# Patient Record
Sex: Female | Born: 1935 | Race: White | Hispanic: No | Smoking: Never smoker
Health system: Southern US, Community
[De-identification: ages and names within clinical notes are randomized; demographics above are authoritative.]

## PROBLEM LIST (undated history)

## (undated) DIAGNOSIS — K219 Gastro-esophageal reflux disease without esophagitis: Secondary | ICD-10-CM

## (undated) DIAGNOSIS — IMO0001 Reserved for inherently not codable concepts without codable children: Secondary | ICD-10-CM

## (undated) DIAGNOSIS — E119 Type 2 diabetes mellitus without complications: Secondary | ICD-10-CM

## (undated) DIAGNOSIS — M069 Rheumatoid arthritis, unspecified: Secondary | ICD-10-CM

## (undated) DIAGNOSIS — J189 Pneumonia, unspecified organism: Secondary | ICD-10-CM

## (undated) DIAGNOSIS — E785 Hyperlipidemia, unspecified: Secondary | ICD-10-CM

## (undated) DIAGNOSIS — J8410 Pulmonary fibrosis, unspecified: Secondary | ICD-10-CM

## (undated) DIAGNOSIS — R5381 Other malaise: Secondary | ICD-10-CM

## (undated) DIAGNOSIS — D649 Anemia, unspecified: Secondary | ICD-10-CM

## (undated) DIAGNOSIS — K5792 Diverticulitis of intestine, part unspecified, without perforation or abscess without bleeding: Secondary | ICD-10-CM

## (undated) DIAGNOSIS — I2510 Atherosclerotic heart disease of native coronary artery without angina pectoris: Secondary | ICD-10-CM

## (undated) DIAGNOSIS — I10 Essential (primary) hypertension: Secondary | ICD-10-CM

## (undated) DIAGNOSIS — I639 Cerebral infarction, unspecified: Secondary | ICD-10-CM

## (undated) DIAGNOSIS — I219 Acute myocardial infarction, unspecified: Secondary | ICD-10-CM

## (undated) DIAGNOSIS — I729 Aneurysm of unspecified site: Secondary | ICD-10-CM

## (undated) DIAGNOSIS — Z5189 Encounter for other specified aftercare: Secondary | ICD-10-CM

## (undated) HISTORY — DX: Pulmonary fibrosis, unspecified: J84.10

## (undated) HISTORY — PX: HERNIA REPAIR: SHX51

## (undated) HISTORY — PX: ABDOMINAL HYSTERECTOMY: SHX81

## (undated) HISTORY — DX: Diverticulitis of intestine, part unspecified, without perforation or abscess without bleeding: K57.92

## (undated) HISTORY — DX: Gastro-esophageal reflux disease without esophagitis: K21.9

## (undated) HISTORY — PX: BREAST REDUCTION SURGERY: SHX8

## (undated) HISTORY — DX: Type 2 diabetes mellitus without complications: E11.9

## (undated) HISTORY — PX: BREAST SURGERY: SHX581

## (undated) HISTORY — PX: CORONARY ANGIOPLASTY WITH STENT PLACEMENT: SHX49

## (undated) HISTORY — DX: Cerebral infarction, unspecified: I63.9

## (undated) HISTORY — DX: Other malaise: R53.81

## (undated) HISTORY — PX: APPENDECTOMY: SHX54

## (undated) HISTORY — PX: CATARACT EXTRACTION W/ INTRAOCULAR LENS  IMPLANT, BILATERAL: SHX1307

## (undated) HISTORY — PX: VESICOVAGINAL FISTULA CLOSURE W/ TAH: SUR271

## (undated) HISTORY — DX: Essential (primary) hypertension: I10

## (undated) HISTORY — PX: CARPAL TUNNEL RELEASE: SHX101

## (undated) HISTORY — DX: Atherosclerotic heart disease of native coronary artery without angina pectoris: I25.10

## (undated) HISTORY — DX: Rheumatoid arthritis, unspecified: M06.9

## (undated) HISTORY — PX: TONSILLECTOMY: SUR1361

## (undated) HISTORY — PX: CHOLECYSTECTOMY: SHX55

## (undated) HISTORY — DX: Hyperlipidemia, unspecified: E78.5

---

## 1996-09-28 HISTORY — PX: CORONARY ARTERY BYPASS GRAFT: SHX141

## 1997-11-27 ENCOUNTER — Encounter: Payer: Self-pay | Admitting: Cardiology

## 1999-11-24 ENCOUNTER — Encounter: Admission: RE | Admit: 1999-11-24 | Discharge: 1999-11-24 | Payer: Self-pay | Admitting: Family Medicine

## 1999-11-24 ENCOUNTER — Encounter: Payer: Self-pay | Admitting: Family Medicine

## 2000-05-04 ENCOUNTER — Ambulatory Visit (HOSPITAL_COMMUNITY): Admission: RE | Admit: 2000-05-04 | Discharge: 2000-05-04 | Payer: Self-pay | Admitting: Cardiology

## 2001-11-08 ENCOUNTER — Encounter: Payer: Self-pay | Admitting: Cardiology

## 2003-05-22 ENCOUNTER — Encounter: Payer: Self-pay | Admitting: *Deleted

## 2003-05-22 ENCOUNTER — Emergency Department (HOSPITAL_COMMUNITY): Admission: EM | Admit: 2003-05-22 | Discharge: 2003-05-22 | Payer: Self-pay | Admitting: *Deleted

## 2003-06-08 ENCOUNTER — Encounter: Payer: Self-pay | Admitting: Neurology

## 2003-06-08 ENCOUNTER — Encounter: Admission: RE | Admit: 2003-06-08 | Discharge: 2003-06-08 | Payer: Self-pay | Admitting: Neurology

## 2003-06-22 ENCOUNTER — Encounter: Payer: Self-pay | Admitting: Diagnostic Radiology

## 2003-06-22 ENCOUNTER — Encounter: Payer: Self-pay | Admitting: Neurology

## 2003-06-22 ENCOUNTER — Encounter: Admission: RE | Admit: 2003-06-22 | Discharge: 2003-06-22 | Payer: Self-pay | Admitting: Neurology

## 2003-07-10 ENCOUNTER — Encounter: Admission: RE | Admit: 2003-07-10 | Discharge: 2003-07-10 | Payer: Self-pay | Admitting: Neurology

## 2003-07-10 ENCOUNTER — Encounter: Payer: Self-pay | Admitting: Neurology

## 2004-05-03 ENCOUNTER — Inpatient Hospital Stay (HOSPITAL_COMMUNITY): Admission: EM | Admit: 2004-05-03 | Discharge: 2004-05-07 | Payer: Self-pay | Admitting: Emergency Medicine

## 2005-04-04 ENCOUNTER — Encounter: Admission: RE | Admit: 2005-04-04 | Discharge: 2005-04-04 | Payer: Self-pay | Admitting: Neurology

## 2005-05-19 ENCOUNTER — Ambulatory Visit (HOSPITAL_COMMUNITY): Admission: RE | Admit: 2005-05-19 | Discharge: 2005-05-19 | Payer: Self-pay | Admitting: Anesthesiology

## 2005-09-14 ENCOUNTER — Encounter: Admission: RE | Admit: 2005-09-14 | Discharge: 2005-09-14 | Payer: Self-pay | Admitting: Neurology

## 2006-09-09 ENCOUNTER — Encounter: Payer: Self-pay | Admitting: Cardiology

## 2006-09-09 ENCOUNTER — Encounter: Payer: Self-pay | Admitting: Critical Care Medicine

## 2006-09-28 DIAGNOSIS — K5792 Diverticulitis of intestine, part unspecified, without perforation or abscess without bleeding: Secondary | ICD-10-CM

## 2006-09-28 HISTORY — DX: Diverticulitis of intestine, part unspecified, without perforation or abscess without bleeding: K57.92

## 2008-03-02 ENCOUNTER — Encounter: Payer: Self-pay | Admitting: Critical Care Medicine

## 2008-03-13 ENCOUNTER — Telehealth: Payer: Self-pay | Admitting: Critical Care Medicine

## 2008-03-14 ENCOUNTER — Ambulatory Visit: Payer: Self-pay | Admitting: Critical Care Medicine

## 2008-03-14 DIAGNOSIS — M069 Rheumatoid arthritis, unspecified: Secondary | ICD-10-CM | POA: Insufficient documentation

## 2008-03-14 DIAGNOSIS — E785 Hyperlipidemia, unspecified: Secondary | ICD-10-CM

## 2008-03-14 DIAGNOSIS — J8410 Pulmonary fibrosis, unspecified: Secondary | ICD-10-CM | POA: Insufficient documentation

## 2008-03-14 DIAGNOSIS — I10 Essential (primary) hypertension: Secondary | ICD-10-CM | POA: Insufficient documentation

## 2008-03-14 DIAGNOSIS — I2510 Atherosclerotic heart disease of native coronary artery without angina pectoris: Secondary | ICD-10-CM | POA: Insufficient documentation

## 2008-03-16 ENCOUNTER — Ambulatory Visit: Payer: Self-pay | Admitting: Internal Medicine

## 2008-03-17 ENCOUNTER — Encounter: Payer: Self-pay | Admitting: Critical Care Medicine

## 2008-03-19 ENCOUNTER — Ambulatory Visit: Admission: RE | Admit: 2008-03-19 | Discharge: 2008-03-19 | Payer: Self-pay | Admitting: Critical Care Medicine

## 2008-03-19 ENCOUNTER — Telehealth (INDEPENDENT_AMBULATORY_CARE_PROVIDER_SITE_OTHER): Payer: Self-pay | Admitting: *Deleted

## 2008-03-19 ENCOUNTER — Telehealth: Payer: Self-pay | Admitting: Critical Care Medicine

## 2008-03-20 ENCOUNTER — Encounter: Payer: Self-pay | Admitting: Critical Care Medicine

## 2008-03-21 ENCOUNTER — Ambulatory Visit: Admission: RE | Admit: 2008-03-21 | Discharge: 2008-03-21 | Payer: Self-pay | Admitting: Critical Care Medicine

## 2008-03-21 ENCOUNTER — Ambulatory Visit: Payer: Self-pay | Admitting: Critical Care Medicine

## 2008-03-21 ENCOUNTER — Encounter: Payer: Self-pay | Admitting: Critical Care Medicine

## 2008-03-26 ENCOUNTER — Telehealth (INDEPENDENT_AMBULATORY_CARE_PROVIDER_SITE_OTHER): Payer: Self-pay | Admitting: *Deleted

## 2008-03-26 ENCOUNTER — Ambulatory Visit: Payer: Self-pay | Admitting: Critical Care Medicine

## 2008-03-29 ENCOUNTER — Ambulatory Visit: Payer: Self-pay | Admitting: Critical Care Medicine

## 2008-04-01 LAB — CONVERTED CEMR LAB: Angiotensin 1 Converting Enzyme: 25 units/L (ref 9–67)

## 2008-04-10 ENCOUNTER — Encounter: Payer: Self-pay | Admitting: Critical Care Medicine

## 2008-06-12 ENCOUNTER — Encounter: Payer: Self-pay | Admitting: Critical Care Medicine

## 2008-07-04 ENCOUNTER — Ambulatory Visit: Payer: Self-pay | Admitting: Critical Care Medicine

## 2008-07-04 DIAGNOSIS — K219 Gastro-esophageal reflux disease without esophagitis: Secondary | ICD-10-CM

## 2008-09-06 ENCOUNTER — Ambulatory Visit: Payer: Self-pay | Admitting: Critical Care Medicine

## 2008-10-11 ENCOUNTER — Encounter: Payer: Self-pay | Admitting: Critical Care Medicine

## 2009-01-24 ENCOUNTER — Ambulatory Visit: Payer: Self-pay | Admitting: Critical Care Medicine

## 2009-05-02 ENCOUNTER — Ambulatory Visit: Payer: Self-pay | Admitting: Critical Care Medicine

## 2009-06-06 ENCOUNTER — Ambulatory Visit (HOSPITAL_BASED_OUTPATIENT_CLINIC_OR_DEPARTMENT_OTHER): Admission: RE | Admit: 2009-06-06 | Discharge: 2009-06-06 | Payer: Self-pay | Admitting: Critical Care Medicine

## 2009-06-06 ENCOUNTER — Ambulatory Visit: Payer: Self-pay | Admitting: Critical Care Medicine

## 2009-06-06 ENCOUNTER — Ambulatory Visit: Payer: Self-pay | Admitting: Diagnostic Radiology

## 2009-06-06 DIAGNOSIS — J209 Acute bronchitis, unspecified: Secondary | ICD-10-CM

## 2009-07-04 ENCOUNTER — Ambulatory Visit: Payer: Self-pay | Admitting: Critical Care Medicine

## 2009-07-17 ENCOUNTER — Telehealth: Payer: Self-pay | Admitting: Critical Care Medicine

## 2009-07-19 ENCOUNTER — Emergency Department (HOSPITAL_BASED_OUTPATIENT_CLINIC_OR_DEPARTMENT_OTHER): Admission: EM | Admit: 2009-07-19 | Discharge: 2009-07-19 | Payer: Self-pay | Admitting: Emergency Medicine

## 2009-07-19 ENCOUNTER — Ambulatory Visit: Payer: Self-pay | Admitting: Radiology

## 2009-09-09 ENCOUNTER — Encounter: Admission: RE | Admit: 2009-09-09 | Discharge: 2009-09-09 | Payer: Self-pay | Admitting: Otolaryngology

## 2009-10-24 ENCOUNTER — Encounter: Payer: Self-pay | Admitting: Cardiology

## 2009-11-07 ENCOUNTER — Ambulatory Visit: Payer: Self-pay | Admitting: Critical Care Medicine

## 2010-01-07 ENCOUNTER — Emergency Department (HOSPITAL_BASED_OUTPATIENT_CLINIC_OR_DEPARTMENT_OTHER): Admission: EM | Admit: 2010-01-07 | Discharge: 2010-01-07 | Payer: Self-pay | Admitting: Emergency Medicine

## 2010-01-07 ENCOUNTER — Ambulatory Visit: Payer: Self-pay | Admitting: Radiology

## 2010-03-13 ENCOUNTER — Ambulatory Visit: Payer: Self-pay | Admitting: Critical Care Medicine

## 2010-03-13 DIAGNOSIS — E119 Type 2 diabetes mellitus without complications: Secondary | ICD-10-CM

## 2010-04-25 ENCOUNTER — Encounter: Admission: RE | Admit: 2010-04-25 | Discharge: 2010-04-25 | Payer: Self-pay | Admitting: Family Medicine

## 2010-04-25 DIAGNOSIS — I639 Cerebral infarction, unspecified: Secondary | ICD-10-CM

## 2010-04-25 HISTORY — DX: Cerebral infarction, unspecified: I63.9

## 2010-05-01 ENCOUNTER — Telehealth (INDEPENDENT_AMBULATORY_CARE_PROVIDER_SITE_OTHER): Payer: Self-pay | Admitting: *Deleted

## 2010-05-01 ENCOUNTER — Ambulatory Visit: Payer: Self-pay | Admitting: Critical Care Medicine

## 2010-05-01 DIAGNOSIS — I6789 Other cerebrovascular disease: Secondary | ICD-10-CM

## 2010-05-26 ENCOUNTER — Encounter: Payer: Self-pay | Admitting: Cardiology

## 2010-05-26 ENCOUNTER — Ambulatory Visit: Payer: Self-pay | Admitting: Cardiology

## 2010-06-11 ENCOUNTER — Encounter: Payer: Self-pay | Admitting: Cardiology

## 2010-06-11 ENCOUNTER — Ambulatory Visit (HOSPITAL_COMMUNITY): Admission: RE | Admit: 2010-06-11 | Discharge: 2010-06-11 | Payer: Self-pay | Admitting: Cardiology

## 2010-06-11 ENCOUNTER — Ambulatory Visit: Payer: Self-pay | Admitting: Cardiology

## 2010-07-03 ENCOUNTER — Ambulatory Visit: Payer: Self-pay | Admitting: Critical Care Medicine

## 2010-07-24 ENCOUNTER — Encounter: Payer: Self-pay | Admitting: Critical Care Medicine

## 2010-09-01 ENCOUNTER — Encounter: Payer: Self-pay | Admitting: Critical Care Medicine

## 2010-10-02 ENCOUNTER — Ambulatory Visit
Admission: RE | Admit: 2010-10-02 | Discharge: 2010-10-02 | Payer: Self-pay | Source: Home / Self Care | Attending: Critical Care Medicine | Admitting: Critical Care Medicine

## 2010-10-28 DIAGNOSIS — I219 Acute myocardial infarction, unspecified: Secondary | ICD-10-CM | POA: Insufficient documentation

## 2010-10-28 DIAGNOSIS — M129 Arthropathy, unspecified: Secondary | ICD-10-CM | POA: Insufficient documentation

## 2010-10-29 ENCOUNTER — Ambulatory Visit (INDEPENDENT_AMBULATORY_CARE_PROVIDER_SITE_OTHER): Payer: Medicare Other | Admitting: Cardiology

## 2010-10-29 ENCOUNTER — Encounter: Payer: Self-pay | Admitting: Cardiology

## 2010-10-29 ENCOUNTER — Ambulatory Visit: Admit: 2010-10-29 | Payer: Self-pay | Admitting: Cardiology

## 2010-10-29 DIAGNOSIS — I2510 Atherosclerotic heart disease of native coronary artery without angina pectoris: Secondary | ICD-10-CM

## 2010-10-29 DIAGNOSIS — I10 Essential (primary) hypertension: Secondary | ICD-10-CM

## 2010-10-30 ENCOUNTER — Encounter (INDEPENDENT_AMBULATORY_CARE_PROVIDER_SITE_OTHER): Payer: Self-pay | Admitting: *Deleted

## 2010-10-30 ENCOUNTER — Telehealth (INDEPENDENT_AMBULATORY_CARE_PROVIDER_SITE_OTHER): Payer: Self-pay | Admitting: *Deleted

## 2010-10-30 ENCOUNTER — Encounter: Payer: Self-pay | Admitting: Cardiology

## 2010-10-30 LAB — CONVERTED CEMR LAB
HDL: 45 mg/dL (ref >39)
Indirect Bilirubin: 0.4 mg/dL (ref 0.0–0.9)
LDL Cholesterol: 66 mg/dL (ref 0–99)
Total Bilirubin: 0.5 mg/dL (ref 0.3–1.2)
Total CHOL/HDL Ratio: 3.2
Total Protein: 7.2 g/dL (ref 6.0–8.3)
VLDL: 33 mg/dL (ref 0–40)

## 2010-10-30 NOTE — Assessment & Plan Note (Signed)
Summary: Pulmonary OV   Copy to:  Zimenski: Rheumatology Primary Provider/Referring Provider:  Thomasene Mohair  CC:  4 month follow up.  Pt states breathing is "very difficult."  States she is having increased SOB with activity-this is worse since last OV.  Also states she does have a cough-sometimes prod with white mucus.  Denies wheezing and chest tightness.  Marland Kitchen  History of Present Illness: Pulmonary  F/U:   Re: pulmonary infiltrates due to RA  [name] This pt was originally seen in consultation on 03/14/08 for interstital changes on CT chest  in setting of RA.  Has RA induced interstitial lung disease. Past Hx pertinent for severe Rheumatoid arthritis Dx 9yrs ago.  Rx with Leflunamide.  Not on methotrexate.     March 13, 2010 9:34 AM The pt feels she is worsening.  There is  more difficulty breathing and coughing more. The cough is productive of minimal white mucus.  The pt hurts all the time in all the joints.  The joints are swelling more.  The pt did benefit from embrel but could not afford.  The pt is more fatigued.  When the pt  gets up the pt  is tired.  No real chest pain.  Mucus is minimal.  Arthritis is worse.  Hurts all the time.  Joints swell. Was on embrel but could not afford.   Preventive Screening-Counseling & Management  Alcohol-Tobacco     Smoking Status: never  Current Medications (verified): 1)  Metoprolol Succinate 50 Mg  Tb24 (Metoprolol Succinate) .... Once Daily 2)  Plavix 75 Mg  Tabs (Clopidogrel Bisulfate) .... Once Daily 3)  Adult Aspirin Low Strength 81 Mg  Tbdp (Aspirin) .... Once Daily 4)  Leflunomide 20 Mg  Tabs (Leflunomide) .... Once Daily 5)  Crestor 20 Mg  Tabs (Rosuvastatin Calcium) .... Once Daily 6)  Centrum Silver   Chew (Multiple Vitamins-Minerals) .... Once Daily 7)  Fish Oil   Oil (Fish Oil) .... Once Daily 8)  Symbicort 160-4.5 Mcg/act  Aero (Budesonide-Formoterol Fumarate) .... Two Puffs Twice Daily 9)  Glucophage 500 Mg Tabs (Metformin Hcl)  .... 2 Tablets Two Times A Day  Allergies (verified): No Known Drug Allergies  Past History:  Past medical, surgical, family and social histories (including risk factors) reviewed, and no changes noted (except as noted below).  Past Medical History: Reviewed history from 07/04/2008 and no changes required. Coronary Heart Disease Hypertension Hyperlipidemia Rheumatoid Arthritis on Arava.  Dx for 37yrs.  pulmonary fibrosis due to RA    -TLC 79%  DLCO 93% 2009  Past Surgical History: Reviewed history from 03/14/2008 and no changes required. Breast reduction gallbladder appendectomy C A B G:1998 cardiac stents hernia repair umbilical  Family History: Reviewed history from 03/14/2008 and no changes required. heart disease--mother father 5 sisters 5 brothers  Social History: Reviewed history from 03/14/2008 and no changes required. widowed children retired from [employer]  Review of Systems       The patient complains of shortness of breath with activity, productive cough, and non-productive cough.  The patient denies shortness of breath at rest, coughing up blood, chest pain, irregular heartbeats, acid heartburn, indigestion, loss of appetite, weight change, abdominal pain, difficulty swallowing, sore throat, tooth/dental problems, headaches, nasal congestion/difficulty breathing through nose, sneezing, itching, ear ache, anxiety, depression, hand/feet swelling, joint stiffness or pain, rash, change in color of mucus, and fever.    Vital Signs:  Patient profile:   77 year old female Height:  60 inches Weight:      133 pounds BMI:     26.07 O2 Sat:      95 % on Room air Temp:     97.9 degrees F oral Pulse rate:   70 / minute BP sitting:   118 / 70  (left arm) Cuff size:   regular  Vitals Entered By: Gweneth Dimitri RN (March 13, 2010 9:22 AM)  O2 Flow:  Room air  CC: 4 month follow up.  Pt states breathing is "very difficult."  States she is having increased  SOB with activity-this is worse since last OV.  Also states she does have a cough-sometimes prod with white mucus.  Denies wheezing and chest tightness.   Comments Medications reviewed with patient Daytime contact number verified with patient. Gweneth Dimitri RN  March 13, 2010 9:24 AM    Physical Exam  Additional Exam:  Gen: WD WN     WF     in NAD    NCAT Heent:  no jvd, no TMG, no cervical LNademopathy, orophyx clear,  nares with clear watery drainage. Cor: RRR nl s1/s2  no s3/s4  no m r h g Abd: soft NT BSA   no masses  No HSM  no rebound or guarding Ext perfused with no c v e v.d Neuro: intact, moves all 4s, CN II-XII intact, DTRs intact Chest:   no wheezes, no  rhonchi   no egophony  no consolidative breath sounds,  progressive bibasilar dry rales , exp wheeze  Skin: clear  Genital/Rectal :deferred    Impression & Recommendations:  Problem # 1:  PULMONARY FIBROSIS, POSTINFLAMMATORY (ICD-515) Assessment Deteriorated progressive pulmonary fibrosis due to RA, failed symbicort,  cannot afford embrel plan Start prednisone 10mg  4 each am x 4 days, 3 x 4 days, 2 x 4 days, 1 x 4 days then stop When current symbicort runs out, trial Dulera 200 two puff twice daily Return 6 weeks [place]  Medications Added to Medication List This Visit: 1)  Dulera 200-5 Mcg/act Aero (Mometasone furo-formoterol fum) .... 2 puffs twice per day 2)  Glucophage 500 Mg Tabs (Metformin hcl) .... 2 tablets two times a day 3)  Prednisone 10 Mg Tabs (Prednisone) .... Take as directed 4 each am x 4 days, 3 x 4 days, 2 x 4 days, 1 x 4 days then stop  Complete Medication List: 1)  Metoprolol Succinate 50 Mg Tb24 (Metoprolol succinate) .... Once daily 2)  Plavix 75 Mg Tabs (Clopidogrel bisulfate) .... Once daily 3)  Adult Aspirin Low Strength 81 Mg Tbdp (Aspirin) .... Once daily 4)  Leflunomide 20 Mg Tabs (Leflunomide) .... Once daily 5)  Crestor 20 Mg Tabs (Rosuvastatin calcium) .... Once daily 6)  Centrum  Silver Chew (Multiple vitamins-minerals) .... Once daily 7)  Fish Oil Oil (Fish oil) .... Once daily 8)  Dulera 200-5 Mcg/act Aero (Mometasone furo-formoterol fum) .... 2 puffs twice per day 9)  Glucophage 500 Mg Tabs (Metformin hcl) .... 2 tablets two times a day 10)  Prednisone 10 Mg Tabs (Prednisone) .... Take as directed 4 each am x 4 days, 3 x 4 days, 2 x 4 days, 1 x 4 days then stop  Other Orders: Est. Patient Level IV (62130)  Patient Instructions: 1)  Start prednisone 10mg  4 each am x 4 days, 3 x 4 days, 2 x 4 days, 1 x 4 days then stop 2)  When current symbicort runs out, trial Dulera 200 two puff twice daily 3)  Return 6 weeks [place] Prescriptions: PREDNISONE 10 MG  TABS (PREDNISONE) Take as directed 4 each am x 4 days, 3 x 4 days, 2 x 4 days, 1 x 4 days then stop  #40 x 0   Entered and Authorized by:   Storm Frisk MD   Signed by:   Storm Frisk MD on 03/13/2010   Method used:   Electronically to        [facility] [id number] (retail)       [address]       [place], [place]  [address]       Ph: [phone] or [phone]       Fax: [phone]   RxID:   [id number]     Appended Document: Pulmonary OV fax Miguel Aschoff,  Dr Janetta Hora

## 2010-10-30 NOTE — Assessment & Plan Note (Signed)
Summary: Pulmonary OV   Copy to:  Peter [place] Primary Provider/Referring Provider:  Thomasene Mohair  CC:  4 month follow up.  c/o "having a hard time taking a full breath" x1wk.  also having cough with white mucus and runny nose x1wk.  Marland Kitchen  History of Present Illness: Pulmonary  F/U:   Re: pulmonary infiltrates due to RA  [name] This pt was originally seen in consultation on 03/14/08 for interstital changes on CT chest  in setting of RA.  Has RA induced interstitial lung disease. Past Hx pertinent for severe Rheumatoid arthritis Dx 44yrs ago.  Rx with Leflunamide.  Not on methotrexate.    November 07, 2009 10:20 AM Still with the cough and pndrip. Dyspnea is the same.   No chest pain.   Cough is dry mostly, occ prod of mucous and is white.  The symbicort has helped. The arthritis is doing poorly on the [place].   Hips , feet and knees.      Preventive Screening-Counseling & Management  Alcohol-Tobacco     Smoking Status: never  Current Medications (verified): 1)  Metoprolol Succinate 50 Mg  Tb24 (Metoprolol Succinate) .... Once Daily 2)  Plavix 75 Mg  Tabs (Clopidogrel Bisulfate) .... Once Daily 3)  Adult Aspirin Low Strength 81 Mg  Tbdp (Aspirin) .... Once Daily 4)  Leflunomide 20 Mg  Tabs (Leflunomide) .... Once Daily 5)  Crestor 20 Mg  Tabs (Rosuvastatin Calcium) .... Once Daily 6)  Centrum Silver   Chew (Multiple Vitamins-Minerals) .... Once Daily 7)  Fish Oil   Oil (Fish Oil) .... Once Daily 8)  Symbicort 160-4.5 Mcg/act  Aero (Budesonide-Formoterol Fumarate) .... Two Puffs Twice Daily  Allergies (verified): No Known Drug Allergies  Past History:  Past medical, surgical, family and social histories (including risk factors) reviewed, and no changes noted (except as noted below).  Past Medical History: Reviewed history from 07/04/2008 and no changes required. Coronary Heart Disease Hypertension Hyperlipidemia Rheumatoid Arthritis on Arava.  Dx for 64yrs.  pulmonary  fibrosis due to RA    -TLC 79%  DLCO 93% 2009  Past Surgical History: Reviewed history from 03/14/2008 and no changes required. Breast reduction gallbladder appendectomy C A B G:1998 cardiac stents hernia repair umbilical  Family History: Reviewed history from 03/14/2008 and no changes required. heart disease--mother father 5 sisters 5 brothers  Social History: Reviewed history from 03/14/2008 and no changes required. widowed children retired from [employer]Smoking Status:  never  Review of Systems       The patient complains of shortness of breath with activity, non-productive cough, and nasal congestion/difficulty breathing through nose.  The patient denies shortness of breath at rest, productive cough, coughing up blood, chest pain, irregular heartbeats, acid heartburn, indigestion, loss of appetite, weight change, abdominal pain, difficulty swallowing, sore throat, tooth/dental problems, headaches, sneezing, itching, ear ache, anxiety, depression, hand/feet swelling, joint stiffness or pain, rash, change in color of mucus, and fever.    Vital Signs:  Patient profile:   77 year old female Height:      60 inches Weight:      138 pounds BMI:     27.05 O2 Sat:      93 % on Room air Temp:     98.1 degrees F oral Pulse rate:   88 / minute BP sitting:   132 / 78  (left arm) Cuff size:   regular  Vitals Entered By: Gweneth Dimitri RN (November 07, 2009 10:12 AM)  O2  Flow:  Room air CC: 4 month follow up.  c/o "having a hard time taking a full breath" x1wk.  also having cough with white mucus and runny nose x1wk.   Comments Medications reviewed with patient Daytime contact number verified with patient. Crystal Jones RN  November 07, 2009 10:13 AM    Physical Exam  Additional Exam:  Gen: WD WN     WF     in NAD    NCAT Heent:  no jvd, no TMG, no cervical LNademopathy, orophyx clear,  nares with clear watery drainage. Cor: RRR nl s1/s2  no s3/s4  no m r h g Abd: soft  NT BSA   no masses  No HSM  no rebound or guarding Ext perfused with no c v e v.d Neuro: intact, moves all 4s, CN II-XII intact, DTRs intact Chest:   no wheezes, bibasilar dry  rales, rhonchi   no egophony  no consolidative breath sounds, Skin: clear  Genital/Rectal :deferred    Impression & Recommendations:  Problem # 1:  PULMONARY FIBROSIS, POSTINFLAMMATORY (ICD-515) Assessment Unchanged  Pulmonary fibrosis primarily peripheral involvement,  TBBX insufficient to give DX.  PFTs show peripheral airflow obstruction. TLC and DLCO well preserved and stable, improved with laba/ics  Plan: no further prednisone ok to continue ARAVA. cont laba/ics symbicort  Complete Medication List: 1)  Metoprolol Succinate 50 Mg Tb24 (Metoprolol succinate) .... Once daily 2)  Plavix 75 Mg Tabs (Clopidogrel bisulfate) .... Once daily 3)  Adult Aspirin Low Strength 81 Mg Tbdp (Aspirin) .... Once daily 4)  Leflunomide 20 Mg Tabs (Leflunomide) .... Once daily 5)  Crestor 20 Mg Tabs (Rosuvastatin calcium) .... Once daily 6)  Centrum Silver Chew (Multiple vitamins-minerals) .... Once daily 7)  Fish Oil Oil (Fish oil) .... Once daily 8)  Symbicort 160-4.5 Mcg/act Aero (Budesonide-formoterol fumarate) .... Two puffs twice daily  Other Orders: Est. Patient Level III (16109)  Patient Instructions: 1)  No change in medications 2)  Return 4 months  Appended Document: Pulmonary OV fax allan ross

## 2010-10-30 NOTE — Assessment & Plan Note (Signed)
Summary: Pulmonary OV   Copy to:  Zimenski: Rheumatology Primary Provider/Referring Provider:  Thomasene Mohair  CC:  2 month follow up.  Pt states breathing is "pretty good most of the time" but will occasionally have a hard time getting a deep breath.  Cough - occ prod with creamy mucus.  Denies wheezing and chest tightness.  Needs refills for dulera.  History of Present Illness: Pulmonary  F/U:   Re: pulmonary infiltrates due to RA  [name] This pt was originally seen in consultation on 03/14/08 for interstital changes on CT chest  in setting of RA.  Has RA induced interstitial lung disease. Past Hx pertinent for severe Rheumatoid arthritis Dx 23yrs ago.  Rx with Leflunamide.  Not on methotrexate.     March 13, 2010 9:34 AM The pt feels she is worsening.  There is  more difficulty breathing and coughing more. The cough is productive of minimal white mucus.  The pt hurts all the time in all the joints.  The joints are swelling more.  The pt did benefit from embrel but could not afford.  The pt is more fatigued.  When the pt  gets up the pt  is tired.  No real chest pain.  Mucus is minimal.  Arthritis is worse.  Hurts all the time.  Joints swell. Was on embrel but could not afford.   May 01, 2010 10:21 AM Developed a sinus infection,  then developed a stroke:  confusion, memory issues, disequilibrium. Was not hospitalized.  Two weeks went by and saw PCP and he Dx this.  Had an MRI of the head. Pt is is still on an Abx for the sinuses:  cough, nose is draining and runs, voice is lost.  Cough is productive whitish yellow.   Is now off ASA and plavix and is on new med for CVA.  Pt to call back with the names. Pt is on Dulera and this helps the breathing better than the symbicort did.   July 03, 2010 10:44 AM The dry cough is a major issue. The pts  eyes have improved.  no more sinus infxn.  The pt  still gets dizzy, falls easily. Pt has a cane but does not use the cane.   Still on dulera and  this has helped    Current Medications (verified): 1)  Metoprolol Succinate 50 Mg  Tb24 (Metoprolol Succinate) .... Take 1 Tablet By Mouth Two Times A Day 2)  Leflunomide 20 Mg  Tabs (Leflunomide) .... Once Daily 3)  Crestor 20 Mg  Tabs (Rosuvastatin Calcium) .... Once Daily 4)  Centrum Silver   Chew (Multiple Vitamins-Minerals) .... Once Daily 5)  Fish Oil 1200 Mg Caps (Omega-3 Fatty Acids) .... Take 1 Capsule By Mouth Once A Day 6)  Dulera 200-5 Mcg/act Aero (Mometasone Furo-Formoterol Fum) .... 2 Puffs Twice Per Day 7)  Glucophage 500 Mg Tabs (Metformin Hcl) .Marland Kitchen.. 1-2 Tablets Two Times A Day 8)  Aggrenox 25-200 Mg Xr12h-Cap (Aspirin-Dipyridamole) .... Take 1 Tablet By Mouth Two Times A Day 9)  Synthroid 50 Mcg Tabs (Levothyroxine Sodium) .... Take 1 Tab By Mouth Once A Day Each Morning On An Empty Stomach.  Allergies (verified): 1)  Augmentin  Past History:  Past medical, surgical, family and social histories (including risk factors) reviewed, and no changes noted (except as noted below).  Past Medical History: Reviewed history from 05/01/2010 and no changes required. Coronary Heart Disease Hypertension Hyperlipidemia Rheumatoid Arthritis on Arava.  Dx for 39yrs.  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 Breast reduction gallbladder appendectomy C A B G:1998 cardiac stents hernia repair umbilical  Family History: Reviewed history from 03/14/2008 and no changes required. heart disease--mother father 5 sisters 5 brothers  Social History: Reviewed history from 05/01/2010 and no changes required. widowed children retired from [employer] Never smoked  Review of Systems       The patient complains of shortness of breath with activity and non-productive cough.  The patient denies shortness of breath at rest,  productive cough, coughing up blood, chest pain, irregular heartbeats, acid heartburn, indigestion, loss of appetite, weight change, abdominal pain, difficulty swallowing, sore throat, tooth/dental problems, headaches, nasal congestion/difficulty breathing through nose, sneezing, itching, ear ache, anxiety, depression, hand/feet swelling, joint stiffness or pain, rash, change in color of mucus, and fever.    Vital Signs:  Patient profile:   77 year old female Height:      60 inches Weight:      122 pounds BMI:     23.91 O2 Sat:      97 % on Room air Temp:     97.9 degrees F oral Pulse rate:   73 / minute BP sitting:   120 / 80  (left arm) Cuff size:   regular  Vitals Entered By: Gweneth Dimitri RN (July 03, 2010 10:21 AM)  O2 Flow:  Room air CC: 2 month follow up.  Pt states breathing is "pretty good most of the time" but will occasionally have a hard time getting a deep breath.  Cough - occ prod with creamy mucus.  Denies wheezing and chest tightness.  Needs refills for dulera Comments Medications reviewed with patient Daytime contact number verified with patient. Crystal Jones RN  July 03, 2010 10:22 AM    Physical Exam  Additional Exam:  Gen: WD WN     WF     in NAD    NCAT Heent:  no jvd, no TMG, no cervical LNademopathy, orophyx clear,  nares with clear watery drainage. Cor: RRR nl s1/s2  no s3/s4  no m r h g Abd: soft NT BSA   no masses  No HSM  no rebound or guarding Ext perfused with no c v e v.d Neuro: intact, moves all 4s, CN II-XII intact, DTRs intact Chest:   no wheezes, no  rhonchi   no egophony  no consolidative breath sounds,  no change dry rales Skin: clear  Genital/Rectal :deferred    Impression & Recommendations:  Problem # 1:  PULMONARY FIBROSIS, POSTINFLAMMATORY (ICD-515) Assessment Unchanged  stable pulm fibrosis on basis of RA plan stay off pred cont leflunimide cont dulera for bronchodilation  Medications Added to Medication List This  Visit: 1)  Metoprolol Succinate 50 Mg Tb24 (Metoprolol succinate) .... Take 1 tablet by mouth two times a day 2)  Fish Oil 1200 Mg Caps (Omega-3 fatty acids) .... Take 1 capsule by mouth once a day  Complete Medication List: 1)  Metoprolol Succinate 50 Mg Tb24 (Metoprolol succinate) .... Take 1 tablet by mouth two times a day 2)  Leflunomide 20 Mg Tabs (Leflunomide) .... Once daily 3)  Crestor 20 Mg Tabs (Rosuvastatin calcium) .... Once daily 4)  Centrum Silver Chew (Multiple vitamins-minerals) .... Once daily 5)  Fish Oil  1200 Mg Caps (Omega-3 fatty acids) .... Take 1 capsule by mouth once a day 6)  Dulera 200-5 Mcg/act Aero (Mometasone furo-formoterol fum) .... 2 puffs twice per day 7)  Glucophage 500 Mg Tabs (Metformin hcl) .Marland Kitchen.. 1-2 tablets two times a day 8)  Aggrenox 25-200 Mg Xr12h-cap (Aspirin-dipyridamole) .... Take 1 tablet by mouth two times a day 9)  Synthroid 50 Mcg Tabs (Levothyroxine sodium) .... Take 1 tab by mouth once a day each morning on an empty stomach.  Patient Instructions: 1)  No change in medications 2)  Return in     3     months Prescriptions: DULERA 200-5 MCG/ACT AERO (MOMETASONE FURO-FORMOTEROL FUM) 2 puffs twice per day  #1 x 6   Entered and Authorized by:   Storm Frisk MD   Signed by:   Storm Frisk MD on 07/03/2010   Method used:   Electronically to        [facility] (retail)       [address]       [place], [place]  [address]       Ph: [phone] or [phone]       Fax: [phone]   RxID:   [id number]    Immunization History:  Influenza Immunization History:    Influenza:  historical (06/27/2010)   Prevention & Chronic Care Immunizations   Influenza vaccine: Historical  (06/27/2010)    Tetanus booster: Not documented    Pneumococcal vaccine: Pneumovax  (07/04/2009)    H. zoster vaccine: Not documented  Colorectal Screening   Hemoccult: Not documented    Colonoscopy: Not  documented  Other Screening   Pap smear: Not documented    Mammogram: Not documented    DXA bone density scan: Not documented   Smoking status: never  (05/01/2010)  Diabetes Mellitus   HgbA1C: Not documented    Eye exam: Not documented    Foot exam: Not documented   High risk foot: Not documented   Foot care education: Not documented    Urine microalbumin/creatinine ratio: Not documented  Lipids   Total Cholesterol: Not documented   LDL: Not documented   LDL Direct: Not documented   HDL: Not documented   Triglycerides: Not documented    SGOT (AST): Not documented   SGPT (ALT): Not documented   Alkaline phosphatase: Not documented   Total bilirubin: Not documented  Hypertension   Last Blood Pressure: 120 / 80  (07/03/2010)   Serum creatinine: Not documented   Serum potassium Not documented  Self-Management Support :    Diabetes self-management support: Not documented    Hypertension self-management support: Not documented    Lipid self-management support: Not documented    Nursing Instructions: Give Flu vaccine today   Appended Document: Pulmonary OV fax C Miguel Aschoff,  Dr Janetta Hora

## 2010-10-30 NOTE — Assessment & Plan Note (Signed)
Summary: Pulmonary OV   Copy to:  Zimenski: Rheumatology Primary Provider/Referring Provider:  Thomasene Mohair  CC:  3 month follow up.  Pt c/o prod cough with off white mucus.  Denies SOB unless walking a long distance, wheezing, and chest tightness.  Marland Kitchen  History of Present Illness: Pulmonary  F/U:      [name] with  interstital changes on CT chest  in setting of RA.  Has RA induced interstitial lung disease. Past Hx pertinent for severe Rheumatoid arthritis Dx 52yrs ago.  Rx with Leflunamide.  Not on methotrexate.     March 13, 2010 9:34 AM The pt feels she is worsening.  There is  more difficulty breathing and coughing more. The cough is productive of minimal white mucus.  The pt hurts all the time in all the joints.  The joints are swelling more.  The pt did benefit from embrel but could not afford.  The pt is more fatigued.  When the pt  gets up the pt  is tired.  No real chest pain.  Mucus is minimal.  Arthritis is worse.  Hurts all the time.  Joints swell. Was on embrel but could not afford.   May 01, 2010 10:21 AM Developed a sinus infection,  then developed a stroke:  confusion, memory issues, disequilibrium. Was not hospitalized.  Two weeks went by and saw PCP and he Dx this.  Had an MRI of the head. Pt is is still on an Abx for the sinuses:  cough, nose is draining and runs, voice is lost.  Cough is productive whitish yellow.   Is now off ASA and plavix and is on new med for CVA.  Pt to call back with the names. Pt is on Dulera and this helps the breathing better than the symbicort did.   July 03, 2010 10:44 AM The dry cough is a major issue. The pts  eyes have improved.  no more sinus infxn.  The pt  still gets dizzy, falls easily. Pt has a cane but does not use the cane.   Still on dulera and this has helped  October 02, 2010 12:14 PM The dulera helps but the pt cannot afford.  No new issues. No cough.  No excess mucus. No wheeze. symbicort does not work as well. Pt denies  any significant sore throat, nasal congestion or excess secretions, fever, chills, sweats, unintended weight loss, pleurtic or exertional chest pain, orthopnea PND, or leg swelling Pt denies any increase in rescue therapy over baseline, denies waking up needing it or having any early am or nocturnal exacerbations of coughing/wheezing/or dyspnea.   Current Medications (verified): 1)  Metoprolol Succinate 50 Mg  Tb24 (Metoprolol Succinate) .... Take 1 Tablet By Mouth Two Times A Day 2)  Leflunomide 20 Mg  Tabs (Leflunomide) .... Once Daily 3)  Crestor 20 Mg  Tabs (Rosuvastatin Calcium) .... Once Daily 4)  Centrum Silver   Chew (Multiple Vitamins-Minerals) .... Once Daily 5)  Fish Oil 1200 Mg Caps (Omega-3 Fatty Acids) .... Take 1 Capsule By Mouth Once A Day 6)  Dulera 200-5 Mcg/act Aero (Mometasone Furo-Formoterol Fum) .... 2 Puffs Twice Per Day 7)  Glucophage 500 Mg Tabs (Metformin Hcl) .Marland Kitchen.. 1-2 Tablets Two Times A Day 8)  Aggrenox 25-200 Mg Xr12h-Cap (Aspirin-Dipyridamole) .... Take 1 Tablet By Mouth Two Times A Day 9)  Synthroid 50 Mcg Tabs (Levothyroxine Sodium) .... Take 1 Tab By Mouth Once A Day Each Morning On  An Empty Stomach. 10)  Tylenol Arthritis Pain 650 Mg Cr-Tabs (Acetaminophen) .... As Needed 11)  Imodium A-D 2 Mg Tabs (Loperamide Hcl) .... As Needed  Allergies (verified): 1)  Augmentin  Past History:  Past medical, surgical, family and social histories (including risk factors) reviewed, and no changes noted (except as noted below).  Past Medical History: Reviewed history from 05/01/2010 and no changes required. Coronary Heart Disease Hypertension Hyperlipidemia Rheumatoid Arthritis on Arava.  Dx for 31yrs.  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 Breast reduction gallbladder appendectomy C A B G:1998 cardiac  stents hernia repair umbilical  Family History: Reviewed history from 03/14/2008 and no changes required. heart disease--mother father 5 sisters 5 brothers  Social History: Reviewed history from 05/01/2010 and no changes required. widowed children retired from [employer] Never smoked  Review of Systems       The patient complains of shortness of breath with activity and non-productive cough.  The patient denies shortness of breath at rest, productive cough, coughing up blood, chest pain, irregular heartbeats, acid heartburn, indigestion, loss of appetite, weight change, abdominal pain, difficulty swallowing, sore throat, tooth/dental problems, headaches, nasal congestion/difficulty breathing through nose, sneezing, itching, ear ache, anxiety, depression, hand/feet swelling, joint stiffness or pain, rash, change in color of mucus, and fever.    Vital Signs:  Patient profile:   77 year old female Height:      60 inches Weight:      122 pounds BMI:     23.91 O2 Sat:      96 % on Room air Temp:     98.2 degrees F oral Pulse rate:   67 / minute BP sitting:   132 / 76  (right arm) Cuff size:   regular  Vitals Entered By: Gweneth Dimitri RN (October 02, 2010 11:55 AM)  O2 Flow:  Room air CC: 3 month follow up.  Pt c/o prod cough with off white mucus.  Denies SOB unless walking a long distance, wheezing, chest tightness.   Comments Medications reviewed with patient Daytime contact number verified with patient. Crystal Jones RN  October 02, 2010 11:55 AM    Physical Exam  Additional Exam:  Gen: WD WN     WF     in NAD    NCAT Heent:  no jvd, no TMG, no cervical LNademopathy, orophyx clear,  nares with clear watery drainage. Cor: RRR nl s1/s2  no s3/s4  no m r h g Abd: soft NT BSA   no masses  No HSM  no rebound or guarding Ext perfused with no c v e v.d Neuro: intact, moves all 4s, CN II-XII intact, DTRs intact Chest:   no wheezes, no  rhonchi   no egophony  no consolidative  breath sounds,  no change dry rales Skin: clear  Genital/Rectal :deferred    Impression & Recommendations:  Problem # 1:  PULMONARY FIBROSIS, POSTINFLAMMATORY (ICD-515) Assessment Unchanged  stable pulm fibrosis on basis of RA plan  cont dulera for bronchodilation>>>samples given  Medications Added to Medication List This Visit: 1)  Tylenol Arthritis Pain 650 Mg Cr-tabs (Acetaminophen) .... As needed 2)  Imodium A-d 2 Mg Tabs (Loperamide hcl) .... As needed  Complete Medication List: 1)  Metoprolol Succinate 50 Mg Tb24 (Metoprolol succinate) .... Take 1  tablet by mouth two times a day 2)  Leflunomide 20 Mg Tabs (Leflunomide) .... Once daily 3)  Crestor 20 Mg Tabs (Rosuvastatin calcium) .... Once daily 4)  Centrum Silver Chew (Multiple vitamins-minerals) .... Once daily 5)  Fish Oil 1200 Mg Caps (Omega-3 fatty acids) .... Take 1 capsule by mouth once a day 6)  Dulera 200-5 Mcg/act Aero (Mometasone furo-formoterol fum) .... 2 puffs twice per day 7)  Aggrenox 25-200 Mg Xr12h-cap (Aspirin-dipyridamole) .... Take 1 tablet by mouth two times a day 8)  Synthroid 50 Mcg Tabs (Levothyroxine sodium) .... Take 1 tab by mouth once a day each morning on an empty stomach. 9)  Tylenol Arthritis Pain 650 Mg Cr-tabs (Acetaminophen) .... As needed 10)  Imodium A-d 2 Mg Tabs (Loperamide hcl) .... As needed  Other Orders: Est. Patient Level III (16109)  Patient Instructions: 1)  No change in medications 2)  Return in    4      months  Appended Document: Pulmonary OV fax C allan ross

## 2010-10-30 NOTE — Progress Notes (Signed)
Summary: new meds  Phone Note Call from Patient   Caller: Patient Call For: WRIGHT Reason for Call: Talk to Nurse Summary of Call: Pt was seen today and forgot meds.  These are new meds she is taking - Augmentin 875/125 mg, Aggrenox 25-200mg  & Synthroid . Initial call taken by: Eugene Gavia,  May 01, 2010 11:05 AM  Follow-up for Phone Call        called and spoke with pt.  pt states she was just seen today by PW and forgot to add these meds to her med list.  I verified names of meds, dosage and directions and added meds to her list.  Will forward message to PW so he is aware of these new meds.  Aundra Millet Reynolds LPN  May 01, 2010 11:14 AM     New/Updated Medications: AUGMENTIN 875-125 MG TABS (AMOXICILLIN-POT CLAVULANATE) take 1 tab by mouth every 12 hours with food AGGRENOX 25-200 MG XR12H-CAP (ASPIRIN-DIPYRIDAMOLE) Take 1 tablet by mouth two times a day SYNTHROID 50 MCG TABS (LEVOTHYROXINE SODIUM) take 1 tab by mouth once a day each morning on an empty stomach.  Appended Document: new meds noted and thanks

## 2010-10-30 NOTE — Assessment & Plan Note (Signed)
Summary: Pulmonary OV   Copy to:  Zimenski: Rheumatology Primary Provider/Referring Provider:  Thomasene Mohair  CC:  6 wk follow up.  Pt is currently being treated for sinus infection.  States breathing is not the best but relates this to the sinus infection-having difficulty breathing mainly at rest.  States she is also cough "a whole lot."  Cough is prod with light yellow mucus. .  History of Present Illness: Pulmonary  F/U:   Re: pulmonary infiltrates due to RA  [name] This pt was originally seen in consultation on 03/14/08 for interstital changes on CT chest  in setting of RA.  Has RA induced interstitial lung disease. Past Hx pertinent for severe Rheumatoid arthritis Dx 49yrs ago.  Rx with Leflunamide.  Not on methotrexate.     March 13, 2010 9:34 AM The pt feels she is worsening.  There is  more difficulty breathing and coughing more. The cough is productive of minimal white mucus.  The pt hurts all the time in all the joints.  The joints are swelling more.  The pt did benefit from embrel but could not afford.  The pt is more fatigued.  When the pt  gets up the pt  is tired.  No real chest pain.  Mucus is minimal.  Arthritis is worse.  Hurts all the time.  Joints swell. Was on embrel but could not afford.   May 01, 2010 10:21 AM Developed a sinus infection,  then developed a stroke:  confusion, memory issues, disequilibrium. Was not hospitalized.  Two weeks went by and saw PCP and he Dx this.  Had an MRI of the head. Pt is is still on an Abx for the sinuses:  cough, nose is draining and runs, voice is lost.  Cough is productive whitish yellow.   Is now off ASA and plavix and is on new med for CVA.  Pt to call back with the names. Pt is on Dulera and this helps the breathing better than the symbicort did.   Preventive Screening-Counseling & Management  Alcohol-Tobacco     Smoking Status: never  Current Medications (verified): 1)  Metoprolol Succinate 50 Mg  Tb24 (Metoprolol  Succinate) .... Once Daily 2)  Leflunomide 20 Mg  Tabs (Leflunomide) .... Once Daily 3)  Crestor 20 Mg  Tabs (Rosuvastatin Calcium) .... Once Daily 4)  Centrum Silver   Chew (Multiple Vitamins-Minerals) .... Once Daily 5)  Fish Oil   Oil (Fish Oil) .... Once Daily 6)  Dulera 200-5 Mcg/act Aero (Mometasone Furo-Formoterol Fum) .... 2 Puffs Twice Per Day 7)  Glucophage 500 Mg Tabs (Metformin Hcl) .Marland Kitchen.. 1-2 Tablets Two Times A Day  Allergies (verified): No Known Drug Allergies  Past History:  Past medical, surgical, family and social histories (including risk factors) reviewed, and no changes noted (except as noted below).  Past Medical History: Coronary Heart Disease Hypertension Hyperlipidemia Rheumatoid Arthritis on Arava.  Dx for 70yrs.  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 Breast reduction gallbladder appendectomy C A B G:1998 cardiac stents hernia repair umbilical  Family History: Reviewed history from 03/14/2008 and no changes required. heart disease--mother father 5 sisters 5 brothers  Social History: Reviewed history from 03/14/2008 and no changes required. widowed children retired from [employer] Never smoked  Review of  Systems       The patient complains of shortness of breath with activity, productive cough, difficulty swallowing, nasal congestion/difficulty breathing through nose, and change in color of mucus.  The patient denies shortness of breath at rest, non-productive cough, coughing up blood, chest pain, irregular heartbeats, acid heartburn, indigestion, loss of appetite, weight change, abdominal pain, sore throat, tooth/dental problems, headaches, sneezing, itching, ear ache, anxiety, depression, hand/feet swelling, joint stiffness or pain, rash, and fever.    Vital Signs:  Patient profile:    77 year old female Height:      60 inches Weight:      130 pounds BMI:     25.48 O2 Sat:      96 % on Room air Temp:     97.5 degrees F oral Pulse rate:   70 / minute BP sitting:   120 / 80  (left arm) Cuff size:   regular  Vitals Entered By: Gweneth Dimitri RN (May 01, 2010 10:10 AM)  O2 Flow:  Room air CC: 6 wk follow up.  Pt is currently being treated for sinus infection.  States breathing is not the best but relates this to the sinus infection-having difficulty breathing mainly at rest.  States she is also cough "a whole lot."  Cough is prod with light yellow mucus.  Comments Medications reviewed with patient Daytime contact number verified with patient. Crystal Jones RN  May 01, 2010 10:14 AM    Physical Exam  Additional Exam:  Gen: WD WN     WF     in NAD    NCAT Heent:  no jvd, no TMG, no cervical LNademopathy, orophyx clear,  nares with clear watery drainage. Cor: RRR nl s1/s2  no s3/s4  no m r h g Abd: soft NT BSA   no masses  No HSM  no rebound or guarding Ext perfused with no c v e v.d Neuro: intact, moves all 4s, CN II-XII intact, DTRs intact Chest:   no wheezes, no  rhonchi   no egophony  no consolidative breath sounds,  no change dry rales Skin: clear  Genital/Rectal :deferred    Impression & Recommendations:  Problem # 1:  OTHER ACUTE SINUSITIS (ICD-461.8) Assessment Improved improved sinusitis plan finish current abx program  trial veramyst two spray each nostril x two samples then d/c Her updated medication list for this problem includes:    Veramyst 27.5 Mcg/spray Susp (Fluticasone furoate) .Marland Kitchen..Marland Kitchen Two puffs each nostril daily    Augmentin 875-125 Mg Tabs (Amoxicillin-pot clavulanate) .Marland Kitchen... Take 1 tab by mouth every 12 hours with food  Orders: Est. Patient Level IV (34742)  Problem # 2:  C V A / STROKE (ICD-436) Assessment: Improved recent parietal cva per mri plan  per pcp   Problem # 3:  PULMONARY FIBROSIS, POSTINFLAMMATORY  (ICD-515) Assessment: Unchanged stable pulm fibrosis on basis of RA plan stay off pred cont leflunimide cont dulera for bronchodilation  Medications Added to Medication List This Visit: 1)  Glucophage 500 Mg Tabs (Metformin hcl) .Marland Kitchen.. 1-2 tablets two times a day 2)  Veramyst 27.5 Mcg/spray Susp (Fluticasone furoate) .... Two puffs each nostril daily  Complete Medication List: 1)  Metoprolol Succinate 50 Mg Tb24 (Metoprolol succinate) .... Once daily 2)  Leflunomide 20 Mg Tabs (Leflunomide) .... Once daily 3)  Crestor 20 Mg Tabs (Rosuvastatin calcium) .... Once daily 4)  Centrum Silver Chew (Multiple vitamins-minerals) .... Once daily 5)  Fish Oil Oil (Fish oil) .... Once daily  6)  Dulera 200-5 Mcg/act Aero (Mometasone furo-formoterol fum) .... 2 puffs twice per day 7)  Glucophage 500 Mg Tabs (Metformin hcl) .Marland Kitchen.. 1-2 tablets two times a day 8)  Veramyst 27.5 Mcg/spray Susp (Fluticasone furoate) .... Two puffs each nostril daily 9)  Augmentin 875-125 Mg Tabs (Amoxicillin-pot clavulanate) .... Take 1 tab by mouth every 12 hours with food 10)  Aggrenox 25-200 Mg Xr12h-cap (Aspirin-dipyridamole) .... Take 1 tablet by mouth two times a day 11)  Synthroid 50 Mcg Tabs (Levothyroxine sodium) .... Take 1 tab by mouth once a day each morning on an empty stomach.  Patient Instructions: 1)  Finish current antibiotic (please call us with the name and dose) 2)  Use Veramyst two sprays each nostril daily until samples are gone 3)  Stay on Dulera two puff twice daily 4)  Please call us with your new stroke medications 5)  Return two months [place]  Appended Document: Pulmonary OV fax drs zimenski and ross

## 2010-10-30 NOTE — Miscellaneous (Signed)
Summary: Orders Update  Clinical Lists Changes  Orders: Added new Service order of Est. Patient Level III (99213) - Signed 

## 2010-10-30 NOTE — Medication Information (Signed)
Summary: Rx review [facility]  Rx review [facility]   Imported By: Lester [place] 09/08/2010 09:12:07  _____________________________________________________________________  External Attachment:    Type:   Image     Comment:   External Document

## 2010-11-05 NOTE — Assessment & Plan Note (Signed)
Summary: [facility]   Visit Type:  Initial Consult Referring Provider:  Zimenski: Rheumatology Primary Provider:  Thomasene Mohair  CC:  Cardiology consult.  History of Present Illness: 77 year old female for evaluation of coronary artery disease. Status post coronary artery bypass graft and valve repair in 1998. Patient had a LIMA to the LAD, saphenous vein graft to PDA, saphenous vein graft to the intermediate and saphenous vein graft to the marginal. Last cardiac catheterization was performed in 2005 and revealed severe three-vessel obstructive coronary artery disease. Patent saphenous vein graft to the posterior descending artery but with high-grade stenosis at the anastomosis site. Patent left internal mammary artery graft to the left anterior descending. Two occluded vein grafts presumably to intermediate and marginal vessels. Mild left ventricular dysfunction. Successful stenting of the saphenous vein graft to the posterior descending artery at the distal anastomosis. She has been followed by Dr. Swaziland. However she would like to be followed in [facility]. She does have dyspnea on exertion which is unchanged and felt secondary to pulmonary fibrosis. There is no orthopnea, PND, pedal edema, palpitations or syncope. There is no chest pain.  Current Medications (verified): 1)  Metoprolol Succinate 50 Mg  Tb24 (Metoprolol Succinate) .... Take 1 Tablet By Mouth Two Times A Day 2)  Leflunomide 20 Mg  Tabs (Leflunomide) .... Once Daily 3)  Crestor 20 Mg  Tabs (Rosuvastatin Calcium) .... Once Daily 4)  Centrum Silver   Chew (Multiple Vitamins-Minerals) .... Once Daily 5)  Fish Oil 1200 Mg Caps (Omega-3 Fatty Acids) .... Take 1 Capsule By Mouth Once A Day 6)  Dulera 200-5 Mcg/act Aero (Mometasone Furo-Formoterol Fum) .... 2 Puffs Twice Per Day 7)  Aggrenox 25-200 Mg Xr12h-Cap (Aspirin-Dipyridamole) .... Take 1 Tablet By Mouth Two Times A Day 8)  Synthroid 50 Mcg Tabs (Levothyroxine Sodium) ....  Take 1 Tab By Mouth Once A Day Each Morning On An Empty Stomach. 9)  Tylenol Arthritis Pain 650 Mg Cr-Tabs (Acetaminophen) .... As Needed 10)  Imodium A-D 2 Mg Tabs (Loperamide Hcl) .... As Needed  Allergies: 1)  Augmentin  Past History:  Past Medical History: Coronary Heart Disease C V A / Stroke    -MRI L parietal lobe infarction 04/25/10 HYPERLIPIDEMIA  HYPERTENSION DIABETES MELLITUS, TYPE II  Hypothyroid GERD  PULMONARY FIBROSIS, POSTINFLAMMATORY  Rheumatoid Arthritis on Arava.  Dx for 22yrs.  pulmonary fibrosis due to RA    -TLC 79%  DLCO 93% 2009  Past Surgical History: Breast reduction Cholecystectomy. appendectomy C A B G:1998; valve repair cardiac stents hernia repair umbilical tonsillectomy hysterectomy Carpel tunnel  Family History: Reviewed history from 03/14/2008 and no changes required. heart disease--mother father 5 sisters 5 brothers  Social History: Reviewed history from 05/01/2010 and no changes required. widowed retired from [employer] Never smoked Rare ETOH  Review of Systems       Problems with arthralgias and occasional cough but no fevers or chills,  hemoptysis, dysphasia, odynophagia, melena, hematochezia, dysuria, hematuria, rash, seizure activity, orthopnea, PND, pedal edema, claudication. Remaining systems are negative.   Vital Signs:  Patient profile:   77 year old female Height:      60 inches Weight:      121.25 pounds BMI:     23.77 Pulse rate:   56 / minute Pulse rhythm:   regular Resp:     18 per minute BP sitting:   140 / 90  (right arm) Cuff size:   large  Vitals Entered By: Vikki Ports (October 29, 2010 10:06 AM)  Physical Exam  General:  Well developed/well nourished in NAD Skin warm/dry Patient not depressed No peripheral clubbing Back-normal HEENT-normal/normal eyelids Neck supple/normal carotid upstroke bilaterally; no bruits; no JVD; no thyromegaly chest - diffuse dry crackles CV - RRR/normal S1  and S2; no  rubs or gallops;  PMI nondisplaced; 2/6 systolic ejection murmur. Abdomen -NT/ND, no HSM, no mass, + bowel sounds, no bruit 2+ femoral pulses, no bruits Ext-no edema, chords, 2+ DP Neuro-grossly nonfocal     Impression & Recommendations:  Problem # 1:  HYPERLIPIDEMIA (ICD-272.4) Continue statin. Check lipids and liver. Her updated medication list for this problem includes:    Crestor 20 Mg Tabs (Rosuvastatin calcium) ..... Once daily  Orders: T-Hepatic Function [phone]) T-Lipid Profile [phone])  Problem # 2:  HYPERTENSION (ICD-401.9) Blood pressure mildly elevated. She will follow this and we will increase medications or add additional medications as needed. Her updated medication list for this problem includes:    Metoprolol Succinate 50 Mg Tb24 (Metoprolol succinate) .Marland Kitchen... Take 1 tablet by mouth two times a day  Problem # 3:  CORONARY HEART DISEASE (ICD-414.00) Continue aspirin, beta blocker and statin. We will obtain records from Dr. Elvis Coil office. Her updated medication list for this problem includes:    Metoprolol Succinate 50 Mg Tb24 (Metoprolol succinate) .Marland Kitchen... Take 1 tablet by mouth two times a day    Aggrenox 25-200 Mg Xr12h-cap (Aspirin-dipyridamole) .Marland Kitchen... Take 1 tablet by mouth two times a day  Problem # 4:  ARTHRITIS (ICD-716.90) Management per rheumatology.  Problem # 5:  DIABETES MELLITUS, TYPE II (ICD-250.00) Management per primary care.  Problem # 6:  PULMONARY FIBROSIS, POSTINFLAMMATORY (ICD-515)  Patient Instructions: 1)  Your physician wants you to follow-up in:6 MONTHS   You will receive a reminder letter in the mail two months in advance. If you don't receive a letter, please call our office to schedule the follow-up appointment.

## 2010-11-05 NOTE — Progress Notes (Signed)
  ROI faxed to [facility]  October 30, 2010 10:54 AM     Appended Document:  Records received from Dr.Peter Sw[place] gave to [place]

## 2010-11-05 NOTE — Letter (Signed)
Summary: [facility]  [facility], Main Office  [address]   [place], [place] [address]   Phone: [phone]  Fax: [phone]     October 30, 2010 MRN: [id number]   Briana Ray [address] [place], [place]  [address]   Dear [name],  We have reviewed your cholesterol results.  They are as follows:     Total Cholesterol:    144 (Desirable: less than 200)       HDL  Cholesterol:     45  (Desirable: greater than 40 for men and 50 for women)       LDL Cholesterol:       66  (Desirable: less than 100 for low risk and less than 70 for moderate to high risk)       Triglycerides:       166  (Desirable: less than 150)  Our recommendations include:These numbers look good. Continue on the same medicine. Liver function is normal. Take care, Dr. Darel Hong.    Call our office at the number listed above if you have any questions.  Lowering your LDL cholesterol is important, but it is only one of a large number of "risk factors" that may indicate that you are at risk for heart disease, stroke or other complications of hardening of the arteries.  Other risk factors include:   A.  Cigarette Smoking* B.  High Blood Pressure* C.  Obesity* D.   Low HDL Cholesterol (see yours above)* E.   Diabetes Mellitus (higher risk if your is uncontrolled) F.  Family history of premature heart disease G.  Previous history of stroke or cardiovascular disease    *These are risk factors YOU HAVE CONTROL OVER.  For more information, visit .  There is now evidence that lowering the TOTAL CHOLESTEROL AND LDL CHOLESTEROL can reduce the risk of heart disease.  The American Heart Association recommends the following guidelines for the treatment of elevated cholesterol:  1.  If there is now current heart disease and less than two risk factors, TOTAL CHOLESTEROL should be less than 200 and LDL CHOLESTEROL should be less than 100. 2.  If there is current heart disease  or two or more risk factors, TOTAL CHOLESTEROL should be less than 200 and LDL CHOLESTEROL should be less than 70.  A diet low in cholesterol, saturated fat, and calories is the cornerstone of treatment for elevated cholesterol.  Cessation of smoking and exercise are also important in the management of elevated cholesterol and preventing vascular disease.  Studies have shown that 30 to 60 minutes of physical activity most days can help lower blood pressure, lower cholesterol, and keep your weight at a healthy level.  Drug therapy is used when cholesterol levels do not respond to therapeutic lifestyle changes (smoking cessation, diet, and exercise) and remains unacceptably high.  If medication is started, it is important to have you levels checked periodically to evaluate the need for further treatment options.  Thank you,    [employer] Team

## 2010-11-19 NOTE — Progress Notes (Signed)
Summary: [facility]: Office Visit  [facility]: Office Visit   Imported By: Earl Many 11/12/2010 15:46:30  _____________________________________________________________________  External Attachment:    Type:   Image     Comment:   External Document

## 2010-12-07 ENCOUNTER — Emergency Department (HOSPITAL_BASED_OUTPATIENT_CLINIC_OR_DEPARTMENT_OTHER)
Admission: EM | Admit: 2010-12-07 | Discharge: 2010-12-07 | Disposition: A | Discharge status: Home / Self Care | Payer: Medicare Other | Attending: Emergency Medicine | Admitting: Emergency Medicine

## 2010-12-07 ENCOUNTER — Emergency Department (HOSPITAL_BASED_OUTPATIENT_CLINIC_OR_DEPARTMENT_OTHER): Payer: Medicare Other

## 2010-12-07 DIAGNOSIS — E7800 Pure hypercholesterolemia, unspecified: Secondary | ICD-10-CM | POA: Insufficient documentation

## 2010-12-07 DIAGNOSIS — M810 Age-related osteoporosis without current pathological fracture: Secondary | ICD-10-CM | POA: Insufficient documentation

## 2010-12-07 DIAGNOSIS — R109 Unspecified abdominal pain: Secondary | ICD-10-CM

## 2010-12-07 DIAGNOSIS — Z8739 Personal history of other diseases of the musculoskeletal system and connective tissue: Secondary | ICD-10-CM | POA: Insufficient documentation

## 2010-12-07 DIAGNOSIS — K5730 Diverticulosis of large intestine without perforation or abscess without bleeding: Secondary | ICD-10-CM

## 2010-12-07 DIAGNOSIS — Z951 Presence of aortocoronary bypass graft: Secondary | ICD-10-CM | POA: Insufficient documentation

## 2010-12-07 DIAGNOSIS — R6883 Chills (without fever): Secondary | ICD-10-CM

## 2010-12-07 DIAGNOSIS — R1032 Left lower quadrant pain: Secondary | ICD-10-CM | POA: Insufficient documentation

## 2010-12-07 DIAGNOSIS — I714 Abdominal aortic aneurysm, without rupture, unspecified: Secondary | ICD-10-CM | POA: Insufficient documentation

## 2010-12-07 DIAGNOSIS — I10 Essential (primary) hypertension: Secondary | ICD-10-CM | POA: Insufficient documentation

## 2010-12-07 DIAGNOSIS — Z79899 Other long term (current) drug therapy: Secondary | ICD-10-CM | POA: Insufficient documentation

## 2010-12-07 DIAGNOSIS — K5289 Other specified noninfective gastroenteritis and colitis: Secondary | ICD-10-CM | POA: Insufficient documentation

## 2010-12-07 DIAGNOSIS — I252 Old myocardial infarction: Secondary | ICD-10-CM | POA: Insufficient documentation

## 2010-12-07 LAB — DIFFERENTIAL
Basophils Absolute: 0 10*3/uL (ref 0.0–0.1)
Eosinophils Relative: 2 % (ref 0–5)
Lymphs Abs: 1.5 10*3/uL (ref 0.7–4.0)
Monocytes Relative: 11 % (ref 3–12)
Neutro Abs: 6.7 10*3/uL (ref 1.7–7.7)
Neutrophils Relative %: 71 % (ref 43–77)

## 2010-12-07 LAB — COMPREHENSIVE METABOLIC PANEL
AST: 85 U/L — ABNORMAL HIGH (ref 0–37)
CO2: 25 mEq/L (ref 19–32)
Calcium: 9.6 mg/dL (ref 8.4–10.5)
Chloride: 106 mEq/L (ref 96–112)
Creatinine, Ser: 0.8 mg/dL (ref 0.4–1.2)
GFR calc Af Amer: >60 mL/min (ref >60)
Total Protein: 8.1 g/dL (ref 6.0–8.3)

## 2010-12-07 LAB — CBC
HCT: 36.7 % (ref 36.0–46.0)
Hemoglobin: 12.3 g/dL (ref 12.0–15.0)
RBC: 4.01 MIL/uL (ref 3.87–5.11)
RDW: 13.4 % (ref 11.5–15.5)

## 2010-12-07 LAB — URINALYSIS, ROUTINE W REFLEX MICROSCOPIC
Bilirubin Urine: NEGATIVE
Glucose, UA: NEGATIVE mg/dL
Hgb urine dipstick: NEGATIVE

## 2010-12-07 MED ORDER — IOHEXOL 300 MG/ML  SOLN
100.0000 mL | Freq: Once | INTRAMUSCULAR | Status: AC | PRN
  Administered 2010-12-07: 100 mL via INTRAVENOUS

## 2010-12-08 ENCOUNTER — Emergency Department (HOSPITAL_BASED_OUTPATIENT_CLINIC_OR_DEPARTMENT_OTHER)
Admission: EM | Admit: 2010-12-08 | Discharge: 2010-12-08 | Disposition: A | Discharge status: Home / Self Care | Payer: Medicare Other | Attending: Emergency Medicine | Admitting: Emergency Medicine

## 2010-12-08 DIAGNOSIS — Z8739 Personal history of other diseases of the musculoskeletal system and connective tissue: Secondary | ICD-10-CM | POA: Insufficient documentation

## 2010-12-08 DIAGNOSIS — I252 Old myocardial infarction: Secondary | ICD-10-CM | POA: Insufficient documentation

## 2010-12-08 DIAGNOSIS — Z79899 Other long term (current) drug therapy: Secondary | ICD-10-CM | POA: Insufficient documentation

## 2010-12-08 DIAGNOSIS — E7800 Pure hypercholesterolemia, unspecified: Secondary | ICD-10-CM | POA: Insufficient documentation

## 2010-12-08 DIAGNOSIS — M810 Age-related osteoporosis without current pathological fracture: Secondary | ICD-10-CM | POA: Insufficient documentation

## 2010-12-08 DIAGNOSIS — Z8679 Personal history of other diseases of the circulatory system: Secondary | ICD-10-CM | POA: Insufficient documentation

## 2010-12-08 DIAGNOSIS — R112 Nausea with vomiting, unspecified: Secondary | ICD-10-CM | POA: Insufficient documentation

## 2010-12-08 DIAGNOSIS — I10 Essential (primary) hypertension: Secondary | ICD-10-CM | POA: Insufficient documentation

## 2010-12-08 DIAGNOSIS — Z951 Presence of aortocoronary bypass graft: Secondary | ICD-10-CM | POA: Insufficient documentation

## 2010-12-08 DIAGNOSIS — K5289 Other specified noninfective gastroenteritis and colitis: Secondary | ICD-10-CM | POA: Insufficient documentation

## 2010-12-08 LAB — BASIC METABOLIC PANEL
BUN: 17 mg/dL (ref 6–23)
Calcium: 9.8 mg/dL (ref 8.4–10.5)
Creatinine, Ser: 0.7 mg/dL (ref 0.4–1.2)
GFR calc Af Amer: >60 mL/min (ref >60)
GFR calc non Af Amer: >60 mL/min (ref >60)
Glucose, Bld: 159 mg/dL — ABNORMAL HIGH (ref 70–99)
Sodium: 141 mEq/L (ref 135–145)

## 2010-12-08 LAB — DIFFERENTIAL
Basophils Absolute: 0 10*3/uL (ref 0.0–0.1)
Basophils Relative: 0 % (ref 0–1)
Eosinophils Absolute: 0.1 10*3/uL (ref 0.0–0.7)
Monocytes Absolute: 0.6 10*3/uL (ref 0.1–1.0)
Monocytes Relative: 8 % (ref 3–12)
Neutro Abs: 6.1 10*3/uL (ref 1.7–7.7)
Neutrophils Relative %: 81 % — ABNORMAL HIGH (ref 43–77)

## 2010-12-08 LAB — CBC
HCT: 34.3 % — ABNORMAL LOW (ref 36.0–46.0)
MCHC: 33.2 g/dL (ref 30.0–36.0)
MCV: 90.7 fL (ref 78.0–100.0)
RDW: 13.2 % (ref 11.5–15.5)

## 2010-12-17 LAB — DIFFERENTIAL
Basophils Relative: 2 % — ABNORMAL HIGH (ref 0–1)
Lymphocytes Relative: 29 % (ref 12–46)
Lymphs Abs: 1.9 10*3/uL (ref 0.7–4.0)
Monocytes Relative: 7 % (ref 3–12)
Neutro Abs: 3.8 10*3/uL (ref 1.7–7.7)
Neutrophils Relative %: 60 % (ref 43–77)

## 2010-12-17 LAB — CBC
Hemoglobin: 13.6 g/dL (ref 12.0–15.0)
RBC: 4.4 MIL/uL (ref 3.87–5.11)
WBC: 6.4 10*3/uL (ref 4.0–10.5)

## 2010-12-17 LAB — POCT CARDIAC MARKERS
CKMB, poc: 1.9 ng/mL (ref 1.0–8.0)
Myoglobin, poc: 123 ng/mL (ref 12–200)
Myoglobin, poc: 98.9 ng/mL (ref 12–200)
Troponin i, poc: <0.05 ng/mL (ref 0.00–0.09)

## 2010-12-17 LAB — BASIC METABOLIC PANEL
Calcium: 9.5 mg/dL (ref 8.4–10.5)
Creatinine, Ser: 0.7 mg/dL (ref 0.4–1.2)
GFR calc Af Amer: >60 mL/min (ref >60)
GFR calc non Af Amer: >60 mL/min (ref >60)
Sodium: 143 mEq/L (ref 135–145)

## 2010-12-30 ENCOUNTER — Other Ambulatory Visit: Payer: Self-pay | Admitting: *Deleted

## 2010-12-30 DIAGNOSIS — E785 Hyperlipidemia, unspecified: Secondary | ICD-10-CM

## 2010-12-30 MED ORDER — ROSUVASTATIN CALCIUM 20 MG PO TABS: 20.0000 mg | ORAL_TABLET | Freq: Every day | ORAL | Status: DC

## 2010-12-30 NOTE — Telephone Encounter (Signed)
escribe medication per fax request  

## 2011-01-01 LAB — BASIC METABOLIC PANEL
BUN: 17 mg/dL (ref 6–23)
CO2: 26 mEq/L (ref 19–32)
Calcium: 9.6 mg/dL (ref 8.4–10.5)
Glucose, Bld: 170 mg/dL — ABNORMAL HIGH (ref 70–99)
Sodium: 142 mEq/L (ref 135–145)

## 2011-01-01 LAB — CBC
Hemoglobin: 15.8 g/dL — ABNORMAL HIGH (ref 12.0–15.0)
MCHC: 32.9 g/dL (ref 30.0–36.0)
RDW: 14.7 % (ref 11.5–15.5)

## 2011-01-01 LAB — DIFFERENTIAL
Basophils Absolute: 0.3 10*3/uL — ABNORMAL HIGH (ref 0.0–0.1)
Basophils Relative: 3 % — ABNORMAL HIGH (ref 0–1)
Eosinophils Relative: 2 % (ref 0–5)
Monocytes Absolute: 0.9 10*3/uL (ref 0.1–1.0)
Neutro Abs: 6.6 10*3/uL (ref 1.7–7.7)

## 2011-01-26 ENCOUNTER — Other Ambulatory Visit: Payer: Self-pay | Admitting: *Deleted

## 2011-01-26 MED ORDER — METOPROLOL TARTRATE 25 MG PO TABS: 25.0000 mg | ORAL_TABLET | Freq: Two times a day (BID) | ORAL | Status: DC

## 2011-01-26 NOTE — Telephone Encounter (Signed)
escribe medication per fax request  

## 2011-02-10 NOTE — Op Note (Signed)
NAMEJAMARIS, BIERNAT NO.:  [id number]   MEDICAL RECORD NO.:  [id number]          PATIENT TYPE:  AMB   LOCATION:  CARD                         FACILITY:  [facility]   PHYSICIAN:  Charlcie Cradle. Delford Field, MD, FCCPDATE OF BIRTH:  [birth year] 18, 1923   DATE OF PROCEDURE:  03/21/2008  DATE OF DISCHARGE:                               OPERATIVE REPORT   PROCEDURE:  Bronchoscopy.   SURGEON:  Charlcie Cradle. Delford Field, MD, FCCP.   INDICATIONS FOR PROCEDURE:  Bilateral pulmonary interstitial  infiltrates, evaluate for cause.   PREMEDICATION:  Versed 4 mg, fentanyl 40 mcg IV.   ANESTHESIA:  1% Xylocaine local.   PROCEDURE:  The Pentax video bronchoscope was introduced via the right  naris.  The upper airways were visualized and unremarkable.  The entire  tracheobronchial tree was visualized and revealed no endobronchial  lesion.  Attention was then paid to the right lower lobe.  Lateral  segment transbronchial biopsies x6 were obtained.  There was a 75 mL  hemorrhage at the end of the procedure.  This was controlled with  topical epinephrine and thrombin.  At the end of the procedure, the  bleeding did seem to subside.  No other complications identified.   IMPRESSION:  Bilateral pulmonary fibrosis with underlying rheumatoid  arthritis, evaluate for cause.   RECOMMENDATIONS:  Follow up pathology.      Charlcie Cradle Delford Field, MD, [facility]  Electronically Signed     PEW/MEDQ  D:  03/21/2008  T:  03/21/2008  Job:  [id number]

## 2011-02-13 NOTE — Discharge Summary (Signed)
NAME:  Briana Ray, Briana Ray                     ACCOUNT NO.:  [id number]   MEDICAL RECORD NO.:  [id number]                   PATIENT TYPE:  INP   LOCATION:  3735                                 FACILITY:  [facility]   PHYSICIAN:  Peter M. Swaziland, M.D.               DATE OF BIRTH:  01-25-1924   DATE OF ADMISSION:  05/03/2004  DATE OF DISCHARGE:  05/07/2004                                 DISCHARGE SUMMARY   HISTORY OF PRESENT ILLNESS:  Ms. Mullan is a 77 year old, white female with  history of coronary artery disease, status post coronary artery bypass graft  surgery in 1998, who presents with an acute inferior myocardial infarction.  The patient has a history of hypertension and hyperlipidemia.  For details  of her past medical history, social history, family history and physical  exam, please see admission History and Physical.   LABORATORY DATA AND X-RAY FINDINGS:  Her chest x-ray showed postoperative  changes from her bypass surgery without active disease.  Her ECG showed  normal sinus rhythm with acute ST elevation.  The inferior leads consistent  with inferior injury.   Her pH was 7.30, pCO2 52, bicarb 26.  White count 8200, hemoglobin 13.7,  hematocrit 40.5, platelets 296,000.  Coagulations were normal.  Sodium 139,  potassium 4.0, chloride 105, CO2 27, BUN 18, creatinine 0.8, glucose 113.  Other chemistries were normal.  Alk phos was mildly elevated at 118.  Other  liver functions were normal.  CK was 134 with 7.4 MB, troponin 0.64.  Cholesterol 135, triglycerides 268, HDL 34, LDL 47.  TSH was slightly  elevated at 5.584.   HOSPITAL COURSE:  The patient presented with over 12 hours of chest pain,  but in somewhat of a stuttering course.  She was treated in the emergency  room with IV heparin, IV Integrillin, IV nitroglycerin, IV Lopressor and  aspirin.  Her pain resolved.  She was taken emergently to the cardiac  catheterization lab.  Cardiac catheterization demonstrated severe  diffuse  disease in the proximal to the mid LAD which was supplied by the graft.  The  first obtuse marginal vessel was patent, but the ongoing circumflex was  occluded.  There was an intermediate vessel that was occluded.  The right  coronary artery was a large dominant vessel that had diffuse atherosclerosis  with approximately 50-60% stenosis in the mid vessel.  It did have adequate  flow down both the posterolateral branches.  The PDA, however, was occluded.  The saphenous vein graft to intermediate was occluded, saphenous vein graft  to the second obtuse marginal vessel was occluded.  The LIMA graft to the  LAD was patent.  The saphenous vein graft to the PDA was patent, but had a  high-grade 95% stenosis at the anastomotic site.  This was felt to be her  culprit lesion.  Left ventricular function was mildly reduced with severe  distal inferior wall hypokinesia.  The  patient underwent emergent stenting  of the anastomosis of the saphenous vein graft to the PDA using a 2.5 x 16  mm Taxus drug-alluding stent.  An excellent angiographic result was  obtained.  Subsequent ECG showed some evolutionary changes of inferior  infarction with resolution of her ST segment elevation.  Her troponin peaked  at 1.03 and then subsequently declined.  Her CPK peaked at her admission  level and then subsequently declined.  The patient was continued on IV  Integrillin x 18 hours.  She was treated with aspirin, Plavix and  metoprolol.  Her IV nitroglycerin was discontinued the following morning.  She did very well with only one minor episode of chest pain during the  remainder of her hospital stay that was relieved with nitroglycerin.  She  did have relatively low blood pressure which resulted in discontinuing her  ACE inhibitor.  She subsequently was ambulated with cardiac rehabilitation  and did well.  She was discharged home on May 07, 2004.   DISCHARGE DIAGNOSES:  1. Acute inferior myocardial  infarction.  2. Atherosclerotic coronary artery disease, status coronary artery bypass     graft in 1998, now documented two vein graft occlusions supplying an     intermediate and second obtuse marginal vessels which are also occluded.     The saphenous vein graft to the posterior descending artery was patent,     but had a high-grade stenosis at anastomotic site and this was     successfully stented.  3. Hypertension.  4. Hyperlipidemia.  5. History of arthritis.   DISCHARGE MEDICATIONS:  1. Toprol XL 50 mg per day.  2. Crestor 10 mg daily.  3. Aspirin 81 mg per day.  4. Plavix 75 mg daily.  5. Arava 20 mg per day.  6. Nitroglycerin sublingual p.r.n.   ACTIVITY:  The patient is instructed in progressive walking.  Recommended  followup with outpatient cardiac rehabilitation.   DIET:  Low fat, low cholesterol diet.   FOLLOW UP:  Follow up with Dr. Swaziland in 2 weeks.                                                Peter M. Swaziland, M.D.    PMJ/MEDQ  D:  05/07/2004  T:  05/07/2004  Job:  [id number]   cc:   C. Duane Lope, M.D.  [address]  [place]  [place] [address]  Fax: [phone]   Lemmie Evens, M.D.  [address]  [place]  [place] [address]  Fax: [phone]

## 2011-02-13 NOTE — Cardiovascular Report (Signed)
NAME:  Briana Ray, Briana Ray                     ACCOUNT NO.:  [id number]   MEDICAL RECORD NO.:  [id number]                   PATIENT TYPE:  INP   LOCATION:  2923                                 FACILITY:  [facility]   PHYSICIAN:  Peter M. Swaziland, M.D.               DATE OF BIRTH:  08-18-1924   DATE OF PROCEDURE:  05/03/2004  DATE OF DISCHARGE:                              CARDIAC CATHETERIZATION   INDICATION FOR PROCEDURE:  [age]-year-old white female with history  of coronary artery disease, status post CABG in 1998, presents with an acute  inferior myocardial infarction.   PROCEDURE:  1. Left heart catheterization.  2. Coronary and left ventricular angiography.  3. Saphenous vein graft angiography x3.  4. Left internal mammary artery graft angiography.  5. Intracoronary stenting of the distal anastomosis of the vein graft to the     posterior descending artery.   ACCESS:  Access was via the right femoral artery using the standard  Seldinger technique.   EQUIPMENT:  The 6-French 4-cm right and left Judkins catheters, a 6-French  pigtail catheter, 6-French multipurpose A2 catheter, 6-French LIMA catheter,  6-French arterial sheath, 6-French multipurpose 1 guide with sideholes and a  2.5 x 15.0-mm Maverick balloon and 2.5 x 16.0-mm TAXUS drug-eluting stent.   MEDICATIONS:  The patient was given IV heparin 3000 units IV with subsequent  ACT of 233.  She was given double-bolus Integrilin at 180 mcg/kg, followed  by continuous infusion of 1 mcg/kg per minute.  She was on IV nitroglycerin  drip at 10 mcg per minute.  She was given Versed 1 mg IV.  She was given  nitroglycerin 200 mg intracoronary x3 and Plavix 300 mg p.o.   CONTRAST:  275 mL of Omnipaque.   HEMODYNAMIC DATA:  Aortic pressure was 102/63 with a mean of 78 mmHg.  Left  ventricular pressure was 102 with an EDP of 10 mmHg.   ANGIOGRAPHIC DATA:  The left coronary arises and distributes normally.  The  left main  coronary artery has 30% narrowing distally.   The left anterior descending artery is diffusely and severely diseased in  the proximal-to-mid vessel with diffuse areas of 80% to 90% stenosis.  The  mid-to-distal LAD fills by IMA graft.   There is a tiny intermediate vessel which is occluded.  There are only very  faint collaterals to this vessel.   The circumflex vessel has 40% to 50% ostial narrowing.  The first obtuse  marginal vessel is a large branch and has no significant disease.  The left  circumflex coronary is occluded after the first marginal branch.   The right coronary artery is a large dominant vessel.  It has diffuse  atherosclerotic with diffuse scalloping of the vessel.  There is a 50% to  60% stenosis in the mid-vessel.  The PDA is totally occluded and it appears  diffusely diseased.  There are 2 posterolateral branches which are patent  without significant disease.   There are 2 saphenous vein grafts which are occluded at the ostium.  The  insertion of these grafts is unknown at the time of procedure, but  presumably supplied the intermediate vessel and the distal circumflex.  The  saphenous vein graft to the PDA is patent.  The vein graft itself is small  in caliber.  There is severe 95% to 99% stenosis at the anastomosis of the  vein graft with the native vessel.   The LIMA graft to the LAD is widely patent with excellent distal runoff.   LEFT VENTRICULAR ANGIOGRAPHY:  Left ventricular angiography performed in the  RAO view demonstrates mild anterior wall hypokinesis.  There is severe  hypokinesis of the distal inferior wall.  Overall, left ventricular systolic  function is mildly reduced with ejection fraction estimated at 45%.  There  is no significant mitral insufficiency.   PERCUTANEOUS INTERVENTION:  We proceeded at this point with percutaneous  intervention of the vein graft to the PDA, which appeared to be the culprit  lesion.  This was initially crossed  without difficulty with the guidewire.  It was predilated using the 2.5-mm Maverick balloon to 4, 6 and then 8  atmospheres.  This showed an improved angiographic result.  We then placed a  2.5 x 16.0-mm  TAXUS drug-eluting stent across the lesion and deployed the  stent at 9 atmospheres.  We then post-dilated to 12 atmospheres.  This  yielded an excellent angiographic result with 0% residual stenosis and TIMI-  3 flow, although it was noted that the PDA was a relatively small vessel.  The patient tolerated the procedure well without chest pain.  She was noted  to have increased ST elevation with each balloon inflation.   FINAL INTERPRETATION:  1. Severe three-vessel obstructive coronary artery disease.  2. Patent saphenous vein graft to the posterior descending artery but with     high-grade stenosis at the anastomosis site.  This is the culprit lesion.  3. Patent left internal mammary artery graft to the left anterior     descending.  4. Two occluded vein grafts presumably to intermediate and marginal vessels.  5. Mild left ventricular dysfunction.  6. Successful stenting of the saphenous vein graft to the posterior     descending artery at the distal anastomosis.                                               Peter M. Swaziland, M.D.    PMJ/MEDQ  D:  05/03/2004  T:  05/05/2004  Job:  [id number]   cc:   C. Duane Lope, M.D.  [address]  [place]  [place] [address]  Fax: [phone]   Lemmie Evens, M.D.  [address]  [place]  [place] [address]  Fax: [phone]

## 2011-02-13 NOTE — H&P (Signed)
NAME:  Briana Ray NO.:  [id number]   MEDICAL RECORD NO.:  [id number]                   PATIENT TYPE:  EMS   LOCATION:  MAJO                                 FACILITY:  [facility]   PHYSICIAN:  Peter M. Swaziland, M.D.               DATE OF BIRTH:  05/09/1924   DATE OF ADMISSION:  05/03/2004  DATE OF DISCHARGE:                                HISTORY & PHYSICAL   Briana Ray is a 77 year old white female with a history of myocardial  infarction.  She is status post sputtering infarct in 1998 and subsequently  underwent coronary artery bypass graft surgery x 4 and mitral valve repair  by Dr. Cornelius Moras.  She has done very well since that time.  Her last evaluation  with  Cardiolite study in February 2003 showed no ischemia and ejection  fraction of 76%.  Last night she was walking on her treadmill when she  developed mid substernal chest pain without radiation.  It did radiate  across her chest associated with nausea but no vomiting.  She felt weak.  She had no shortness of breath or diaphoresis.  She initially felt that the  pain was due to indigestion, but her pain did not resolve during the night,  and she slept very fitfully.  This morning her pain intensified, and she  presented to the emergency room.  Initially her pain seemed to improve, but  currently her pain has recurred and has worsened.   PAST MEDICAL HISTORY:  1. ASCAD status post CABG x 4 with mitral valve repair in 1998.  2. Hypertension.  3. Hyperlipidemia.  4. Arthritis.   PAST SURGICAL HISTORY:  1. Cholecystectomy.  2. Hysterectomy.  3. Hernia repair.  4. T&A.  5. Carpal tunnel surgery.   ALLERGIES:  Intolerance to TRICOR.   MEDICATIONS:  1. Aspirin 81 mg per day.  2. Prinivil 10 mg per day.  3. Multivitamins daily.  4. Arava 10 mg daily.  5. Celebrex 200 mg b.i.d.  6. Crestor 10 mg per day.   SOCIAL HISTORY:  The patient is widowed.  She has four children.  She denies  tobacco or  alcohol use.   FAMILY HISTORY:  Father died at age 38 with renal disease.  Mother died  secondary to diabetes.  One sister and one brother have had bypass surgery.   REVIEW OF SYSTEMS:  She denies any claudication, has had no TIA or stroke.  She has had some numbness in her left neck and face, apparently has  undergone MRI and CT of her head without significant findings.  Other Review  of Systems are negative.   PHYSICAL EXAMINATION:  GENERAL:  The patient is an elderly white female in  no apparent distress.  VITAL SIGNS:  Blood pressure 114/62, pulse 20 and regular.  She is afebrile.  Pulse is 73.  O2 saturations are normal.  HEENT:  Normocephalic and atraumatic.  Pupils equal,  round, and reactive.  Conjunctivae clear.  Oropharynx clear.  NECK:  Without JVD, adenopathy, thyromegaly, or bruits.  LUNGS:  Reveal bibasilar crackles.  CARDIAC:  Reveals regular rate and rhythm without murmurs, rubs, or gallops.  ABDOMEN:  Soft and nontender.  There is no hepatosplenomegaly, masses, or  bruits.  EXTREMITIES:  Femoral and pedal pulses are 2+ and symmetric.  She has no  edema.  NEUROLOGIC:  Intact.   LABORATORY AND X-RAY DATA:  ECG shows normal sinus rhythm with right  ventricular conduction delay.  There is ST elevation consistent with acute  inferior infarction.   IMPRESSION:  1. Acute inferior myocardial infarction with late presentation but ongoing     chest pain.  2. Status post coronary artery bypass grafting x 4 in 1998 with mitral valve     repair.  3. Hypertension.  4. Hyperlipidemia.  5. Arthritis.   PLAN:  1. Will treat emergently with 4 chewable aspirin.  2. She will be started on IV nitroglycerin, IV heparin, IV Lopressor.  3. Will take for emergent cardiac catheterization.  4. Will need to obtain her old operative note.                                                Peter M. Swaziland, M.D.    PMJ/MEDQ  D:  05/03/2004  T:  05/03/2004  Job:  [id number]   cc:   Lemmie Evens, M.D.  [address]  [place]  [place] [address]  Fax: [phone]   C. Duane Lope, M.D.  [address]  [place]  [place] [address]  Fax: [phone]

## 2011-05-04 ENCOUNTER — Encounter: Payer: Self-pay | Admitting: *Deleted

## 2011-05-04 ENCOUNTER — Other Ambulatory Visit: Payer: Self-pay | Admitting: *Deleted

## 2011-05-04 ENCOUNTER — Telehealth: Payer: Self-pay | Admitting: Critical Care Medicine

## 2011-05-04 ENCOUNTER — Ambulatory Visit (INDEPENDENT_AMBULATORY_CARE_PROVIDER_SITE_OTHER): Payer: Medicare Other | Admitting: Critical Care Medicine

## 2011-05-04 ENCOUNTER — Ambulatory Visit (INDEPENDENT_AMBULATORY_CARE_PROVIDER_SITE_OTHER)
Admission: RE | Admit: 2011-05-04 | Discharge: 2011-05-04 | Disposition: A | Discharge status: Home / Self Care | Payer: Medicare Other | Source: Ambulatory Visit | Attending: Critical Care Medicine | Admitting: Critical Care Medicine

## 2011-05-04 VITALS — BP 110/70 | HR 83 | Temp 98.5°F | Ht <= 58 in | Wt 126.0 lb

## 2011-05-04 DIAGNOSIS — J8410 Pulmonary fibrosis, unspecified: Secondary | ICD-10-CM

## 2011-05-04 MED ORDER — PREDNISONE 10 MG PO TABS: ORAL_TABLET | ORAL | Status: DC

## 2011-05-04 NOTE — Assessment & Plan Note (Signed)
Acute fibrosis flare d/t rheumatoid arthritis and associated recent acute diverticulitis flare Progressive acute on chronic respiratory failure Note moderate restriction on 05/04/11 spirometry Plan Start oxygen therapy Pulse prednisone Stay on symbicort Chest xray 05/04/11

## 2011-05-04 NOTE — Progress Notes (Signed)
Subjective:    Patient ID: Briana Ray, female    DOB: 04-27-1924, 77 y.o.   MRN: [id number]  HPI 77 y.o.   WF with interstital changes on CT chest in setting of RA. Has RA induced interstitial lung disease.  Past Hx pertinent for severe Rheumatoid arthritis Dx 16yrs ago. Rx with Leflunamide. Not on methotrexate.  March 13, 2010 9:34 AM  The pt feels she is worsening. There is more difficulty breathing and coughing more. The cough is productive of minimal white mucus. The pt hurts all the time in all the joints. The joints are swelling more. The pt did benefit from embrel but could not afford. The pt is more fatigued. When the pt gets up the pt is tired. No real chest pain. Mucus is minimal. Arthritis is worse. Hurts all the time. Joints swell.  Was on embrel but could not afford.  May 01, 2010 10:21 AM  Developed a sinus infection, then developed a stroke: confusion, memory issues, disequilibrium. Was not hospitalized. Two weeks went by and saw PCP and he Dx this. Had an MRI of the head.  Pt is is still on an Abx for the sinuses: cough, nose is draining and runs, voice is lost. Cough is productive whitish yellow.  Is now off ASA and plavix and is on new med for CVA. Pt to call back with the names. Pt is on Dulera and this helps the breathing better than the symbicort did.  July 03, 2010 10:44 AM  The dry cough is a major issue. The pts eyes have improved. no more sinus infxn. The pt still gets dizzy, falls easily. Pt has a cane but does not use the cane. Still on dulera and this has helped  October 02, 2010 12:14 PM  The dulera helps but the pt cannot afford. No new issues. No cough. No excess mucus. No wheeze.  symbicort does not work as well.  Pt denies any significant sore throat, nasal congestion or excess secretions, fever, chills, sweats, unintended weight loss, pleurtic or exertional chest pain, orthopnea PND, or leg swelling  Pt denies any increase in rescue therapy over  baseline, denies waking up needing it or having any early am or nocturnal exacerbations of coughing/wheezing/or dyspnea.   05/04/2011 Since 1/12 much worse.  On ABX and severe cough that wont go away. Cough is dry, occ prod, mucus is clear.  Notes more dyspnea esp with deep breath.  No edema in feet.  No real heartburn.     Past Medical History  Diagnosis Date  . Coronary heart disease   . CVA (cerebral infarction) 04/25/10    MRI L parietal lobe infarction   . Hyperlipidemia   . Hypertension   . Diabetes mellitus, type 2   . Hypothyroid   . GERD (gastroesophageal reflux disease)   . 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 Rheumatoid arthritis     on Arava. Dx for 20 yrs     Family History  Problem Relation Age of Onset  . Heart disease Mother   . Heart disease Brother     x 5  . Heart disease Sister     x 5  . Heart disease Father      History   Social History  . Marital Status: Widowed    Spouse Name: N/A    Number of Children: N/A  . Years of Education: N/A   Occupational History  .  Retired     [employer]   Social History Main Topics  . Smoking status: Never Smoker   . Smokeless tobacco: Never Used  . Alcohol Use: Yes     rare  . Drug Use: Not on file  . Sexually Active: Not on file   Other Topics Concern  . Not on file   Social History Narrative  . No narrative on file     Allergies  Allergen Reactions  . ZOX:[id number] Acid+Aspartame     REACTION: diarrhea     Outpatient Prescriptions Prior to Visit  Medication Sig Dispense Refill  . metoprolol tartrate (LOPRESSOR) 25 MG tablet Take 1 tablet (25 mg total) by mouth 2 (two) times daily.  60 tablet  5  . rosuvastatin (CRESTOR) 20 MG tablet Take 1 tablet (20 mg total) by mouth at bedtime.  30 tablet  5     Review of Systems Constitutional:   No  weight loss, night sweats,  Fevers, chills, fatigue, lassitude. HEENT:   No headaches,   Difficulty swallowing,  Tooth/dental problems,  Sore throat,                No sneezing, itching, ear ache, nasal congestion, post nasal drip,   CV:  No chest pain,  Orthopnea, PND, swelling in lower extremities, anasarca, dizziness, palpitations  GI  No heartburn, indigestion, abdominal pain, nausea, vomiting, diarrhea, change in bowel habits, loss of appetite  Resp: Notes  shortness of breath with exertion not  at rest.  No excess mucus, notes  productive cough,  Notes  non-productive cough,  No coughing up of blood.  No change in color of mucus.  No wheezing.  No chest wall deformity  Skin: no rash or lesions.  GU: no dysuria, change in color of urine, no urgency or frequency.  No flank pain.  MS:  No joint pain or swelling.  No decreased range of motion.  No back pain.  Psych:  No change in mood or affect. No depression or anxiety.  No memory loss.     Objective:   Physical Exam Filed Vitals:   05/04/11 1506  BP: 110/70  Pulse: 83  Temp: 98.5 F (36.9 C)  TempSrc: Oral  Height: 4\' 9"  (1.448 m)  Weight: 126 lb (57.153 kg)  SpO2: 93%    Gen: Pleasant, well-nourished, in no distress,  normal affect  ENT: No lesions,  mouth clear,  oropharynx clear, no postnasal drip  Neck: No JVD, no TMG, no carotid bruits  Lungs: No use of accessory muscles, no dullness to percussion, dry rales, poor airflow  Cardiovascular: RRR, heart sounds normal, no murmur or gallops, no peripheral edema  Abdomen: soft and NT, no HSM,  BS normal  Musculoskeletal: No deformities, no cyanosis or clubbing  Neuro: alert, non focal  Skin: Warm, no lesions or rashes        Assessment & Plan:  SATURATION QUALIFICATIONS:  Patient Saturations on Room Air at Rest = 92%  Patient Saturations on Room Air while Ambulating = 86%  Patient Saturations on 2 Liters of oxygen while Ambulating = 94%  PULMONARY FIBROSIS, POSTINFLAMMATORY Acute fibrosis flare d/t rheumatoid arthritis and associated  recent acute diverticulitis flare Progressive acute on chronic respiratory failure Note moderate restriction on 05/04/11 spirometry Plan Start oxygen therapy Pulse prednisone Stay on symbicort Chest xray 05/04/11     Updated Medication List Outpatient Encounter Prescriptions as of 05/04/2011  Medication Sig Dispense Refill  . budesonide-formoterol (SYMBICORT) 160-4.5 MCG/ACT inhaler Inhale  2 puffs into the lungs 2 (two) times daily.        . ciprofloxacin (CIPRO) 500 MG tablet Take 500 mg by mouth 2 (two) times daily.       . fluticasone (VERAMYST) 27.5 MCG/SPRAY nasal spray Place 2 sprays into the nose daily as needed.        . leflunomide (ARAVA) 20 MG tablet Take 20 mg by mouth daily.        . metoprolol tartrate (LOPRESSOR) 25 MG tablet Take 1 tablet (25 mg total) by mouth 2 (two) times daily.  60 tablet  5  . metroNIDAZOLE (FLAGYL) 500 MG tablet Take 500 mg by mouth 3 (three) times daily.       . Multiple Vitamins-Minerals (CENTRUM SILVER PO) Take 1 tablet by mouth daily.        . Omega-3 Fatty Acids (FISH OIL) 1200 MG CAPS Take by mouth daily.        . promethazine (PHENERGAN) 25 MG tablet Take 25 mg by mouth every 6 (six) hours as needed.        . rosuvastatin (CRESTOR) 20 MG tablet Take 1 tablet (20 mg total) by mouth at bedtime.  30 tablet  5  . predniSONE (DELTASONE) 10 MG tablet Take 4 for three days, 3 for three days, 2 for three days, 1 for three days and stop   30 tablet  0

## 2011-05-04 NOTE — Telephone Encounter (Signed)
ATC no answer was unable to leave [facility] wcb

## 2011-05-04 NOTE — Patient Instructions (Addendum)
A chest xray will be obtained today Oxygen will be started 2Liter rest , 3Liter exertion Prednisone 10mg  Take 4 for three days, 3 for three days, 2 for three days, 1 for three days and stop Stay on symbicort Return 2 weeks in [place]

## 2011-05-05 NOTE — Telephone Encounter (Signed)
Spoke to pt and she states she is doing much better with the o2 dr Delford Field put her on and she has a f/u appt with pw on 05/18/11--not sure why msg stated she needed appt--per pt just letting us know she is feeling better and verifying her appt

## 2011-05-12 ENCOUNTER — Encounter: Payer: Self-pay | Admitting: Cardiology

## 2011-05-18 ENCOUNTER — Ambulatory Visit: Payer: Medicare Other | Admitting: Critical Care Medicine

## 2011-05-19 ENCOUNTER — Other Ambulatory Visit: Payer: Self-pay | Admitting: Adult Health

## 2011-05-19 ENCOUNTER — Ambulatory Visit (INDEPENDENT_AMBULATORY_CARE_PROVIDER_SITE_OTHER): Payer: Medicare Other | Admitting: Adult Health

## 2011-05-19 ENCOUNTER — Ambulatory Visit: Payer: Medicare Other | Admitting: Adult Health

## 2011-05-19 ENCOUNTER — Encounter: Payer: Self-pay | Admitting: Adult Health

## 2011-05-19 VITALS — BP 124/80 | HR 83 | Temp 98.2°F | Ht <= 58 in | Wt 122.0 lb

## 2011-05-19 DIAGNOSIS — J8410 Pulmonary fibrosis, unspecified: Secondary | ICD-10-CM

## 2011-05-19 NOTE — Patient Instructions (Addendum)
Continue on Symbicort -brush since and gargle after use  May take Oxygen off at rest wear 2l/m at bedtime and 3 liters with activity.  Follow up Dr. Delford Field  In 6 weeks at [facility] and As needed   You have Rheumatoid Arthritis  induced interstitial lung disease. -scarring in your lungs

## 2011-05-19 NOTE — Progress Notes (Signed)
Subjective:    Patient ID: Briana Ray, female    DOB: October 29, 1923, 77 y.o.   MRN: [id number]  HPI  77 y.o.   WF with interstital changes on CT chest in setting of RA. Has RA induced interstitial lung disease.  Past Hx pertinent for severe Rheumatoid arthritis Dx 74yrs ago. Rx with Leflunamide. Not on methotrexate.   March 13, 2010  The pt feels she is worsening. There is more difficulty breathing and coughing more. The cough is productive of minimal white mucus. The pt hurts all the time in all the joints. The joints are swelling more. The pt did benefit from embrel but could not afford. The pt is more fatigued. When the pt gets up the pt is tired. No real chest pain. Mucus is minimal. Arthritis is worse. Hurts all the time. Joints swell.  Was on embrel but could not afford.   May 01, 2010 Developed a sinus infection, then developed a stroke: confusion, memory issues, disequilibrium. Was not hospitalized. Two weeks went by and saw PCP and he Dx this. Had an MRI of the head.  Pt is is still on an Abx for the sinuses: cough, nose is draining and runs, voice is lost. Cough is productive whitish yellow.  Is now off ASA and plavix and is on new med for CVA. Pt to call back with the names. Pt is on Dulera and this helps the breathing better than the symbicort did.   July 03, 2010  The dry cough is a major issue. The pts eyes have improved. no more sinus infxn. The pt still gets dizzy, falls easily. Pt has a cane but does not use the cane. Still on dulera and this has helped   October 02, 2010 The dulera helps but the pt cannot afford. No new issues. No cough. No excess mucus. No wheeze.  symbicort does not work as well.    05/04/2011 Since 1/12 much worse.  On ABX and severe cough that wont go away. Cough is dry, occ prod, mucus is clear.  Notes more dyspnea esp with deep breath.  No edema in feet.  No real heartburn.   >>steroid taper and o2 started 2 l/ rest, 3l/ act   05/19/2011  Follow up  Pt returns with family for follow up . She returns feeling bettter with less dyspnea and cough . No fever or discolored mucus  Recent divertic flare that is resolving with left of gas /bloating . Being tx by Dr. Tenny Craw.  Tolerating O2 started last ov except for nasal dryness. We discussed saline spray and gel.  Sats on room air today in office ~94%. Walking 90-92% -unable to walk far without giving out joint issues (no cane) and dyspnea.     Past Medical History  Diagnosis Date  . Coronary heart disease   . CVA (cerebral infarction) 04/25/10    MRI L parietal lobe infarction   . Hyperlipidemia   . Hypertension   . Diabetes mellitus, type 2   . Hypothyroid   . GERD (gastroesophageal reflux disease)   . 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 Rheumatoid arthritis     on Arava. Dx for 20 yrs     Family History  Problem Relation Age of Onset  . Heart disease Mother   . Heart disease Brother     x 5  . Heart disease Sister     x 5  . Heart disease  Father      History   Social History  . Marital Status: Widowed    Spouse Name: N/A    Number of Children: N/A  . Years of Education: N/A   Occupational History  . Retired     [employer]   Social History Main Topics  . Smoking status: Never Smoker   . Smokeless tobacco: Never Used  . Alcohol Use: Yes     rare  . Drug Use: Not on file  . Sexually Active: Not on file   Other Topics Concern  . Not on file   Social History Narrative  . No narrative on file     Allergies  Allergen Reactions  . RUE:[id number] Acid+Aspartame     REACTION: diarrhea     Outpatient Prescriptions Prior to Visit  Medication Sig Dispense Refill  . acetaminophen (TYLENOL ARTHRITIS PAIN) 650 MG CR tablet Take 650 mg by mouth as needed.        . budesonide-formoterol (SYMBICORT) 160-4.5 MCG/ACT inhaler Inhale 2 puffs into the lungs 2 (two) times daily.        Marland Kitchen  dipyridamole-aspirin (AGGRENOX) 25-200 MG per 12 hr capsule Take 1 capsule by mouth 2 (two) times daily.        . fluticasone (VERAMYST) 27.5 MCG/SPRAY nasal spray Place 2 sprays into the nose daily as needed.        . leflunomide (ARAVA) 20 MG tablet Take 20 mg by mouth daily.        Marland Kitchen levothyroxine (SYNTHROID, LEVOTHROID) 50 MCG tablet Take 50 mcg by mouth daily.        Marland Kitchen loperamide (IMODIUM A-D) 2 MG tablet Take 2 mg by mouth as needed.        . metoprolol (TOPROL-XL) 50 MG 24 hr tablet Take 50 mg by mouth 2 (two) times daily.        . metoprolol tartrate (LOPRESSOR) 25 MG tablet Take 1 tablet (25 mg total) by mouth 2 (two) times daily.  60 tablet  5  . Mometasone Furo-Formoterol Fum (DULERA) 200-5 MCG/ACT AERO Inhale 2 puffs into the lungs 2 (two) times daily.        . Multiple Vitamins-Minerals (CENTRUM SILVER PO) Take 1 tablet by mouth daily.        . Omega-3 Fatty Acids (FISH OIL) 1200 MG CAPS Take by mouth daily.        . predniSONE (DELTASONE) 10 MG tablet Take 4 for three days, 3 for three days, 2 for three days, 1 for three days and stop   30 tablet  0  . promethazine (PHENERGAN) 25 MG tablet Take 25 mg by mouth every 6 (six) hours as needed.        . rosuvastatin (CRESTOR) 20 MG tablet Take 1 tablet (20 mg total) by mouth at bedtime.  30 tablet  5     Review of Systems  Constitutional:   No  weight loss, night sweats,  Fevers, chills, fatigue, lassitude.  HEENT:   No headaches,  Difficulty swallowing,  Tooth/dental problems,  Sore throat,                No sneezing, itching, ear ache, nasal congestion, post nasal drip,   CV:  No chest pain,  Orthopnea, PND, swelling in lower extremities, anasarca, dizziness, palpitations  GI  No heartburn, indigestion,  vomiting, diarrhea,   loss of appetite  Resp: Notes  shortness of breath with exertion not  at rest.  No excess mucus,  notes  productive cough,  Notes  non-productive cough,  No coughing up of blood.  No change in color of  mucus.  No wheezing.  No chest wall deformity  Skin: no rash or lesions.  GU: no dysuria, change in color of urine, no urgency or frequency.  No flank pain.  MS:  +chronic  joint pain     Psych:  No change in mood or affect. No depression or anxiety.  No memory loss.     Objective:   Physical Exam  Filed Vitals:   05/19/11 1516  BP: 124/80  Pulse: 83  Temp: 98.2 F (36.8 C)  TempSrc: Oral  Height: 4\' 9"  (1.448 m)  Weight: 122 lb (55.339 kg)  SpO2: 96%    Gen: Pleasant, well-nourished, in no distress,  normal affect  ENT: No lesions,  mouth clear,  oropharynx clear, no postnasal drip  Neck: No JVD, no TMG, no carotid bruits  Lungs: No use of accessory muscles, no dullness to percussion, dry rales, poor airflow  Cardiovascular: RRR, heart sounds normal, no murmur or gallops, no peripheral edema  Abdomen: soft and NT, no HSM,  BS normal  Musculoskeletal: No deformities, no cyanosis or clubbing  Neuro: alert, non focal  Skin: Warm, no lesions or rashes        Assessment & Plan:    No problem-specific assessment & plan notes found for this encounter.   Updated Medication List Outpatient Encounter Prescriptions as of 05/19/2011  Medication Sig Dispense Refill  . acetaminophen (TYLENOL ARTHRITIS PAIN) 650 MG CR tablet Take 650 mg by mouth as needed.        . budesonide-formoterol (SYMBICORT) 160-4.5 MCG/ACT inhaler Inhale 2 puffs into the lungs 2 (two) times daily.        Marland Kitchen dipyridamole-aspirin (AGGRENOX) 25-200 MG per 12 hr capsule Take 1 capsule by mouth 2 (two) times daily.        . fluticasone (VERAMYST) 27.5 MCG/SPRAY nasal spray Place 2 sprays into the nose daily as needed.        . leflunomide (ARAVA) 20 MG tablet Take 20 mg by mouth daily.        Marland Kitchen levothyroxine (SYNTHROID, LEVOTHROID) 50 MCG tablet Take 50 mcg by mouth daily.        Marland Kitchen loperamide (IMODIUM A-D) 2 MG tablet Take 2 mg by mouth as needed.        . metoprolol (TOPROL-XL) 50 MG 24 hr tablet  Take 50 mg by mouth 2 (two) times daily.        . metoprolol tartrate (LOPRESSOR) 25 MG tablet Take 1 tablet (25 mg total) by mouth 2 (two) times daily.  60 tablet  5  . Mometasone Furo-Formoterol Fum (DULERA) 200-5 MCG/ACT AERO Inhale 2 puffs into the lungs 2 (two) times daily.        . Multiple Vitamins-Minerals (CENTRUM SILVER PO) Take 1 tablet by mouth daily.        . Omega-3 Fatty Acids (FISH OIL) 1200 MG CAPS Take by mouth daily.        . predniSONE (DELTASONE) 10 MG tablet Take 4 for three days, 3 for three days, 2 for three days, 1 for three days and stop   30 tablet  0  . promethazine (PHENERGAN) 25 MG tablet Take 25 mg by mouth every 6 (six) hours as needed.        . rosuvastatin (CRESTOR) 20 MG tablet Take 1 tablet (20 mg total) by mouth at bedtime.  30 tablet  5

## 2011-05-19 NOTE — Assessment & Plan Note (Addendum)
RA  REcent flare with associated activity now resolved with steroid taper and new O2 start  O2 sats ok at room air at rest CXR last ov with no acute changes no sign progression of ILD noted.   Plan;  No further steroids at this time May leave O2 off at rest. Wear with act and At bedtime   Cont on symbicort

## 2011-06-19 ENCOUNTER — Ambulatory Visit (INDEPENDENT_AMBULATORY_CARE_PROVIDER_SITE_OTHER): Payer: Medicare Other | Admitting: Critical Care Medicine

## 2011-06-19 ENCOUNTER — Encounter: Payer: Self-pay | Admitting: Critical Care Medicine

## 2011-06-19 VITALS — BP 110/68 | HR 67 | Temp 98.0°F | Ht <= 58 in | Wt 125.6 lb

## 2011-06-19 DIAGNOSIS — J8410 Pulmonary fibrosis, unspecified: Secondary | ICD-10-CM

## 2011-06-19 NOTE — Progress Notes (Signed)
Subjective:    Patient ID: Briana Ray, female    DOB: Mar 12, 1924, 77 y.o.   MRN: [id number]  HPI  77 y.o.   WF with interstital changes on CT chest in setting of RA. Has RA induced interstitial lung disease.  Past Hx pertinent for severe Rheumatoid arthritis Dx 44yrs ago. Rx with Leflunamide. Not on methotrexate.   05/04/2011 Since 1/12 much worse.  On ABX and severe cough that wont go away. Cough is dry, occ prod, mucus is clear.  Notes more dyspnea esp with deep breath.  No edema in feet.  No real heartburn.   >>steroid taper and o2 started 2 l/ rest, 3l/ act   8/21 Follow up  Pt returns with family for follow up . She returns feeling bettter with less dyspnea and cough . No fever or discolored mucus  Recent divertic flare that is resolving with left of gas /bloating . Being tx by Dr. Tenny Craw.  Tolerating O2 started last ov except for nasal dryness. We discussed saline spray and gel.  Sats on room air today in office ~94%. Walking 90-92% -unable to walk far without giving out joint issues (no cane) and dyspnea.   9/21 Cough is better, dyspnea is better.  Had diverticulitis over this time.  Now on remecade.  Seems better after remecade.  Off 12 weeks off remecade Now ant abd hurts .      Past Medical History  Diagnosis Date  . Coronary heart disease   . CVA (cerebral infarction) 04/25/10    MRI L parietal lobe infarction   . Hyperlipidemia   . Hypertension   . Diabetes mellitus, type 2   . Hypothyroid   . GERD (gastroesophageal reflux disease)   . 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 Rheumatoid arthritis     on Arava. Dx for 20 yrs     Family History  Problem Relation Age of Onset  . Heart disease Mother   . Heart disease Brother     x 5  . Heart disease Sister     x 5  . Heart disease Father      History   Social History  . Marital Status: Widowed    Spouse Name: N/A    Number of Children: N/A  . Years of Education:  N/A   Occupational History  . Retired     [employer]   Social History Main Topics  . Smoking status: Never Smoker   . Smokeless tobacco: Never Used  . Alcohol Use: Yes     rare  . Drug Use: Not on file  . Sexually Active: Not on file   Other Topics Concern  . Not on file   Social History Narrative  . No narrative on file     Allergies  Allergen Reactions  . WUJ:[id number]+AOZHYQMVH+QIONGEXBMW Acid+Aspartame     REACTION: diarrhea     Outpatient Prescriptions Prior to Visit  Medication Sig Dispense Refill  . budesonide-formoterol (SYMBICORT) 160-4.5 MCG/ACT inhaler Inhale 2 puffs into the lungs 2 (two) times daily.        Marland Kitchen dipyridamole-aspirin (AGGRENOX) 25-200 MG per 12 hr capsule Take 1 capsule by mouth 2 (two) times daily.        Marland Kitchen leflunomide (ARAVA) 20 MG tablet Take 10 mg by mouth daily.       Marland Kitchen loperamide (IMODIUM A-D) 2 MG tablet Take 2 mg by mouth as needed.        Marland Kitchen  metoprolol tartrate (LOPRESSOR) 25 MG tablet Take 1 tablet (25 mg total) by mouth 2 (two) times daily.  60 tablet  5  . Multiple Vitamins-Minerals (CENTRUM SILVER PO) Take 1 tablet by mouth daily.        . Omega-3 Fatty Acids (FISH OIL) 1200 MG CAPS Take by mouth daily.        . promethazine (PHENERGAN) 25 MG tablet Take 25 mg by mouth every 6 (six) hours as needed.        . rosuvastatin (CRESTOR) 20 MG tablet Take 1 tablet (20 mg total) by mouth at bedtime.  30 tablet  5  . acetaminophen (TYLENOL ARTHRITIS PAIN) 650 MG CR tablet Take 650 mg by mouth as needed.        . fluticasone (VERAMYST) 27.5 MCG/SPRAY nasal spray Place 2 sprays into the nose daily as needed.        Marland Kitchen levothyroxine (SYNTHROID, LEVOTHROID) 50 MCG tablet Take 50 mcg by mouth daily.        . metoprolol (TOPROL-XL) 50 MG 24 hr tablet Take 50 mg by mouth 2 (two) times daily.        . Mometasone Furo-Formoterol Fum (DULERA) 200-5 MCG/ACT AERO Inhale 2 puffs into the lungs 2 (two) times daily.        . predniSONE (DELTASONE) 10 MG  tablet Take 4 for three days, 3 for three days, 2 for three days, 1 for three days and stop   30 tablet  0     Review of Systems  Constitutional:   No  weight loss, night sweats,  Fevers, chills, fatigue, lassitude.  HEENT:   No headaches,  Difficulty swallowing,  Tooth/dental problems,  Sore throat,                No sneezing, itching, ear ache, nasal congestion, post nasal drip,   CV:  No chest pain,  Orthopnea, PND, swelling in lower extremities, anasarca, dizziness, palpitations  GI  No heartburn, indigestion,  vomiting, diarrhea,   loss of appetite  Resp: Notes  shortness of breath with exertion not  at rest.  No excess mucus, no  productive cough,  Notes  non-productive cough,  No coughing up of blood.  No change in color of mucus.  No wheezing.  No chest wall deformity  Skin: no rash or lesions.  GU: no dysuria, change in color of urine, no urgency or frequency.  No flank pain.  MS:  +chronic  joint pain     Psych:  No change in mood or affect. No depression or anxiety.  No memory loss.     Objective:   Physical Exam  Filed Vitals:   06/19/11 1531  BP: 110/68  Pulse: 67  Temp: 98 F (36.7 C)  TempSrc: Oral  Height: 4\' 9"  (1.448 m)  Weight: 125 lb 9.6 oz (56.972 kg)  SpO2: 97%    Gen: Pleasant, well-nourished, in no distress,  normal affect  ENT: No lesions,  mouth clear,  oropharynx clear, no postnasal drip  Neck: No JVD, no TMG, no carotid bruits  Lungs: No use of accessory muscles, no dullness to percussion, dry rales, poor airflow  Cardiovascular: RRR, heart sounds normal, no murmur or gallops, no peripheral edema  Abdomen: soft and NT, no HSM,  BS normal  Musculoskeletal: No deformities, no cyanosis or clubbing  Neuro: alert, non focal  Skin: Warm, no lesions or rashes        Assessment & Plan:    PULMONARY  FIBROSIS, POSTINFLAMMATORY Pulm fibrosis with pos response to symbicort and recent pred pulse now off pred Plan No change in inhaled  or maintenance medications. Return in  3 months      Updated Medication List Outpatient Encounter Prescriptions as of 06/19/2011  Medication Sig Dispense Refill  . budesonide-formoterol (SYMBICORT) 160-4.5 MCG/ACT inhaler Inhale 2 puffs into the lungs 2 (two) times daily.        Marland Kitchen dipyridamole-aspirin (AGGRENOX) 25-200 MG per 12 hr capsule Take 1 capsule by mouth 2 (two) times daily.        . InFLIXimab (REMICADE IV) Inject into the vein. IV every 6 wks       . leflunomide (ARAVA) 20 MG tablet Take 10 mg by mouth daily.       Marland Kitchen loperamide (IMODIUM A-D) 2 MG tablet Take 2 mg by mouth as needed.        . metoprolol tartrate (LOPRESSOR) 25 MG tablet Take 1 tablet (25 mg total) by mouth 2 (two) times daily.  60 tablet  5  . Multiple Vitamins-Minerals (CENTRUM SILVER PO) Take 1 tablet by mouth daily.        . Omega-3 Fatty Acids (FISH OIL) 1200 MG CAPS Take by mouth daily.        . promethazine (PHENERGAN) 25 MG tablet Take 25 mg by mouth every 6 (six) hours as needed.        . rosuvastatin (CRESTOR) 20 MG tablet Take 1 tablet (20 mg total) by mouth at bedtime.  30 tablet  5  . DISCONTD: acetaminophen (TYLENOL ARTHRITIS PAIN) 650 MG CR tablet Take 650 mg by mouth as needed.        Marland Kitchen DISCONTD: fluticasone (VERAMYST) 27.5 MCG/SPRAY nasal spray Place 2 sprays into the nose daily as needed.        Marland Kitchen DISCONTD: levothyroxine (SYNTHROID, LEVOTHROID) 50 MCG tablet Take 50 mcg by mouth daily.        Marland Kitchen DISCONTD: metoprolol (TOPROL-XL) 50 MG 24 hr tablet Take 50 mg by mouth 2 (two) times daily.        Marland Kitchen DISCONTD: Mometasone Furo-Formoterol Fum (DULERA) 200-5 MCG/ACT AERO Inhale 2 puffs into the lungs 2 (two) times daily.        Marland Kitchen DISCONTD: predniSONE (DELTASONE) 10 MG tablet Take 4 for three days, 3 for three days, 2 for three days, 1 for three days and stop   30 tablet  0

## 2011-06-19 NOTE — Patient Instructions (Signed)
You may be off oxygen during the day, wear at night Stay on Symbicort two puff twice daily No prednisone for now Return 3 months

## 2011-06-20 NOTE — Assessment & Plan Note (Signed)
Pulm fibrosis with pos response to symbicort and recent pred pulse now off pred Plan No change in inhaled or maintenance medications. Return in  3 months

## 2011-06-25 ENCOUNTER — Emergency Department (INDEPENDENT_AMBULATORY_CARE_PROVIDER_SITE_OTHER): Payer: Medicare Other

## 2011-06-25 ENCOUNTER — Encounter (HOSPITAL_BASED_OUTPATIENT_CLINIC_OR_DEPARTMENT_OTHER): Payer: Self-pay

## 2011-06-25 ENCOUNTER — Emergency Department (HOSPITAL_BASED_OUTPATIENT_CLINIC_OR_DEPARTMENT_OTHER)
Admission: EM | Admit: 2011-06-25 | Discharge: 2011-06-25 | Disposition: A | Discharge status: Home / Self Care | Payer: Medicare Other | Attending: Emergency Medicine | Admitting: Emergency Medicine

## 2011-06-25 DIAGNOSIS — I2510 Atherosclerotic heart disease of native coronary artery without angina pectoris: Secondary | ICD-10-CM | POA: Insufficient documentation

## 2011-06-25 DIAGNOSIS — M069 Rheumatoid arthritis, unspecified: Secondary | ICD-10-CM | POA: Insufficient documentation

## 2011-06-25 DIAGNOSIS — M1610 Unilateral primary osteoarthritis, unspecified hip: Secondary | ICD-10-CM

## 2011-06-25 DIAGNOSIS — M79609 Pain in unspecified limb: Secondary | ICD-10-CM | POA: Insufficient documentation

## 2011-06-25 DIAGNOSIS — M25559 Pain in unspecified hip: Secondary | ICD-10-CM

## 2011-06-25 DIAGNOSIS — E119 Type 2 diabetes mellitus without complications: Secondary | ICD-10-CM | POA: Insufficient documentation

## 2011-06-25 DIAGNOSIS — Z8679 Personal history of other diseases of the circulatory system: Secondary | ICD-10-CM | POA: Insufficient documentation

## 2011-06-25 DIAGNOSIS — M7150 Other bursitis, not elsewhere classified, unspecified site: Secondary | ICD-10-CM | POA: Insufficient documentation

## 2011-06-25 DIAGNOSIS — M169 Osteoarthritis of hip, unspecified: Secondary | ICD-10-CM

## 2011-06-25 DIAGNOSIS — Z79899 Other long term (current) drug therapy: Secondary | ICD-10-CM | POA: Insufficient documentation

## 2011-06-25 DIAGNOSIS — M06259 Rheumatoid bursitis, unspecified hip: Secondary | ICD-10-CM

## 2011-06-25 DIAGNOSIS — E785 Hyperlipidemia, unspecified: Secondary | ICD-10-CM | POA: Insufficient documentation

## 2011-06-25 DIAGNOSIS — R269 Unspecified abnormalities of gait and mobility: Secondary | ICD-10-CM | POA: Insufficient documentation

## 2011-06-25 HISTORY — DX: Aneurysm of unspecified site: I72.9

## 2011-06-25 MED ORDER — PREDNISONE 10 MG PO TABS: 20.0000 mg | ORAL_TABLET | Freq: Every day | ORAL | Status: AC

## 2011-06-25 MED ORDER — IBUPROFEN 800 MG PO TABS
800.0000 mg | ORAL_TABLET | Freq: Once | ORAL | Status: DC
  Filled 2011-06-25: qty 1

## 2011-06-25 MED ORDER — OXYCODONE-ACETAMINOPHEN 5-325 MG PO TABS
2.0000 | ORAL_TABLET | Freq: Once | ORAL | Status: AC
  Administered 2011-06-25: 2 via ORAL
  Filled 2011-06-25: qty 2

## 2011-06-25 MED ORDER — OXYCODONE-ACETAMINOPHEN 5-325 MG PO TABS: 2.0000 | ORAL_TABLET | ORAL | Status: AC | PRN

## 2011-06-25 NOTE — ED Notes (Signed)
Pts family states she cannot take Ibuprofen due to interference with other medications she is taking.

## 2011-06-25 NOTE — ED Notes (Signed)
Oxygen at [facility] applied per pt request.  Home O2 dependent qhs.

## 2011-06-25 NOTE — ED Notes (Signed)
Onset of severe left hip and leg pain radiating to knee that started last night.  Denies injury.

## 2011-06-25 NOTE — ED Provider Notes (Signed)
History     CSN: [id number] Arrival date & time: 06/25/2011 10:22 AM  Chief Complaint  Patient presents with  . Hip Pain  . Leg Pain    (Consider location/radiation/quality/duration/timing/severity/associated sxs/prior treatment) HPI Comments: Ran numerous amount of errands yesterday. Awoke with right hip pain radiating to the groin. Pain radiates also into the knee. She has no known trauma. Pain worse with range of motion and upon standing. Her daughter-in-law states that she has been less active until recently due to numerous illnesses. On home oxygen  Patient is a 77 y.o. female presenting with hip pain. The history is provided by the patient. No language interpreter was used.  Hip Pain This is a new problem. The current episode started 3 to 5 hours ago. The problem occurs constantly. The problem has been gradually worsening. Pertinent negatives include no chest pain, no abdominal pain, no headaches and no shortness of breath. The symptoms are aggravated by walking, twisting and bending (when standing). The symptoms are relieved by lying down. She has tried nothing for the symptoms. The treatment provided no relief.    Past Medical History  Diagnosis Date  . Coronary heart disease   . CVA (cerebral infarction) 04/25/10    MRI L parietal lobe infarction   . Hyperlipidemia   . Hypertension   . Diabetes mellitus, type 2   . Hypothyroid   . GERD (gastroesophageal reflux disease)   . 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 Rheumatoid arthritis     on Arava. Dx for 20 yrs  . Aneurysm     Past Surgical History  Procedure Date  . Breast reduction surgery   . Cholecystectomy   . Appendectomy   . Coronary artery bypass graft 1998    valve repair  . Cardiac stents   . Umbilical hernia repair   . Tonsillectomy   . Vesicovaginal fistula closure w/ tah   . Carpal tunnel release   . Abdominal hysterectomy     Family History  Problem Relation  Age of Onset  . Heart disease Mother   . Heart disease Brother     x 5  . Heart disease Sister     x 5  . Heart disease Father     History  Substance Use Topics  . Smoking status: Never Smoker   . Smokeless tobacco: Never Used  . Alcohol Use: Yes     rare    OB History    Grav Para Term Preterm Abortions TAB SAB Ect Mult Living                  Review of Systems  Constitutional: Negative for fever, activity change, appetite change and fatigue.  HENT: Negative for congestion, sore throat, rhinorrhea, neck pain and neck stiffness.   Respiratory: Negative for cough, chest tightness and shortness of breath.   Cardiovascular: Negative for chest pain and palpitations.  Gastrointestinal: Negative for nausea, vomiting and abdominal pain.  Genitourinary: Negative for dysuria, urgency, frequency and flank pain.  Musculoskeletal: Positive for back pain, arthralgias and gait problem. Negative for myalgias and joint swelling.  Skin: Negative for pallor and rash.  Neurological: Negative for dizziness, weakness, light-headedness, numbness and headaches.  All other systems reviewed and are negative.    Allergies  JYN:WGNFAOZHYQM+VHQIONGEX+BMWUXLKGMW acid+aspartame  Home Medications   Current Outpatient Rx  Name Route Sig Dispense Refill  . BUDESONIDE-FORMOTEROL FUMARATE 160-4.5 MCG/ACT IN AERO Inhalation Inhale 2 puffs into  the lungs 2 (two) times daily.      . ASPIRIN-DIPYRIDAMOLE 25-200 MG PO CP12 Oral Take 1 capsule by mouth 2 (two) times daily.      Marland Kitchen REMICADE IV Intravenous Inject into the vein. IV every 6 wks     . LEFLUNOMIDE 20 MG PO TABS Oral Take 10 mg by mouth daily.     Marland Kitchen LOPERAMIDE HCL 2 MG PO TABS Oral Take 2 mg by mouth as needed.      Marland Kitchen METOPROLOL TARTRATE 25 MG PO TABS Oral Take 1 tablet (25 mg total) by mouth 2 (two) times daily. 60 tablet 5  . CENTRUM SILVER PO Oral Take 1 tablet by mouth daily.      Marland Kitchen FISH OIL 1200 MG PO CAPS Oral Take by mouth daily.      .  OXYCODONE-ACETAMINOPHEN 5-325 MG PO TABS Oral Take 2 tablets by mouth every 4 (four) hours as needed for pain. 15 tablet 0  . PREDNISONE 10 MG PO TABS Oral Take 2 tablets (20 mg total) by mouth daily. 14 tablet 0  . PROMETHAZINE HCL 25 MG PO TABS Oral Take 25 mg by mouth every 6 (six) hours as needed.      Marland Kitchen ROSUVASTATIN CALCIUM 20 MG PO TABS Oral Take 1 tablet (20 mg total) by mouth at bedtime. 30 tablet 5    BP 138/77  Pulse 67  Temp(Src) 98.1 F (36.7 C) (Oral)  Resp 20  Ht 4\' 11"  (1.499 m)  SpO2 93%  Physical Exam  Nursing note and vitals reviewed. Constitutional: She is oriented to person, place, and time. She appears well-developed and well-nourished. No distress.  HENT:  Head: Normocephalic and atraumatic.  Mouth/Throat: Oropharynx is clear and moist.  Eyes: Conjunctivae and EOM are normal. Pupils are equal, round, and reactive to light.  Neck: Normal range of motion. Neck supple.  Cardiovascular: Normal rate, regular rhythm, normal heart sounds and intact distal pulses.  Exam reveals no gallop and no friction rub.   No murmur heard. Pulmonary/Chest: Effort normal and breath sounds normal. No respiratory distress.  Abdominal: Soft. Bowel sounds are normal. There is no tenderness.  Musculoskeletal:       Right hip: She exhibits decreased range of motion and tenderness (present over the sciatic nerve distribution of the right buttocks as well as the right trochanteric bursa). She exhibits no bony tenderness, no swelling, no deformity and no laceration.  Neurological: She is alert and oriented to person, place, and time.  Skin: Skin is warm and dry. No rash noted.    ED Course  Procedures (including critical care time)  Labs Reviewed - No data to display Dg Hip Complete Right  06/25/2011  *RADIOLOGY REPORT*  Clinical Data: Right hip pain  RIGHT HIP - COMPLETE 2+ VIEW  Comparison: None.  Findings: Hips are located.  Dedicated view of the right hip demonstrates no femoral neck  fracture.  There is medial joint space narrowing on the left and right.  Osteopenia noted.  No pelvic fracture.  IMPRESSION:  1.  No evidence right hip fracture.  2. No evidence of pelvic fracture. 3.  Moderate osteoarthritis of the hip joints.  Original Report Authenticated By: Genevive Bi, M.D.     1. Hip arthritis   2. Rheumatoid bursitis of hip       MDM  Patient was able to ambulate successfully. She did have pain. Imaging with no acute injury. She has osteoarthritis of the hip joint. On examination she had  pain over the right trochanteric bursa which I feel is explained by rheumatoid bursitis. She'll be placed on a steroid as well as pain medication. She may also have a component of sciatica in the location of her pain. She was instructed to followup with her primary care physician and was provided signs and symptoms for which to return to the emergency department        Dayton Bailiff, MD 06/25/11 1247

## 2011-06-25 NOTE — ED Notes (Signed)
Pt transported to radiology.

## 2011-06-25 NOTE — ED Notes (Signed)
PO fluids provided. 

## 2011-06-25 NOTE — ED Notes (Signed)
Pt returned from radiology and no change in symptoms.

## 2011-07-15 ENCOUNTER — Ambulatory Visit (INDEPENDENT_AMBULATORY_CARE_PROVIDER_SITE_OTHER): Payer: Medicare Other | Admitting: Cardiology

## 2011-07-15 ENCOUNTER — Encounter: Payer: Self-pay | Admitting: Cardiology

## 2011-07-15 VITALS — BP 110/80 | HR 67 | Resp 20 | Ht 59.0 in | Wt 123.0 lb

## 2011-07-15 DIAGNOSIS — I714 Abdominal aortic aneurysm, without rupture: Secondary | ICD-10-CM

## 2011-07-15 DIAGNOSIS — I2510 Atherosclerotic heart disease of native coronary artery without angina pectoris: Secondary | ICD-10-CM

## 2011-07-15 DIAGNOSIS — I10 Essential (primary) hypertension: Secondary | ICD-10-CM

## 2011-07-15 DIAGNOSIS — E785 Hyperlipidemia, unspecified: Secondary | ICD-10-CM

## 2011-07-15 MED ORDER — METOPROLOL TARTRATE 25 MG PO TABS: 25.0000 mg | ORAL_TABLET | Freq: Two times a day (BID) | ORAL | Status: DC

## 2011-07-15 MED ORDER — ROSUVASTATIN CALCIUM 20 MG PO TABS: 20.0000 mg | ORAL_TABLET | Freq: Every day | ORAL | Status: DC

## 2011-07-15 NOTE — Assessment & Plan Note (Signed)
Abdominal ultrasound March 2013.

## 2011-07-15 NOTE — Assessment & Plan Note (Signed)
Continue statin. Check lipids and liver. 

## 2011-07-15 NOTE — Assessment & Plan Note (Signed)
Continue aspirin and statin. Possible Myoview when she returns in 6 months.

## 2011-07-15 NOTE — Assessment & Plan Note (Signed)
Blood pressure controlled. Continue present medications. 

## 2011-07-15 NOTE — Patient Instructions (Signed)
Your physician wants you to follow-up in: 6 months You will receive a reminder letter in the mail two months in advance. If you don't receive a letter, please call our office to schedule the follow-up appointment.   Your physician recommends that you return for lab work in: when fasting

## 2011-07-15 NOTE — Progress Notes (Signed)
HPI; Pleasant female for fu of coronary artery disease. Status post coronary artery bypass graft and valve repair in 1998. Patient had a LIMA to the LAD, saphenous vein graft to PDA, saphenous vein graft to the intermediate and saphenous vein graft to the marginal. Last cardiac catheterization was performed in 2005 and revealed severe three-vessel obstructive coronary artery disease. Patent saphenous vein graft to the posterior descending artery but with high-grade stenosis at the anastomosis site. Patent left internal mammary artery graft to the left anterior descending. Two occluded vein grafts presumably to intermediate and marginal vessels. Mild left ventricular dysfunction. Successful stenting of the saphenous vein graft to the posterior descending artery at the distal anastomosis.  Patient last seen in Feb 2012. Note abdominal CT in March of 2012 showed a 3.8 x 3.5 cm abdominal aortic aneurysm. There is also moderate to marked narrowing of the left iliac. Since then, she does have dyspnea on exertion which is unchanged and felt secondary to pulmonary fibrosis. There is no orthopnea, PND, pedal edema or exertional chest pain.  Current Outpatient Prescriptions  Medication Sig Dispense Refill  . budesonide-formoterol (SYMBICORT) 160-4.5 MCG/ACT inhaler Inhale 2 puffs into the lungs 2 (two) times daily.        Marland Kitchen dipyridamole-aspirin (AGGRENOX) 25-200 MG per 12 hr capsule Take 1 capsule by mouth 2 (two) times daily.        . InFLIXimab (REMICADE IV) Inject into the vein. IV every 6 wks       . leflunomide (ARAVA) 20 MG tablet Take 10 mg by mouth daily.       Marland Kitchen loperamide (IMODIUM A-D) 2 MG tablet Take 2 mg by mouth as needed.        . metoprolol tartrate (LOPRESSOR) 25 MG tablet Take 1 tablet (25 mg total) by mouth 2 (two) times daily.  60 tablet  5  . Multiple Vitamins-Minerals (CENTRUM SILVER PO) Take 1 tablet by mouth daily.        . NON FORMULARY Oxygen 3 litters during the day and 2 litters at  bedtime       . Omega-3 Fatty Acids (FISH OIL) 1200 MG CAPS Take by mouth daily.        . rosuvastatin (CRESTOR) 20 MG tablet Take 1 tablet (20 mg total) by mouth at bedtime.  30 tablet  5     Past Medical History  Diagnosis Date  . Coronary heart disease   . CVA (cerebral infarction) 04/25/10    MRI L parietal lobe infarction   . Hyperlipidemia   . Hypertension   . Diabetes mellitus, type 2   . Hypothyroid   . GERD (gastroesophageal reflux disease)   . 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 Rheumatoid arthritis     on Arava. Dx for 20 yrs  . Aneurysm     Past Surgical History  Procedure Date  . Breast reduction surgery   . Cholecystectomy   . Appendectomy   . Coronary artery bypass graft 1998    valve repair  . Cardiac stents   . Umbilical hernia repair   . Tonsillectomy   . Vesicovaginal fistula closure w/ tah   . Carpal tunnel release   . Abdominal hysterectomy     History   Social History  . Marital Status: Widowed    Spouse Name: N/A    Number of Children: N/A  . Years of Education: N/A   Occupational History  . Retired  [employer]   Social History Main Topics  . Smoking status: Never Smoker   . Smokeless tobacco: Never Used  . Alcohol Use: Yes     rare  . Drug Use: Not on file  . Sexually Active: Not on file   Other Topics Concern  . Not on file   Social History Narrative  . No narrative on file    ROS: arthritis but no fevers or chills, productive cough, hemoptysis, dysphasia, odynophagia, melena, hematochezia, dysuria, hematuria, rash, seizure activity, orthopnea, PND, pedal edema, claudication. Remaining systems are negative.  Physical Exam: Well-developed well-nourished in no acute distress.  Skin is warm and dry.  HEENT is normal.  Neck is supple. No thyromegaly.  Chest is diffuse dry crackles. Cardiovascular exam is regular rate and rhythm. 2/6 systolic murmur left sternal  border. Abdominal exam nontender or distended. No masses palpated. Extremities show no edema. neuro grossly intact  ECG normal sinus rhythm at a rate of 67. First degree AV block. RV conduction delay.

## 2011-07-16 LAB — HEPATIC FUNCTION PANEL
AST: 31 U/L (ref 0–37)
Alkaline Phosphatase: 54 U/L (ref 39–117)
Bilirubin, Direct: 0.1 mg/dL (ref 0.0–0.3)
Indirect Bilirubin: 0.3 mg/dL (ref 0.0–0.9)
Total Bilirubin: 0.4 mg/dL (ref 0.3–1.2)

## 2011-07-21 MED ORDER — ROSUVASTATIN CALCIUM 40 MG PO TABS: 40.0000 mg | ORAL_TABLET | Freq: Every day | ORAL | Status: DC

## 2011-07-21 NOTE — Progress Notes (Signed)
Addended by: Scherrie Bateman E on: 07/21/2011 12:14 PM   Modules accepted: Orders

## 2011-08-05 ENCOUNTER — Other Ambulatory Visit: Payer: Self-pay

## 2011-08-05 ENCOUNTER — Encounter (HOSPITAL_BASED_OUTPATIENT_CLINIC_OR_DEPARTMENT_OTHER): Payer: Self-pay | Admitting: *Deleted

## 2011-08-05 ENCOUNTER — Emergency Department (INDEPENDENT_AMBULATORY_CARE_PROVIDER_SITE_OTHER): Payer: Medicare Other

## 2011-08-05 ENCOUNTER — Observation Stay (HOSPITAL_BASED_OUTPATIENT_CLINIC_OR_DEPARTMENT_OTHER)
Admission: EM | Admit: 2011-08-05 | Discharge: 2011-08-06 | Disposition: A | Discharge status: Home / Self Care | Payer: Medicare Other | Source: Ambulatory Visit | Attending: Cardiology | Admitting: Cardiology

## 2011-08-05 DIAGNOSIS — R0602 Shortness of breath: Secondary | ICD-10-CM

## 2011-08-05 DIAGNOSIS — R079 Chest pain, unspecified: Secondary | ICD-10-CM | POA: Insufficient documentation

## 2011-08-05 DIAGNOSIS — I714 Abdominal aortic aneurysm, without rupture, unspecified: Secondary | ICD-10-CM | POA: Insufficient documentation

## 2011-08-05 DIAGNOSIS — M79609 Pain in unspecified limb: Secondary | ICD-10-CM

## 2011-08-05 DIAGNOSIS — I517 Cardiomegaly: Secondary | ICD-10-CM

## 2011-08-05 DIAGNOSIS — I2510 Atherosclerotic heart disease of native coronary artery without angina pectoris: Secondary | ICD-10-CM | POA: Insufficient documentation

## 2011-08-05 DIAGNOSIS — I10 Essential (primary) hypertension: Secondary | ICD-10-CM | POA: Diagnosis present

## 2011-08-05 DIAGNOSIS — J8410 Pulmonary fibrosis, unspecified: Secondary | ICD-10-CM | POA: Diagnosis present

## 2011-08-05 DIAGNOSIS — R059 Cough, unspecified: Secondary | ICD-10-CM

## 2011-08-05 DIAGNOSIS — E119 Type 2 diabetes mellitus without complications: Secondary | ICD-10-CM | POA: Diagnosis present

## 2011-08-05 DIAGNOSIS — Z8673 Personal history of transient ischemic attack (TIA), and cerebral infarction without residual deficits: Secondary | ICD-10-CM | POA: Insufficient documentation

## 2011-08-05 DIAGNOSIS — I200 Unstable angina: Secondary | ICD-10-CM

## 2011-08-05 DIAGNOSIS — E785 Hyperlipidemia, unspecified: Secondary | ICD-10-CM

## 2011-08-05 DIAGNOSIS — M069 Rheumatoid arthritis, unspecified: Secondary | ICD-10-CM | POA: Insufficient documentation

## 2011-08-05 DIAGNOSIS — R05 Cough: Secondary | ICD-10-CM

## 2011-08-05 HISTORY — DX: Acute myocardial infarction, unspecified: I21.9

## 2011-08-05 LAB — CBC
Hemoglobin: 12.4 g/dL (ref 12.0–15.0)
MCH: 32.1 pg (ref 26.0–34.0)
MCV: 97.7 fL (ref 78.0–100.0)
RBC: 3.86 MIL/uL — ABNORMAL LOW (ref 3.87–5.11)

## 2011-08-05 LAB — CARDIAC PANEL(CRET KIN+CKTOT+MB+TROPI)
CK, MB: 2.7 ng/mL (ref 0.3–4.0)
Total CK: 77 U/L (ref 7–177)
Troponin I: <0.3 ng/mL (ref <0.30)

## 2011-08-05 LAB — BASIC METABOLIC PANEL
BUN: 19 mg/dL (ref 6–23)
Calcium: 9.9 mg/dL (ref 8.4–10.5)
GFR calc non Af Amer: 81 mL/min — ABNORMAL LOW (ref >90)
Glucose, Bld: 149 mg/dL — ABNORMAL HIGH (ref 70–99)

## 2011-08-05 LAB — MRSA PCR SCREENING: MRSA by PCR: NEGATIVE

## 2011-08-05 LAB — DIFFERENTIAL
Eosinophils Absolute: 0.1 10*3/uL (ref 0.0–0.7)
Eosinophils Relative: 1 % (ref 0–5)
Lymphs Abs: 2.8 10*3/uL (ref 0.7–4.0)
Monocytes Relative: 13 % — ABNORMAL HIGH (ref 3–12)

## 2011-08-05 LAB — GLUCOSE, CAPILLARY: Glucose-Capillary: 149 mg/dL — ABNORMAL HIGH (ref 70–99)

## 2011-08-05 MED ORDER — ASPIRIN 300 MG RE SUPP: 300.0000 mg | RECTAL | Status: DC

## 2011-08-05 MED ORDER — LEVOTHYROXINE SODIUM 50 MCG PO TABS
50.0000 ug | ORAL_TABLET | Freq: Every day | ORAL | Status: DC
  Administered 2011-08-05 – 2011-08-06 (×2): 50 ug via ORAL
  Filled 2011-08-05 (×2): qty 1

## 2011-08-05 MED ORDER — CAPSAICIN 0.025 % EX CREA
TOPICAL_CREAM | Freq: Two times a day (BID) | CUTANEOUS | Status: DC | PRN
  Filled 2011-08-05: qty 56.6

## 2011-08-05 MED ORDER — ACETAMINOPHEN 325 MG PO TABS
650.0000 mg | ORAL_TABLET | ORAL | Status: DC | PRN
  Administered 2011-08-05 – 2011-08-06 (×2): 650 mg via ORAL
  Filled 2011-08-05 (×2): qty 2

## 2011-08-05 MED ORDER — ASPIRIN-DIPYRIDAMOLE ER 25-200 MG PO CP12
1.0000 | ORAL_CAPSULE | Freq: Two times a day (BID) | ORAL | Status: DC
  Administered 2011-08-05 – 2011-08-06 (×2): 1 via ORAL
  Filled 2011-08-05 (×5): qty 1

## 2011-08-05 MED ORDER — HEPARIN (PORCINE) IN NACL 100-0.45 UNIT/ML-% IJ SOLN
12.0000 [IU]/kg/h | Freq: Once | INTRAMUSCULAR | Status: AC
  Administered 2011-08-05: 12.868 [IU]/kg/h via INTRAVENOUS
  Filled 2011-08-05: qty 250

## 2011-08-05 MED ORDER — ONDANSETRON HCL 4 MG/2ML IJ SOLN: 4.0000 mg | Freq: Four times a day (QID) | INTRAMUSCULAR | Status: DC | PRN

## 2011-08-05 MED ORDER — NITROGLYCERIN IN D5W 200-5 MCG/ML-% IV SOLN
2.0000 ug/min | Freq: Once | INTRAVENOUS | Status: AC
  Administered 2011-08-05: 5 ug/min via INTRAVENOUS
  Filled 2011-08-05: qty 250

## 2011-08-05 MED ORDER — SODIUM CHLORIDE 0.9 % IJ SOLN: 3.0000 mL | INTRAMUSCULAR | Status: DC | PRN

## 2011-08-05 MED ORDER — NITROGLYCERIN 2 % TD OINT
1.0000 [in_us] | TOPICAL_OINTMENT | Freq: Four times a day (QID) | TRANSDERMAL | Status: DC
  Administered 2011-08-05: 07:00:00 via TOPICAL
  Filled 2011-08-05: qty 1

## 2011-08-05 MED ORDER — HEPARIN BOLUS VIA INFUSION
4000.0000 [IU] | Freq: Once | INTRAVENOUS | Status: AC
  Administered 2011-08-05: 4000 [IU] via INTRAVENOUS
  Filled 2011-08-05: qty 4000

## 2011-08-05 MED ORDER — ASPIRIN 81 MG PO CHEW: 324.0000 mg | CHEWABLE_TABLET | ORAL | Status: DC

## 2011-08-05 MED ORDER — MORPHINE SULFATE 2 MG/ML IJ SOLN
2.0000 mg | Freq: Once | INTRAMUSCULAR | Status: AC
  Administered 2011-08-05: 2 mg via INTRAVENOUS
  Filled 2011-08-05: qty 1

## 2011-08-05 MED ORDER — ASPIRIN EC 81 MG PO TBEC
81.0000 mg | DELAYED_RELEASE_TABLET | Freq: Every day | ORAL | Status: DC
Start: 2011-08-06 — End: 2011-08-06
  Filled 2011-08-05: qty 1

## 2011-08-05 MED ORDER — LEFLUNOMIDE 20 MG PO TABS
10.0000 mg | ORAL_TABLET | Freq: Every day | ORAL | Status: DC
  Administered 2011-08-06: 10 mg via ORAL
  Filled 2011-08-05 (×2): qty 1

## 2011-08-05 MED ORDER — GLIPIZIDE ER 2.5 MG PO TB24
2.5000 mg | ORAL_TABLET | Freq: Every day | ORAL | Status: DC
  Administered 2011-08-06: 2.5 mg via ORAL
  Filled 2011-08-05 (×3): qty 1

## 2011-08-05 MED ORDER — ASPIRIN 81 MG PO CHEW
324.0000 mg | CHEWABLE_TABLET | Freq: Once | ORAL | Status: AC
  Administered 2011-08-05: 324 mg via ORAL
  Filled 2011-08-05: qty 4

## 2011-08-05 MED ORDER — MORPHINE SULFATE 2 MG/ML IJ SOLN
INTRAMUSCULAR | Status: AC
  Administered 2011-08-05: 2 mg via INTRAVENOUS
  Filled 2011-08-05: qty 1

## 2011-08-05 MED ORDER — FISH OIL 1200 MG PO CAPS: 1.0000 | ORAL_CAPSULE | Freq: Every day | ORAL | Status: DC

## 2011-08-05 MED ORDER — SODIUM CHLORIDE 0.9 % IV SOLN
Freq: Once | INTRAVENOUS | Status: AC
  Administered 2011-08-05: 09:00:00 via INTRAVENOUS

## 2011-08-05 MED ORDER — SODIUM CHLORIDE 0.9 % IJ SOLN
3.0000 mL | Freq: Two times a day (BID) | INTRAMUSCULAR | Status: DC
  Administered 2011-08-05 – 2011-08-06 (×2): 3 mL via INTRAVENOUS

## 2011-08-05 MED ORDER — ROSUVASTATIN CALCIUM 40 MG PO TABS
40.0000 mg | ORAL_TABLET | Freq: Every day | ORAL | Status: DC
  Administered 2011-08-05: 40 mg via ORAL
  Filled 2011-08-05 (×2): qty 1

## 2011-08-05 MED ORDER — OMEGA-3-ACID ETHYL ESTERS 1 G PO CAPS
1.0000 g | ORAL_CAPSULE | Freq: Every day | ORAL | Status: DC
  Administered 2011-08-06: 1 g via ORAL
  Filled 2011-08-05 (×2): qty 1

## 2011-08-05 MED ORDER — METOPROLOL TARTRATE 25 MG PO TABS
25.0000 mg | ORAL_TABLET | Freq: Two times a day (BID) | ORAL | Status: DC
  Administered 2011-08-06: 25 mg via ORAL
  Filled 2011-08-05 (×3): qty 1

## 2011-08-05 MED ORDER — BUDESONIDE-FORMOTEROL FUMARATE 160-4.5 MCG/ACT IN AERO
2.0000 | INHALATION_SPRAY | Freq: Two times a day (BID) | RESPIRATORY_TRACT | Status: DC
  Administered 2011-08-05: 2 via RESPIRATORY_TRACT
  Filled 2011-08-05: qty 6

## 2011-08-05 MED ORDER — INSULIN ASPART 100 UNIT/ML ~~LOC~~ SOLN
0.0000 [IU] | Freq: Three times a day (TID) | SUBCUTANEOUS | Status: DC
  Administered 2011-08-06: 2 [IU] via SUBCUTANEOUS
  Filled 2011-08-05: qty 3

## 2011-08-05 MED ORDER — MORPHINE SULFATE 2 MG/ML IJ SOLN
2.0000 mg | Freq: Once | INTRAMUSCULAR | Status: AC
  Administered 2011-08-05: 2 mg via INTRAVENOUS

## 2011-08-05 MED ORDER — NITROGLYCERIN 0.4 MG SL SUBL: 0.4000 mg | SUBLINGUAL_TABLET | SUBLINGUAL | Status: DC | PRN

## 2011-08-05 NOTE — ED Notes (Signed)
Lunch provided.  Pt informed of plan of care.  Pt medicated and new orders received.

## 2011-08-05 NOTE — ED Notes (Signed)
Report received from Talbert Nan, RN care assumed.  Pt is CAOx4 and in NAD.  She reports pain in left arm 7/10.  NSR per monitor and family at bedside.

## 2011-08-05 NOTE — H&P (Signed)
Patient seen and examined and history reviewed. Agree with above findings and plan. Patient well known to me. Significant history of rheumatoid arthritis with lung involvement on O2. Presents with atypical left forearm pain. This is localized and reproduced with palpation consistent with musculoskeletal pain. Will check serial cardiac enzymes and Ecg. If negative could be DC in am for outpatient myoview.   [name] 08/05/2011 3:47 PM

## 2011-08-05 NOTE — ED Notes (Signed)
Dr.Knapp at bedside  

## 2011-08-05 NOTE — ED Notes (Signed)
Snacks and PO fluid provided. EMTALA consent obtained and awaiting transfer.

## 2011-08-05 NOTE — ED Notes (Signed)
Pt states she did take two nitroglycerin pills last night without relief of pain.

## 2011-08-05 NOTE — H&P (Signed)
Patient ID: Briana Ray MRN: [id number], DOB/AGE: 75/27/1925   Admit date: 08/05/2011 Date of Consult: @TODAY @  Primary Physician: Miguel Aschoff, MD Primary Cardiologist: Olga Millers  Pt. Profile: 77 y/o female w/ h/o CAD s/p CABG who presents on transfer from [facility] with left arm pain.  Problem List: Past Medical History  Diagnosis Date  . Coronary heart disease       Coronary artery bypass graft x4: lima-lad, vg-om, vg-ri, vg-pda 1998   valve repair      . 2005 - inf STEMI -  cath: 3vd w/ occluded vg-om and ri.  Patent lima-lad; tight ostial stenosis in vg-pda - s/p pci  2.5 x 16.0-mm  TAXUS drug-eluting stent   CVA (cerebral infarction) 04/25/10    MRI L parietal lobe infarction   . Hyperlipidemia   . Diabetes mellitus, type 2   . GERD (gastroesophageal reflux disease)   . 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 Rheumatoid arthritis     on Arava. Dx for 20 yrs  . Aneurysm   . Myocardial infarction     Past Surgical History  Procedure Date  . Breast reduction surgery   . Cholecystectomy   . Appendectomy   . Coronary artery bypass graft x4: lima-lad, vg-om, vg-ri, vg-pda 1998    valve repair  . Cardiac stents   . Umbilical hernia repair   . Tonsillectomy   . Vesicovaginal fistula closure w/ tah   . Carpal tunnel release   . Abdominal hysterectomy      Allergies:  Allergies  Allergen Reactions  . WUJ:WJXBJYNWGNF+AOZHYQMVH+QIONGEXBMW Acid+Aspartame     REACTION: diarrhea    HPI:   77 y/o female with above problem list who was in her usual state of health until about 10p last night when she began to experience intermittent, fleeting,  left upper forearm, discomfort without assoc. Ss.  Pain would come on @ rest, become "severe", last a few seconds, and resolve spont but would return within a minute or two.  This cycle of Ss went on all night and she could never really get comfortable enough to sleep.  Pain was not  necessarily worse with position changes or palpation and also was not like prior angina (though she did have left arm pain with 1st MI in the 90's).  She presented to [facility] this am for eval.  Ti NL, ecg non acute.  She was transferred here for cardiac eval.  She had two fleeting episodes of c/p during my interview.  Pain was focal and reproducible on my exam.     Family History  Problem Relation Age of Onset  . Heart disease Mother   . Heart disease Brother     x 5  . Heart disease Sister     x 5  . Heart disease Father         Social History  . Marital Status: Widowed    Spouse Name: N/A    Number of Children: N/A  . Years of Education: N/A   Occupational History  . Retired     [employer]   Social History Main Topics  . Smoking status: Never Smoker   . Smokeless tobacco: Never Used  . Alcohol Use: No     rare  . Drug Use: Not on file  . Sexually Active: Not on file   Other Topics Concern  . Not on file   Social History Narrative  . No  narrative on file    Outpatient Meds:  budesonide-formoterol (SYMBICORT) 160-4.5 MCG/ACT inhaler  Inhale 2 puffs into the lungs 2 (two) times daily.  Marland Kitchen  dipyridamole-aspirin (AGGRENOX) 25-200 MG per 12 hr capsule  Take 1 capsule by mouth 2 (two) times daily.  .  InFLIXimab (REMICADE IV)  Inject into the vein. IV every 6 wks  .  leflunomide (ARAVA) 20 MG tablet  Take 10 mg by mouth daily.  Marland Kitchen  loperamide (IMODIUM A-D) 2 MG tablet  Take 2 mg by mouth as needed.  .  metoprolol tartrate (LOPRESSOR) 25 MG tablet  Take 1 tablet (25 mg total) by mouth 2 (two) times daily.  60 tablet   .  Multiple Vitamins-Minerals (CENTRUM SILVER PO)  Take 1 tablet by mouth daily.  .  NON FORMULARY  Oxygen 3 litters during the day and 2 litters at bedtime  .  Omega-3 Fatty Acids (FISH OIL) 1200 MG CAPS  Take by mouth daily.  .  rosuvastatin (CRESTOR) 40 MG tablet  Take 1 tablet (20 mg total) by mouth at bedtime.      Review  of Systems: General: negative for chills, fever, night sweats or weight changes.  Cardiovascular: negative for chest pain, dyspnea on exertion, edema, orthopnea, palpitations, paroxysmal nocturnal dyspnea or shortness of breath Dermatological: negative for rash Respiratory: chronic DOE - wears 02 for activity and sleep (3L and 2L respectively). Urologic: negative for hematuria, dysuria. Abdominal: negative for nausea, vomiting, diarrhea, bright red blood per rectum, melena, or hematemesis Neurologic: negative for visual changes, syncope, or dizziness MSK: Chronic L>R knee pain and swelling as well as Bilat hip pain. All other systems reviewed and are otherwise negative except as noted above.  Physical Exam: Blood pressure 137/73, pulse 56, temperature 98.5 F (36.9 C), temperature source Oral, resp. rate 15, height 4\' 11"  (1.499 m), weight 123 lb 14.4 oz (56.2 kg), SpO2 100.00%.  General: Well developed, well nourished, in no acute distress. Head: Normocephalic, atraumatic, sclera non-icteric, no xanthomas, nares are without discharge.  Neck: Negative for carotid bruits. JVD not elevated. Lungs:  Breathing is unlabored.  Bilat crackles all the way up. Heart: RRR with S1 S2. No murmurs, rubs, or gallops appreciated. Abdomen: Soft, non-tender, non-distended with normoactive bowel sounds. No hepatomegaly. No rebound/guarding. No obvious abdominal masses. Msk:  Strength and tone appears normal for age. Extremities: No clubbing, cyanosis or edema.  Distal pedal pulses are 2+ and equal bilaterally.  Reproducible Left forearm pain with palpation. Neuro: Alert and oriented X 3. Moves all extremities spontaneously. Psych:  Responds to questions appropriately with a normal affect.  Labs:   Lab Results  Component Value Date   WBC 7.7 08/05/2011   HGB 12.4 08/05/2011   HCT 37.7 08/05/2011   MCV 97.7 08/05/2011   PLT 200 08/05/2011    Lab 08/05/11 0710  NA 138  K 4.0  CL 105  CO2 25  BUN 19    CREATININE 0.60  CALCIUM 9.9  PROT --  BILITOT --  ALKPHOS --  ALT --  AST --  GLUCOSE 149*   Lab Results  Component Value Date   TROPONINI <0.30 08/05/2011   Radiology/Studies: Dg Chest Portable 1 View  08/05/2011  *RADIOLOGY REPORT*  Clinical Data: Chest pain, cough, and shortness of breath.  History of pulmonary fibrosis.  PORTABLE CHEST - 1 VIEW  Comparison: 05/04/2011  Findings: Chronic mild cardiomegaly.  Evidence of prosthetic mitral valve and CABG.  Vascularity is normal.  Chronic accentuation of  the interstitial markings consistent with pulmonary fibrosis.  No effusions.  IMPRESSION: No acute abnormality.  Chronic heart and lung disease.  Original Report Authenticated By: Gwynn Burly, M.D.    EKG: SB, 58, poor  R progression.  No acute changes.   ASSESSMENT AND PLAN:   Left Arm Pain: Reproducible with palpation - suspect MSK.  Different from prior angina. First Ti negative despite nearly 15hrs of pain.  ECG w/o acute changes.  D/C IV ntg (started in ER).  Plan to d/c heparin if next Ti negative.  Admit, cycle CE, cont home meds.  If CE remain neg, plan early d/c and outpt f/u.  Of note, Dr Jens Som was planning a MV in 6 mos, will plan to do this sooner.  HTN:  Stable.  HL: Cont statin.  RA:  Cont Arava.  H/O CVA: on Aggrenox.  Rheumatoid Lung: O2.     Signed, Nicolasa Ducking NP 08/05/2011, 3:02 PM

## 2011-08-05 NOTE — ED Notes (Signed)
Preparing pt for transfer to [facility] for admission.  Pt and family informed of plan of care.

## 2011-08-05 NOTE — ED Notes (Signed)
Pt reports left arm pain that began last night. Family reports pt has had 2 previous heart attacks with this same symptom. Pt denies CP or worsened SOB.

## 2011-08-05 NOTE — ED Notes (Signed)
PO fluids provided. 

## 2011-08-05 NOTE — ED Notes (Signed)
Report given to [facility].  Pt stable upon transport .  Report to be called to 2900 unit receiving nurse.

## 2011-08-05 NOTE — ED Notes (Signed)
Peripheral IV's charted as d/c'd for transfer but actually no d/c'd. IV sites WNL

## 2011-08-05 NOTE — ED Notes (Signed)
I ran new ECG on portable monitor for transmission to [facility] for Cardiac Dr. By request of nurse. I made copy and placed in file.

## 2011-08-05 NOTE — ED Notes (Signed)
Pt assisted to BR via wheelchair and back to room.

## 2011-08-05 NOTE — ED Provider Notes (Addendum)
History     CSN: [id number] Arrival date & time: 08/05/2011  6:47 AM   First MD Initiated Contact with Patient 08/05/11 [id number]      Chief Complaint  Patient presents with  . Arm Pain    (Consider location/radiation/quality/duration/timing/severity/associated sxs/prior treatment) The history is provided by the patient (And her son).   The patient states she developed left upper extremity pain starting last night. Patient states the pain was severe enough that she could not sleep well. The pain is somewhat in her neck and does radiate down her arm as well. It has been constant in nature. She has not had any shortness of breath, nausea, vomiting, weakness, fevers or chills. Patient did try some nitroglycerin without relief. Nothing seems to make it better or worse. It does not increase with movement or palpation. Exertion does not make it worse either.  The patient does not recall the details of her pain with her bypass with stents. Her son however states that what she is describing is very similar to the pain she experienced at that time. She never had chest pain with those episodes. Patient is not sure if this pain is from her arthritis or from her heart disease. Past Medical History  Diagnosis Date  . Coronary heart disease   . CVA (cerebral infarction) 04/25/10    MRI L parietal lobe infarction   . Hyperlipidemia   . Diabetes mellitus, type 2   . GERD (gastroesophageal reflux disease)   . 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 Rheumatoid arthritis     on Arava. Dx for 20 yrs  . Aneurysm   . Myocardial infarction     Past Surgical History  Procedure Date  . Breast reduction surgery   . Cholecystectomy   . Appendectomy   . Coronary artery bypass graft 1998    valve repair  . Cardiac stents   . Umbilical hernia repair   . Tonsillectomy   . Vesicovaginal fistula closure w/ tah   . Carpal tunnel release   . Abdominal hysterectomy      Family History  Problem Relation Age of Onset  . Heart disease Mother   . Heart disease Brother     x 5  . Heart disease Sister     x 5  . Heart disease Father     History  Substance Use Topics  . Smoking status: Never Smoker   . Smokeless tobacco: Never Used  . Alcohol Use: No     rare    OB History    Grav Para Term Preterm Abortions TAB SAB Ect Mult Living                  Review of Systems  All other systems reviewed and are negative.    Allergies  WGN:FAOZHYQMVHQ+IONGEXBMW+UXLKGMWNUU acid+aspartame  Home Medications   Current Outpatient Rx  Name Route Sig Dispense Refill  . LEVOTHYROXINE SODIUM 50 MCG PO TABS Oral Take 50 mcg by mouth daily.      Marland Kitchen METOPROLOL TARTRATE 50 MG PO TABS Oral Take 50 mg by mouth 2 (two) times daily.      . MOMETASONE FURO-FORMOTEROL FUM 100-5 MCG/ACT IN AERO Inhalation Inhale 2 puffs into the lungs 2 (two) times daily.      Marland Kitchen ROSUVASTATIN CALCIUM 20 MG PO TABS Oral Take 20 mg by mouth daily.      . BUDESONIDE-FORMOTEROL FUMARATE 160-4.5 MCG/ACT IN AERO Inhalation Inhale  2 puffs into the lungs 2 (two) times daily.      . ASPIRIN-DIPYRIDAMOLE 25-200 MG PO CP12 Oral Take 1 capsule by mouth 2 (two) times daily.      Marland Kitchen REMICADE IV Intravenous Inject into the vein. IV every 6 wks     . LEFLUNOMIDE 20 MG PO TABS Oral Take 10 mg by mouth daily.     Marland Kitchen LOPERAMIDE HCL 2 MG PO TABS Oral Take 2 mg by mouth as needed.      Marland Kitchen METOPROLOL TARTRATE 25 MG PO TABS Oral Take 1 tablet (25 mg total) by mouth 2 (two) times daily. 60 tablet 12  . CENTRUM SILVER PO Oral Take 1 tablet by mouth daily.      . NON FORMULARY  Oxygen 3 litters during the day and 2 litters at bedtime     . FISH OIL 1200 MG PO CAPS Oral Take by mouth daily.      Marland Kitchen ROSUVASTATIN CALCIUM 40 MG PO TABS Oral Take 1 tablet (40 mg total) by mouth daily. 30 tablet 11    BP 165/67  Pulse 60  Temp(Src) 98 F (36.7 C) (Oral)  Ht 4\' 11"  (1.499 m)  Wt 120 lb (54.432 kg)  BMI 24.24  kg/m2  SpO2 96%  Physical Exam  Nursing note and vitals reviewed. Constitutional: She appears well-developed and well-nourished. No distress.  HENT:  Head: Normocephalic and atraumatic.  Right Ear: External ear normal.  Left Ear: External ear normal.  Eyes: Conjunctivae are normal. Right eye exhibits no discharge. Left eye exhibits no discharge. No scleral icterus.  Neck: Neck supple. No tracheal deviation present.  Cardiovascular: Normal rate, regular rhythm and intact distal pulses.   Pulmonary/Chest: Effort normal and breath sounds normal. No stridor. No respiratory distress. She has no wheezes. She has no rales.  Abdominal: Soft. Bowel sounds are normal. She exhibits no distension. There is no tenderness. There is no rebound and no guarding.  Musculoskeletal: She exhibits no edema.       Mild tenderness to palpation left elbow, left upper trauma knee; full range of motion, no erythema, no effusion, distal pulses are normal throughout all extremities.  Neurological: She is alert. She has normal strength. No sensory deficit. Cranial nerve deficit:  no gross defecits noted. She exhibits normal muscle tone. She displays no seizure activity. Coordination normal.  Skin: Skin is warm and dry. No rash noted.  Psychiatric: She has a normal mood and affect.    ED Course  Procedures (including critical care time)  Date: 08/05/2011  Rate: 61  Rhythm: normal sinus rhythm  QRS Axis: right  Intervals: Incomplete right bundle branch block  ST/T Wave abnormalities: nonspecific T wave changes  Conduction Disutrbances:Incomplete right bundle branch block  Narrative Interpretation:   Old EKG Reviewed: changes noted nonspecific T wave changes inferiorly   Labs Reviewed  CBC - Abnormal; Notable for the following:    RBC 3.86 (*)    All other components within normal limits  DIFFERENTIAL - Abnormal; Notable for the following:    Monocytes Relative 13 (*)    All other components within normal  limits  BASIC METABOLIC PANEL - Abnormal; Notable for the following:    Glucose, Bld 149 (*)    GFR calc non Af Amer 81 (*)    All other components within normal limits  PROTIME-INR  TROPONIN I  APTT  TROPONIN I   Dg Chest Portable 1 View  08/05/2011  *RADIOLOGY REPORT*  Clinical Data: Chest  pain, cough, and shortness of breath.  History of pulmonary fibrosis.  PORTABLE CHEST - 1 VIEW  Comparison: 05/04/2011  Findings: Chronic mild cardiomegaly.  Evidence of prosthetic mitral valve and CABG.  Vascularity is normal.  Chronic accentuation of the interstitial markings consistent with pulmonary fibrosis.  No effusions.  IMPRESSION: No acute abnormality.  Chronic heart and lung disease.  Original Report Authenticated By: Gwynn Burly, M.D.     Medications          nitroGLYCERIN (NITROGLYN) 2 % ointment 1 inch ( inch Topical Given 08/05/11 0720)  morphine 2 MG/ML injection 2 mg (not administered)  aspirin chewable tablet 324 mg (324 mg Oral Given 08/05/11 0719)  morphine 2 MG/ML injection 2 mg (2 mg Intravenous Given 08/05/11 0720)   8:39 AM patient still having pain. Another dose of morphine will be ordered. She has not noticed much improvement with the nitroglycerin and aspirin.  MDM   Patient presents with upper extremity pain it could be an anginal equivalent. Her symptoms are somewhat atypical and it certainly could be related to her arthritis however the family says this is a similar presentation to her prior heart disease. At this time solely based on the lab testing I certainly cannot exclude acute coronary syndrome as the etiology for her pain. At this time I will continue her treatment and consult with our cardiology for probable admission and further evaluation.     8:52 AM the case was discussed with Dr. [place]. The patient was transferred to [facility] for further evaluation. A heparin infusion has been ordered. 11:40 AM  Still having intermittent pain.  Repeat  troponin is normal.  Will change to ntg drip. Additional morphine ordered Diagnosis: Unstable angina  Celene Kras, MD 08/05/11 1610  Celene Kras, MD 08/05/11 1140

## 2011-08-05 NOTE — ED Notes (Signed)
MD at bedside. 

## 2011-08-06 DIAGNOSIS — R079 Chest pain, unspecified: Secondary | ICD-10-CM

## 2011-08-06 LAB — CARDIAC PANEL(CRET KIN+CKTOT+MB+TROPI)
Relative Index: INVALID (ref 0.0–2.5)
Troponin I: <0.3 ng/mL (ref <0.30)
Troponin I: <0.3 ng/mL (ref <0.30)

## 2011-08-06 MED ORDER — ROSUVASTATIN CALCIUM 40 MG PO TABS: 40.0000 mg | ORAL_TABLET | Freq: Every day | ORAL | Status: DC

## 2011-08-06 NOTE — Progress Notes (Signed)
Pt verbalizes readiness to go home; verbalizes understanding of discharge instruction; son re-arranged follow up appointments to meet his schedule; pt taken to car via w/c; no problems  Lavone Orn 08/06/2011

## 2011-08-06 NOTE — Discharge Instructions (Signed)
Acute Coronary Syndrome Acute coronary syndrome (ACS) is an urgent problem in which the blood and oxygen supply to the heart is critically deficient. ACS requires hospitalization because one or more coronary arteries may be blocked. ACS represents a range of conditions including:  Previous angina that is now unstable, lasts longer, happens at rest, or is more intense.   A heart attack, with heart muscle cell injury and death.  There are three vital coronary arteries that supply the heart muscle with blood and oxygen so that it can pump blood effectively. If blockages to these arteries develop, blood flow to the heart muscle is reduced. If the heart does not get enough blood, angina may occur as the first warning sign. SYMPTOMS   The most common signs of angina include:   Tightness or squeezing in the chest.   Feeling of heaviness on the chest.   Discomfort in the arms, neck, or jaw.   Shortness of breath and nausea.   Cold, wet skin.   Angina is usually brought on by physical effort or excitement which increase the oxygen needs of the heart. These states increase the blood flow needs of the heart beyond what can be delivered.  TREATMENT   Medicines to help discomfort may include nitroglycerin (nitro) in the form of tablets or a spray for rapid relief, or longer-acting forms such as cream, patches, or capsules. (Be aware that there are many side effects and possible interactions with other drugs).   Other medicines may be used to help the heart pump better.   Procedures to open blocked arteries including angioplasty or stent placement to keep the arteries open.   Open heart surgery may be needed when there are many blockages or they are in critical locations that are best treated with surgery.  HOME CARE INSTRUCTIONS   Avoid smoking.   Take one baby or adult aspirin daily, if your caregiver advises. This helps reduce the risk of a heart attack.   It is very important that you  follow the angina treatment prescribed by your caregiver. Make arrangements for proper follow-up care.   Eat a heart healthy diet with salt and fat restrictions as advised.   Regular exercise is good for you as long as it does not cause discomfort. Do not begin any new type of exercise until you check with your caregiver.   If you are overweight, you should lose weight.   Try to maintain normal blood lipid levels.   Keep your blood pressure under control as recommended by your caregiver.   You should tell your caregiver right away about any increase in the severity or frequency of your chest discomfort or angina attacks. When you have angina, you should stop what you are doing and sit down. This may bring relief in 3 to 5 minutes. If your caregiver has prescribed nitro, take it as directed.   If your caregiver has given you a follow-up appointment, it is very important to keep that appointment. Not keeping the appointment could result in a chronic or permanent injury, pain, and disability. If there is any problem keeping the appointment, you must call back to this facility for assistance.  SEEK IMMEDIATE MEDICAL CARE IF:   You develop nausea, vomiting, or shortness of breath.   You feel faint, lightheaded, or pass out.   Your chest discomfort gets worse.   You are sweating or experience sudden profound fatigue.   You do not get relief of your chest pain after 3 doses  of nitro.   Your discomfort lasts longer than 15 minutes.  MAKE SURE YOU:   Understand these instructions.   Will watch your condition.   Will get help right away if you are not doing well or get worse.  Document Released: 09/14/2005 Document Revised: 05/27/2011 Document Reviewed: 04/17/2008 [facility] Patient Information 2012 [place], [place].Angina Angina is discomfort caused by inadequate oxygen delivery to the heart muscle. Angina is a response to blockage or narrowing of the coronary arteries. It alerts you about a  blood flow problem to your heart. New, more frequent, or severe angina is a warning signal for you to seek medical care right away.  CAUSES The coronary arteries supply blood and oxygen to the heart muscle. The heart muscle needs a constant supply of oxygen in order to pump blood to the body. These arteries often become blocked by hardened deposits of blood products (plaque) including fat and cholesterol. The following contribute to the formation of plaque:  High cholesterol.   High blood pressure.   Smoking.   Obesity.   Diabetes.  SYMPTOMS   The most common problem is a deep discomfort in the center of your chest. The discomfort may also be felt in, or move to your:   Arms (especially the left one).   Throat.   Jaw.   Back.   Upper stomach.   Angina may be brought on by exercise, emotional upset, heavy meals, or extremes of heat or cold.   It may resolve within 5 to 10 minutes after a period of rest.   Angina symptoms vary from person to person.   If you have angina and it is treated, it may resolve. If you feel it again, the symptoms may not be the same in both type and location.  DIAGNOSIS   Emergency room evaluation or hospital admission may be needed to determine if there are any blockages of your coronary arteries.   Blood tests, EKGs, and chest X-rays may be done.   Further testing may include a stress test or an angiogram.   A heart specialist (cardiologist) may be asked to assist with your evaluation.   Other conditions that may feel like angina, but are not a problem with the heart include:   Muscle strain in the chest wall.   Blood clots in the lung.   Anxiety.   Acid reflux from the stomach.  RISKS AND COMPLICATIONS  Angina that is not treated or evaluated can lead to a decline in oxygen delivery to the heart muscle, a heart attack, or even death. TREATMENT  Depending on severity of the blockages and on other factors, angina may be treated  with:  Lifestyle changes such as weight loss, stopping smoking, appropriate exercise, or low cholesterol and low salt diet.   Medications to control or to treat the risk factors for angina.   Procedures such as angioplasty or stent placement may allow the blockages to be opened without surgery.   Open heart surgery may be needed to bypass blocked arteries that cannot be treated by other methods.  HOME CARE INSTRUCTIONS   If your caregiver prescribed medication to control your angina, take them as directed. Report side effects. Do not stop medications or adjust the dosages on your own.   Regular exercise is good for you as long as it does not cause discomfort. Avoid activities that trigger attacks of angina. Walking is the best exercise. Do not begin any new type of exercise until you check with your caregiver.  You may still have a sexual relationship if it does not cause angina. Tell your caregiver if it does.   Stop smoking. Do not use nicotine patches or gum until you check with your caregiver.   If you are overweight, you should lose weight. Eat a heart healthy diet that is low in fat and salt.   If your caregiver has given you a follow-up appointment, it is very important to keep that appointment. Not keeping the appointment could result in a heart attack, permanent heart damage, and disability. If there is any problem keeping the appointment, you must call back to this facility for assistance.  SEEK MEDICAL CARE IF:   Your angina seems to be occurring more frequently or seems to be lasting longer.   You are having problems that you think may be side effects of the medicine you are taking.  SEEK IMMEDIATE MEDICAL CARE IF:   You have severe chest discomfort, especially if the pain is crushing or pressure-like and spreads to the arms, back, neck, or jaw.   You are sweating, feel sick to your stomach (nauseous), or have shortness of breath.   You have an attack of angina that does  not get better after rest or taking your usual medicine.   You wake from sleep with chest pain.   You feel dizzy, faint, or experience extreme fatigue.   You have chest pain not typical of your usual angina. THIS IS AN EMERGENCY. Do not wait to see if the pain will go away. Get medical help at once. Call 911. DO NOT drive yourself to the hospital.  MAKE SURE YOU:   Understand these instructions.   Will watch your condition.   Will get help right away if you are not doing well or get worse.  Document Released: 09/14/2005 Document Revised: 05/27/2011 Document Reviewed: 04/17/2008 [facility] Patient Information 2012 [place], [place].

## 2011-08-06 NOTE — Plan of Care (Signed)
Problem: Phase I Progression Outcomes Goal: Aspirin unless contraindicated Outcome: Completed/Met Date Met:  08/06/11 Pt receives ASA in her Aggronox and refused to take an extra 81mg 

## 2011-08-06 NOTE — Progress Notes (Signed)
TELEMETRY: Reviewed telemetry pt in NSR: Filed Vitals:   08/05/11 2340 08/06/11 0000 08/06/11 0400 08/06/11 0500  BP:  129/67 112/66   Pulse: 66 61    Temp: 97.8 F (36.6 C) 98.1 F (36.7 C) 97.3 F (36.3 C)   TempSrc: Oral Oral Oral   Resp:      Height:      Weight:    56.9 kg (125 lb 7.1 oz)  SpO2: 99%  100%     Intake/Output Summary (Last 24 hours) at 08/06/11 0723 Last data filed at 08/06/11 0300  Gross per 24 hour  Intake     60 ml  Output    995 ml  Net   -935 ml    SUBJECTIVE Still having left forearm pain reproduced by palpation. Some relief with Tylenol. No chest pain.   LABS: Basic Metabolic Panel:  Basename 08/05/11 0710  NA 138  K 4.0  CL 105  CO2 25  GLUCOSE 149*  BUN 19  CREATININE 0.60  CALCIUM 9.9  MG --  PHOS --   Liver Function Tests: No results found for this basename: AST:2,ALT:2,ALKPHOS:2,BILITOT:2,PROT:2,ALBUMIN:2 in the last 72 hours No results found for this basename: LIPASE:2,AMYLASE:2 in the last 72 hours CBC:  Basename 08/05/11 0710  WBC 7.7  NEUTROABS 3.8  HGB 12.4  HCT 37.7  MCV 97.7  PLT 200   Cardiac Enzymes:  Basename 08/06/11 0032 08/05/11 1710 08/05/11 1044  CKTOTAL 73 77 --  CKMB 3.0 2.7 --  CKMBINDEX -- -- --  TROPONINI <0.30 <0.30 <0.30   BNP: No results found for this basename: POCBNP:3 in the last 72 hours D-Dimer: No results found for this basename: DDIMER:2 in the last 72 hours Hemoglobin A1C: No results found for this basename: HGBA1C in the last 72 hours Fasting Lipid Panel: No results found for this basename: CHOL,HDL,LDLCALC,TRIG,CHOLHDL,LDLDIRECT in the last 72 hours Thyroid Function Tests: No results found for this basename: TSH,T4TOTAL,FREET3,T3FREE,THYROIDAB in the last 72 hours Anemia Panel: No results found for this basename: VITAMINB12,FOLATE,FERRITIN,TIBC,IRON,RETICCTPCT in the last 72 hours  Radiology/Studies:  Dg Chest Portable 1 View  08/05/2011  *RADIOLOGY REPORT*  Clinical Data:  Chest pain, cough, and shortness of breath.  History of pulmonary fibrosis.  PORTABLE CHEST - 1 VIEW  Comparison: 05/04/2011  Findings: Chronic mild cardiomegaly.  Evidence of prosthetic mitral valve and CABG.  Vascularity is normal.  Chronic accentuation of the interstitial markings consistent with pulmonary fibrosis.  No effusions.  IMPRESSION: No acute abnormality.  Chronic heart and lung disease.  Original Report Authenticated By: Gwynn Burly, M.D.    PHYSICAL EXAM General: Well developed, well nourished, in no acute distress. Head: Normocephalic, atraumatic, sclera non-icteric, no xanthomas, nares are without discharge. Neck: Negative for carotid bruits. JVD not elevated. Lungs: Diffuse bilateral crackles. Breathing is unlabored. Heart: RRR S1 S2 without murmurs, rubs, or gallops.  Abdomen: Soft, non-tender, non-distended with normoactive bowel sounds. No hepatomegaly. No rebound/guarding. No obvious abdominal masses. Msk:  Strength and tone appears normal for age. Extremities: No clubbing, cyanosis or edema.  Distal pedal pulses are 2+ and equal bilaterally. Localized pain to palpation left forearm. No erythema. Neuro: Alert and oriented X 3. Moves all extremities spontaneously. Psych:  Responds to questions appropriately with a normal affect.  ASSESSMENT AND PLAN:  Principal Problem:  *Unstable angina Active Problems:  DIABETES MELLITUS, TYPE II  HYPERLIPIDEMIA  HYPERTENSION  CORONARY HEART DISEASE  PULMONARY FIBROSIS, POSTINFLAMMATORY  Rheumatoid arthritis   Plan: Atypical left forearm pain is clearly  musculoskeletal. CE negative x2. Ecg pending. Will discharge home today. Schedule follow up lexiscan myoview as outpt. F/U with Dr. Jens Som in [facility] office.  Signed, Alizza Sacra [place] MD

## 2011-08-06 NOTE — Discharge Summary (Signed)
Physician Discharge Summary  Patient ID: Briana Ray,  MRN: [id number], DOB/AGE: 1924/07/27 77 y.o.  Admit date: 08/05/2011 Discharge date: 08/06/2011  Primary Discharge Diagnosis:  1. Chest pain, felt musculoskeletal  A. Ruled out for MI this admission, for outpatient Lexiscan myoview 2. CAD  A. s/p CABG x 4 in 1998  B. Inferior STEMI in 2005 - s/p Taxus DES to VG-PDA  C. History of valve repair  Secondary Discharge Diagnosis:  1. CVA (cerebral infarction)  04/25/10 (L parietal lobe infarction) 2.  Hyperlipidemia     3.  Diabetes mellitus, type 2     4.  GERD (gastroesophageal reflux disease)     5.  Pulmonary fibrosis, postinflammatory due to RA 6  Rheumatoid arthritis, on Arava, dx for 20 yrs   7. 3.8 x 3.5 cm Abdominal aortic aneurysm by CT March 2012       Hospital Course: Briana Ray is an 77 year old female with a history of coronary artery disease, diabetes, rheumatoid arthritis, and GERD who presented with chest pain. Around 10 PM on the night prior to admission, she began to note intermittent fleeting severe chest/left arm pain. She had several cycles of this all night and could never really get comfortable to sleep. Because her symptoms, she came into the ER for evaluation. Her symptoms were reproducible with palpation and were felt to be different than her prior angina. EKG was not acute and initial cardiac enzymes were negative. She was admitted to the hospital for evaluation overnight and observed given the initial severity in nature of her symptoms. Cardiac enzymes remained negative and the patient is feeling better without further chest pain. Dr. [place] has seen and examined her today and feels she is stable for discharge with plans for an outpatient Lexiscan Myoview.  Discharge Vitals: Blood pressure 119/72, pulse 61, temperature 98.2 F (36.8 C), temperature source Oral, resp. rate 18, height 4\' 11"  (1.499 m), weight 125 lb 7.1 oz (56.9 kg), SpO2 98.00%.    Labs:     Lab Results  Component Value Date   WBC 7.7 08/05/2011   HGB 12.4 08/05/2011   HCT 37.7 08/05/2011   MCV 97.7 08/05/2011   PLT 200 08/05/2011    Lab 08/05/11 0710  NA 138  K 4.0  CL 105  CO2 25  BUN 19  CREATININE 0.60  CALCIUM 9.9  PROT --  BILITOT --  ALKPHOS --  ALT --  AST --  GLUCOSE 149*   Lab Results  Component Value Date   CKTOTAL 70 08/06/2011   CKMB 2.9 08/06/2011   TROPONINI <0.30 08/06/2011     Diagnostic Studies/Procedures:  1. CXR 08/05/11 - IMPRESSION: No acute abnormality.  Chronic heart and lung disease.     Discharge Medications:  Current Discharge Medication List    CONTINUE these medications which have NOT CHANGED   Details  budesonide-formoterol (SYMBICORT) 160-4.5 MCG/ACT inhaler Inhale 2 puffs into the lungs 2 (two) times daily.     capsaicin (ZOSTRIX) 0.025 % cream Apply topically 2 (two) times daily as needed. As needed for leg pain     dipyridamole-aspirin (AGGRENOX) 25-200 MG per 12 hr capsule Take 1 capsule by mouth 2 (two) times daily.     glipiZIDE (GLUCOTROL XL) 2.5 MG 24 hr tablet Take 2.5 mg by mouth daily.      InFLIXimab (REMICADE IV) Inject into the vein. IV every 6 wks     leflunomide (ARAVA) 20 MG tablet Take 10 mg by mouth daily.  loperamide (IMODIUM A-D) 2 MG tablet Take 2 mg by mouth as needed. For diaherra    metoprolol (TOPROL-XL) 50 MG 24 hr tablet Take 50 mg by mouth 2 (two) times daily.      Multiple Vitamins-Minerals (CENTRUM SILVER PO) Take 1 tablet by mouth daily.     nitroGLYCERIN (NITROSTAT) 0.4 MG SL tablet Place 0.4 mg under the tongue every 5 (five) minutes as needed. For chest pain     NON FORMULARY Oxygen 3 liters during the day  and 2 liters at bedtime  Does not use while at home    Omega-3 Fatty Acids (FISH OIL) 1200 MG CAPS Take 1 capsule by mouth daily.     levothyroxine (SYNTHROID, LEVOTHROID) 50 MCG tablet Take 50 mcg by mouth daily.     rosuvastatin (CRESTOR) 40 MG tablet Take 1 tablet (40 mg  total) by mouth daily. Qty: 30 tablet, Refills: 11      Please note on admission the patient's home medication reconciliation had two errors.  The patient clarified that she is taking Crestor 40mg  and Toprol XL 50mg  bid. I also tried to change "litters" to "liters" in her home O2 but the computer would not give me this option.    Disposition:  The patient will be discharged in stable condition to home.  Future Orders Please Complete By Expires   Diet - low sodium heart healthy      Comments:   Diabetic diet   Increase activity slowly        Follow-up Information    Follow up with [facility]. (Your stress test is scheduled for 08/11/11 at 11:45am . Nothing to eat or drink after midnight the night before.)    Contact information:   [address] [place] [address] [phone]      Follow up with Olga Millers, MD. (You will see Tereso Newcomer, PA-C for your follow-up appointment 08/24/11 at  8:30am.)    Contact information:   [address], [address] [place] [address] [phone]          Duration of Discharge Encounter: Greater than 30 minutes including physician and PA time.  Signed, Ronie Spies PA-C 08/06/2011, 1:46 PM

## 2011-08-07 NOTE — Discharge Summary (Signed)
Patient seen and examined and history reviewed. Agree with above findings and plan.  Theron Arista JordanMD 08/07/2011 7:24 AM

## 2011-08-10 ENCOUNTER — Ambulatory Visit (HOSPITAL_COMMUNITY): Payer: Medicare Other | Attending: Cardiology | Admitting: Radiology

## 2011-08-10 ENCOUNTER — Other Ambulatory Visit (HOSPITAL_COMMUNITY): Payer: Self-pay | Admitting: Cardiology

## 2011-08-10 VITALS — Ht 59.0 in | Wt 123.0 lb

## 2011-08-10 DIAGNOSIS — E785 Hyperlipidemia, unspecified: Secondary | ICD-10-CM | POA: Insufficient documentation

## 2011-08-10 DIAGNOSIS — Z951 Presence of aortocoronary bypass graft: Secondary | ICD-10-CM | POA: Insufficient documentation

## 2011-08-10 DIAGNOSIS — Z9889 Other specified postprocedural states: Secondary | ICD-10-CM | POA: Insufficient documentation

## 2011-08-10 DIAGNOSIS — E119 Type 2 diabetes mellitus without complications: Secondary | ICD-10-CM | POA: Insufficient documentation

## 2011-08-10 DIAGNOSIS — I10 Essential (primary) hypertension: Secondary | ICD-10-CM | POA: Insufficient documentation

## 2011-08-10 DIAGNOSIS — I440 Atrioventricular block, first degree: Secondary | ICD-10-CM | POA: Insufficient documentation

## 2011-08-10 DIAGNOSIS — R0602 Shortness of breath: Secondary | ICD-10-CM

## 2011-08-10 DIAGNOSIS — R079 Chest pain, unspecified: Secondary | ICD-10-CM

## 2011-08-10 DIAGNOSIS — R5383 Other fatigue: Secondary | ICD-10-CM | POA: Insufficient documentation

## 2011-08-10 DIAGNOSIS — R5381 Other malaise: Secondary | ICD-10-CM | POA: Insufficient documentation

## 2011-08-10 DIAGNOSIS — R0609 Other forms of dyspnea: Secondary | ICD-10-CM | POA: Insufficient documentation

## 2011-08-10 DIAGNOSIS — I2510 Atherosclerotic heart disease of native coronary artery without angina pectoris: Secondary | ICD-10-CM

## 2011-08-10 DIAGNOSIS — Z8249 Family history of ischemic heart disease and other diseases of the circulatory system: Secondary | ICD-10-CM | POA: Insufficient documentation

## 2011-08-10 DIAGNOSIS — R0989 Other specified symptoms and signs involving the circulatory and respiratory systems: Secondary | ICD-10-CM | POA: Insufficient documentation

## 2011-08-10 MED ORDER — TECHNETIUM TC 99M TETROFOSMIN IV KIT
33.0000 | PACK | Freq: Once | INTRAVENOUS | Status: AC | PRN
  Administered 2011-08-10: 33 via INTRAVENOUS

## 2011-08-10 MED ORDER — REGADENOSON 0.4 MG/5ML IV SOLN
0.4000 mg | Freq: Once | INTRAVENOUS | Status: AC
  Administered 2011-08-10: 0.4 mg via INTRAVENOUS

## 2011-08-10 MED ORDER — TECHNETIUM TC 99M TETROFOSMIN IV KIT
11.0000 | PACK | Freq: Once | INTRAVENOUS | Status: AC | PRN
  Administered 2011-08-10: 11 via INTRAVENOUS

## 2011-08-10 NOTE — Progress Notes (Signed)
[facility] SITE 3 NUCLEAR MED [address] [place] [address] [phone]  Cardiology Nuclear Med Study  Briana Ray is a 77 y.o. female [id number] 04-05-1924   Nuclear Med Background Indication for Stress Test:  Evaluation for Ischemia, Graft Patency, Stent Patency and 08/05/11 Post Hospital and  Abnormal EKG with Chest Pain and negative enzymes History: 98 CABG x 3 and Mitral Valve Repair,11 Echo:EF=55-60% and moderate LVH and Mild Aortic Stenosis,05 Heart Catheterization:Occluded Vein Grafts to Interm and Marginal,05 Myocardial Infarction: Inferior STEMI,07 Myocardial Perfusion Study:EF=68% and no ischemia,05 Stents: SVG-PDA and First degree AVB and RV conduction delay Cardiac Risk Factors:11 CVA, Family History - CAD, Hypertension, Lipids and NIDDM  Symptoms:  DOE, Fatigue and Left arm and neck pain   Nuclear Pre-Procedure Caffeine/Decaff Intake:  8:00am NPO After: 7:30am   Lungs:  clear IV 0.9% NS with Angio Cath:  22g  IV Site: R Forearm  IV Started by:  Stanton Kidney, EMT-P  Chest Size (in):  34 Cup Size: C  Height: 4\' 11"  (1.499 m)  Weight:  123 lb (55.792 kg)  BMI:  Body mass index is 24.84 kg/(m^2). Tech Comments:  Aggrenox Held > 72 hours and  metoprolol held, per patient.    Nuclear Med Study 1 or 2 day study: 1 day  Stress Test Type:  Lexiscan  Reading MD: Willa Rough, MD  Order Authorizing Provider:  Ripley Fraise  Resting Radionuclide: Technetium 26m Tetrofosmin  Resting Radionuclide Dose: 11.0 mCi   Stress Radionuclide:  Technetium 71m Tetrofosmin  Stress Radionuclide Dose: 33.0 mCi           Stress Protocol Rest HR: 65 Stress HR: 90  Rest BP: 147/86 Stress BP: 144/87  Exercise Time (min): n/a METS: n/a   Predicted Max HR: 136 bpm % Max HR: 66.18 bpm Rate Pressure Product: 72536   Dose of Adenosine (mg):  n/a Dose of Lexiscan: 0.4 mg  Dose of Atropine (mg): n/a Dose of Dobutamine: n/a mcg/kg/min (at max HR)  Stress  Test Technologist: Cathlyn Parsons, RN  Nuclear Technologist:  Domenic Polite, CNMT     Rest Procedure:  Myocardial perfusion imaging was performed at rest 45 minutes following the intravenous administration of Technetium 64m Tetrofosmin. Rest ECG: NSR  Stress Procedure:  The patient received IV Lexiscan 0.4 mg over 15-seconds.  Technetium 7m Tetrofosmin injected at 30-seconds.  There were no significant changes with Lexiscan. Patient had occasional PVC's and couplet. Quantitative spect images were obtained after a 45 minute delay. Stress ECG: No significant change from baseline ECG  QPS Raw Data Images:  Normal; no motion artifact; normal heart/lung ratio. Stress Images:  Mild decreased activity in the apical lateral segment Rest Images:  Same as stress Subtraction (SDS):  No evidence of ischemia. Transient Ischemic Dilatation (Normal <1.22):  1.07 Lung/Heart Ratio (Normal <0.45):  0.44  Quantitative Gated Spect Images QGS EDV:  59 ml QGS ESV:  26 ml QGS cine images:  Mild septal dyssynergy. QGS EF: 55%  Impression Exercise Capacity:  Lexiscan with no exercise. BP Response:  Normal blood pressure response. Clinical Symptoms:  dizziness ECG Impression:  No significant ST segment change suggestive of ischemia. Comparison with Prior Nuclear Study: No images to compare  Overall Impression:  There is mild scar in the apical lateral segment. There is no ischemia.  Willa Rough

## 2011-08-11 ENCOUNTER — Other Ambulatory Visit (HOSPITAL_COMMUNITY): Payer: Medicare Other | Admitting: Radiology

## 2011-08-24 ENCOUNTER — Encounter: Payer: Medicare Other | Admitting: Physician Assistant

## 2011-09-01 ENCOUNTER — Encounter: Payer: Self-pay | Admitting: Cardiology

## 2011-09-02 ENCOUNTER — Encounter: Payer: Self-pay | Admitting: Cardiology

## 2011-09-02 ENCOUNTER — Other Ambulatory Visit: Payer: Self-pay | Admitting: Cardiology

## 2011-09-02 ENCOUNTER — Ambulatory Visit (INDEPENDENT_AMBULATORY_CARE_PROVIDER_SITE_OTHER): Payer: Medicare Other | Admitting: Cardiology

## 2011-09-02 DIAGNOSIS — I714 Abdominal aortic aneurysm, without rupture, unspecified: Secondary | ICD-10-CM

## 2011-09-02 DIAGNOSIS — I10 Essential (primary) hypertension: Secondary | ICD-10-CM

## 2011-09-02 DIAGNOSIS — E785 Hyperlipidemia, unspecified: Secondary | ICD-10-CM

## 2011-09-02 DIAGNOSIS — I2510 Atherosclerotic heart disease of native coronary artery without angina pectoris: Secondary | ICD-10-CM

## 2011-09-02 NOTE — Progress Notes (Signed)
EAV:[id number] female for fu of coronary artery disease. Status post coronary artery bypass graft and valve repair in 1998. Patient had a LIMA to the LAD, saphenous vein graft to PDA, saphenous vein graft to the intermediate and saphenous vein graft to the marginal. Last cardiac catheterization was performed in 2005 and revealed severe three-vessel obstructive coronary artery disease. Patent saphenous vein graft to the posterior descending artery but with high-grade stenosis at the anastomosis site. Patent left internal mammary artery graft to the left anterior descending. Two occluded vein grafts presumably to intermediate and marginal vessels. Mild left ventricular dysfunction. Successful stenting of the saphenous vein graft to the posterior descending artery at the distal anastomosis. Note abdominal CT in March of 2012 showed a 3.8 x 3.5 cm abdominal aortic aneurysm. There is also moderate to marked narrowing of the left iliac. Patient admitted in November of 2012 with chest pain felt to be musculoskeletal. Enzymes negative. Outpatient Myoview in November of 2012 revealed mild scar in the apical lateral segment but no ischemia. Ejection fraction was 55%. Since then, she does have dyspnea on exertion which is unchanged and felt secondary to pulmonary fibrosis. There is no orthopnea, PND, pedal edema or exertional chest pain.   Current Outpatient Prescriptions  Medication Sig Dispense Refill  . Abatacept (ORENCIA IV) Inject into the vein. Every 4 weeks       . budesonide-formoterol (SYMBICORT) 160-4.5 MCG/ACT inhaler Inhale 2 puffs into the lungs 2 (two) times daily.       Marland Kitchen dipyridamole-aspirin (AGGRENOX) 25-200 MG per 12 hr capsule Take 1 capsule by mouth 2 (two) times daily.       Marland Kitchen glipiZIDE (GLUCOTROL XL) 2.5 MG 24 hr tablet Take 2.5 mg by mouth daily.        Marland Kitchen leflunomide (ARAVA) 20 MG tablet Take 10 mg by mouth daily.       Marland Kitchen levothyroxine (SYNTHROID, LEVOTHROID) 50 MCG tablet Take 50 mcg by mouth  daily.       Marland Kitchen loperamide (IMODIUM A-D) 2 MG tablet Take 2 mg by mouth as needed. For diaherra      . metoprolol (TOPROL-XL) 50 MG 24 hr tablet Take 50 mg by mouth 2 (two) times daily.        . Multiple Vitamins-Minerals (CENTRUM SILVER PO) Take 1 tablet by mouth daily.       . nitroGLYCERIN (NITROSTAT) 0.4 MG SL tablet Place 0.4 mg under the tongue every 5 (five) minutes as needed. For chest pain       . NON FORMULARY Oxygen 3 litters during the day  and 2 litters at bedtime  Does not use while at home      . Omega-3 Fatty Acids (FISH OIL) 1200 MG CAPS Take 1 capsule by mouth daily.       . rosuvastatin (CRESTOR) 40 MG tablet Take 1 tablet (40 mg total) by mouth daily.  30 tablet  11  . capsaicin (ZOSTRIX) 0.025 % cream Apply topically 2 (two) times daily as needed. As needed for leg pain       . InFLIXimab (REMICADE IV) Inject into the vein. IV every 6 wks          Past Medical History  Diagnosis Date  . Coronary heart disease   . CVA (cerebral infarction) 04/25/10    MRI L parietal lobe infarction   . Hyperlipidemia   . Diabetes mellitus, type 2   . GERD (gastroesophageal reflux disease)   . 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 Rheumatoid arthritis     on Arava. Dx for 20 yrs  . Aneurysm   . Myocardial infarction     Past Surgical History  Procedure Date  . Breast reduction surgery   . Cholecystectomy   . Appendectomy   . Coronary artery bypass graft 1998    valve repair  . Cardiac stents   . Umbilical hernia repair   . Tonsillectomy   . Vesicovaginal fistula closure w/ tah   . Carpal tunnel release   . Abdominal hysterectomy     History   Social History  . Marital Status: Widowed    Spouse Name: N/A    Number of Children: N/A  . Years of Education: N/A   Occupational History  . Retired     [employer]   Social History Main Topics  . Smoking status: Never Smoker   . Smokeless tobacco: Never Used  . Alcohol Use: No       rare  . Drug Use: Not on file  . Sexually Active: Not on file   Other Topics Concern  . Not on file   Social History Narrative  . No narrative on file    ROS: no fevers or chills, productive cough, hemoptysis, dysphasia, odynophagia, melena, hematochezia, dysuria, hematuria, rash, seizure activity, orthopnea, PND, pedal edema, claudication. Remaining systems are negative.  Physical Exam: Well-developed well-nourished in no acute distress.  Skin is warm and dry.  HEENT is normal.  Neck is supple. No thyromegaly.  Chest is dry crackles at the bases Cardiovascular exam is regular rate and rhythm. 2/6 systolic murmur left sternal border Abdominal exam nontender or distended. No masses palpated. Extremities show no edema. neuro grossly intact

## 2011-09-02 NOTE — Patient Instructions (Signed)
Your physician wants you to follow-up in: 6 MONTHS You will receive a reminder letter in the mail two months in advance. If you don't receive a letter, please call our office to schedule the follow-up appointment. 

## 2011-09-02 NOTE — Assessment & Plan Note (Signed)
Blood pressure controlled with present medications. 

## 2011-09-02 NOTE — Assessment & Plan Note (Signed)
Plan followup ultrasound in March of 2013.

## 2011-09-02 NOTE — Assessment & Plan Note (Signed)
Continue aspirin and statin. Recent Myoview negative. 

## 2011-09-02 NOTE — Assessment & Plan Note (Signed)
Recent increase in Crestor or240 mg daily. Repeat lipids and liver today.

## 2011-09-03 LAB — LIPID PANEL
Cholesterol: 135 mg/dL (ref 0–200)
LDL Cholesterol: 61 mg/dL (ref 0–99)
VLDL: 37 mg/dL (ref 0–40)

## 2011-09-03 LAB — HEPATIC FUNCTION PANEL
Bilirubin, Direct: 0.1 mg/dL (ref 0.0–0.3)
Indirect Bilirubin: 0.4 mg/dL (ref 0.0–0.9)

## 2011-09-07 ENCOUNTER — Encounter: Payer: Self-pay | Admitting: Critical Care Medicine

## 2011-09-07 ENCOUNTER — Ambulatory Visit (INDEPENDENT_AMBULATORY_CARE_PROVIDER_SITE_OTHER): Payer: Medicare Other | Admitting: Critical Care Medicine

## 2011-09-07 DIAGNOSIS — J8410 Pulmonary fibrosis, unspecified: Secondary | ICD-10-CM

## 2011-09-07 NOTE — Patient Instructions (Signed)
You may be off oxygen during the day and with exertion,  Still wear oxygen at night No other medication changes Return 4 months

## 2011-09-07 NOTE — Progress Notes (Signed)
Subjective:    Patient ID: Briana Ray, female    DOB: [birth year], 77 y.o.   MRN: [id number]  HPI  77 y.o.   WF with interstital changes on CT chest in setting of RA. Has RA induced interstitial lung disease.  Past Hx pertinent for severe Rheumatoid arthritis Dx 66yrs ago. Rx with Leflunamide. Not on methotrexate.   05/04/2011 Since 1/12 much worse.  On ABX and severe cough that wont go away. Cough is dry, occ prod, mucus is clear.  Notes more dyspnea esp with deep breath.  No edema in feet.  No real heartburn.   >>steroid taper and o2 started 2 l/ rest, 3l/ act   8/21 Follow up  Pt returns with family for follow up . She returns feeling bettter with less dyspnea and cough . No fever or discolored mucus  Recent divertic flare that is resolving with left of gas /bloating . Being tx by Dr. Tenny Craw.  Tolerating O2 started last ov except for nasal dryness. We discussed saline spray and gel.  Sats on room air today in office ~94%. Walking 90-92% -unable to walk far without giving out joint issues (no cane) and dyspnea.   9/21 Cough is better, dyspnea is better.  Had diverticulitis over this time.  Now on remecade.  Seems better after remecade.  Off 12 weeks off remecade Now ant abd hurts .     09/07/2011 Pt was in hosp about 4 weeks ago.  Had L arm pain but then this resolved and AMI r/o.  Had RA flare.  Cough is less, dyspnea is the same.  Notes sl amount of mucus espec in AM. On oxygen as before.    Past Medical History  Diagnosis Date  . Coronary heart disease   . CVA (cerebral infarction) 04/25/10    MRI L parietal lobe infarction   . Hyperlipidemia   . Diabetes mellitus, type 2   . GERD (gastroesophageal reflux disease)   . 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 Rheumatoid arthritis     on Arava. Dx for 20 yrs  . Aneurysm   . Myocardial infarction      Family History  Problem Relation Age of Onset  . Heart disease Mother   . Heart  disease Brother     x 5  . Heart disease Sister     x 5  . Heart disease Father      History   Social History  . Marital Status: Widowed    Spouse Name: N/A    Number of Children: N/A  . Years of Education: N/A   Occupational History  . Retired     [employer]   Social History Main Topics  . Smoking status: Never Smoker   . Smokeless tobacco: Never Used  . Alcohol Use: No     rare  . Drug Use: Not on file  . Sexually Active: Not on file   Other Topics Concern  . Not on file   Social History Narrative  . No narrative on file     Allergies  Allergen Reactions  . HKV:[id number] Acid+Aspartame     REACTION: diarrhea     Outpatient Prescriptions Prior to Visit  Medication Sig Dispense Refill  . Abatacept (ORENCIA IV) Inject into the vein. Every 4 weeks       . budesonide-formoterol (SYMBICORT) 160-4.5 MCG/ACT inhaler Inhale 2 puffs into the lungs 2 (two) times daily.       Marland Kitchen  dipyridamole-aspirin (AGGRENOX) 25-200 MG per 12 hr capsule Take 1 capsule by mouth 2 (two) times daily.       Marland Kitchen glipiZIDE (GLUCOTROL XL) 2.5 MG 24 hr tablet Take 2.5 mg by mouth daily.        Marland Kitchen leflunomide (ARAVA) 20 MG tablet Take 10 mg by mouth daily.       Marland Kitchen levothyroxine (SYNTHROID, LEVOTHROID) 50 MCG tablet Take 50 mcg by mouth daily.       Marland Kitchen loperamide (IMODIUM A-D) 2 MG tablet Take 2 mg by mouth as needed. For diaherra      . metoprolol (TOPROL-XL) 50 MG 24 hr tablet Take 50 mg by mouth 2 (two) times daily.        . Multiple Vitamins-Minerals (CENTRUM SILVER PO) Take 1 tablet by mouth daily.       . nitroGLYCERIN (NITROSTAT) 0.4 MG SL tablet Place 0.4 mg under the tongue every 5 (five) minutes as needed. For chest pain       . Omega-3 Fatty Acids (FISH OIL) 1200 MG CAPS Take 1 capsule by mouth daily.       . rosuvastatin (CRESTOR) 40 MG tablet Take 1 tablet (40 mg total) by mouth daily.  30 tablet  11  . NON FORMULARY Oxygen 3 litters during the day  and 2 litters  at bedtime  Does not use while at home      . capsaicin (ZOSTRIX) 0.025 % cream Apply topically 2 (two) times daily as needed. As needed for leg pain       . InFLIXimab (REMICADE IV) Inject into the vein. IV every 6 wks          Review of Systems  Constitutional:   No  weight loss, night sweats,  Fevers, chills, fatigue, lassitude.  HEENT:   No headaches,  Difficulty swallowing,  Tooth/dental problems,  Sore throat,                No sneezing, itching, ear ache, nasal congestion, post nasal drip,   CV:  No chest pain,  Orthopnea, PND, swelling in lower extremities, anasarca, dizziness, palpitations  GI  No heartburn, indigestion,  vomiting, diarrhea,   loss of appetite  Resp: Notes  shortness of breath with exertion not  at rest.  No excess mucus, no  productive cough,  Notes  non-productive cough,  No coughing up of blood.  No change in color of mucus.  No wheezing.  No chest wall deformity  Skin: no rash or lesions.  GU: no dysuria, change in color of urine, no urgency or frequency.  No flank pain.  MS:  +chronic  joint pain     Psych:  No change in mood or affect. No depression or anxiety.  No memory loss.     Objective:   Physical Exam  Filed Vitals:   09/07/11 1001  BP: 118/82  Pulse: 61  Temp: 97.9 F (36.6 C)  TempSrc: Oral  Height: 4\' 9"  (1.448 m)  Weight: 125 lb (56.7 kg)  SpO2: 99%    Gen: Pleasant, well-nourished, in no distress,  normal affect  ENT: No lesions,  mouth clear,  oropharynx clear, no postnasal drip  Neck: No JVD, no TMG, no carotid bruits  Lungs: No use of accessory muscles, no dullness to percussion, dry rales, poor airflow  Cardiovascular: RRR, heart sounds normal, no murmur or gallops, no peripheral edema  Abdomen: soft and NT, no HSM,  BS normal  Musculoskeletal: No deformities, no cyanosis or  clubbing  Neuro: alert, non focal  Skin: Warm, no lesions or rashes        Assessment & Plan:    PULMONARY FIBROSIS,  POSTINFLAMMATORY Pulmonary fibrosis on the basis of rheumatoid arthritis now in improved on [place] Note oxygenation is improved Plan Reduce oxygen to nighttime only Continue arava Continued Symbicort for bronchiolitis obliterans      Updated Medication List Outpatient Encounter Prescriptions as of 09/07/2011  Medication Sig Dispense Refill  . Abatacept (ORENCIA IV) Inject into the vein. Every 4 weeks       . budesonide-formoterol (SYMBICORT) 160-4.5 MCG/ACT inhaler Inhale 2 puffs into the lungs 2 (two) times daily.       Marland Kitchen dipyridamole-aspirin (AGGRENOX) 25-200 MG per 12 hr capsule Take 1 capsule by mouth 2 (two) times daily.       Marland Kitchen glipiZIDE (GLUCOTROL XL) 2.5 MG 24 hr tablet Take 2.5 mg by mouth daily.        Marland Kitchen leflunomide (ARAVA) 20 MG tablet Take 10 mg by mouth daily.       Marland Kitchen levothyroxine (SYNTHROID, LEVOTHROID) 50 MCG tablet Take 50 mcg by mouth daily.       Marland Kitchen loperamide (IMODIUM A-D) 2 MG tablet Take 2 mg by mouth as needed. For diaherra      . metoprolol (TOPROL-XL) 50 MG 24 hr tablet Take 50 mg by mouth 2 (two) times daily.        . Multiple Vitamins-Minerals (CENTRUM SILVER PO) Take 1 tablet by mouth daily.       . nitroGLYCERIN (NITROSTAT) 0.4 MG SL tablet Place 0.4 mg under the tongue every 5 (five) minutes as needed. For chest pain       . Omega-3 Fatty Acids (FISH OIL) 1200 MG CAPS Take 1 capsule by mouth daily.       . rosuvastatin (CRESTOR) 40 MG tablet Take 1 tablet (40 mg total) by mouth daily.  30 tablet  11  . DISCONTD: NON FORMULARY Oxygen 3 litters during the day  and 2 litters at bedtime  Does not use while at home      . DISCONTD: capsaicin (ZOSTRIX) 0.025 % cream Apply topically 2 (two) times daily as needed. As needed for leg pain       . DISCONTD: InFLIXimab (REMICADE IV) Inject into the vein. IV every 6 wks

## 2011-09-07 NOTE — Assessment & Plan Note (Signed)
Pulmonary fibrosis on the basis of rheumatoid arthritis now in improved on [place] Note oxygenation is improved Plan Reduce oxygen to nighttime only Continue arava Continued Symbicort for bronchiolitis obliterans

## 2011-09-08 ENCOUNTER — Encounter: Payer: Self-pay | Admitting: *Deleted

## 2011-09-09 ENCOUNTER — Telehealth: Payer: Self-pay | Admitting: Critical Care Medicine

## 2011-09-09 DIAGNOSIS — J8410 Pulmonary fibrosis, unspecified: Secondary | ICD-10-CM

## 2011-09-09 NOTE — Telephone Encounter (Signed)
Order sent to [facility] to take away daytime o2 since PW told the pt she only needs noct o2 now.

## 2011-10-12 ENCOUNTER — Other Ambulatory Visit: Payer: Self-pay | Admitting: Critical Care Medicine

## 2011-11-16 ENCOUNTER — Encounter (HOSPITAL_BASED_OUTPATIENT_CLINIC_OR_DEPARTMENT_OTHER): Payer: Self-pay | Admitting: *Deleted

## 2011-11-16 ENCOUNTER — Inpatient Hospital Stay (HOSPITAL_BASED_OUTPATIENT_CLINIC_OR_DEPARTMENT_OTHER)
Admission: EM | Admit: 2011-11-16 | Discharge: 2011-11-20 | DRG: 194 | Disposition: A | Discharge status: Home Health | Payer: Medicare Other | Attending: Internal Medicine | Admitting: Internal Medicine

## 2011-11-16 ENCOUNTER — Emergency Department (INDEPENDENT_AMBULATORY_CARE_PROVIDER_SITE_OTHER): Payer: Medicare Other

## 2011-11-16 ENCOUNTER — Other Ambulatory Visit: Payer: Self-pay

## 2011-11-16 DIAGNOSIS — I517 Cardiomegaly: Secondary | ICD-10-CM

## 2011-11-16 DIAGNOSIS — T502X5A Adverse effect of carbonic-anhydrase inhibitors, benzothiadiazides and other diuretics, initial encounter: Secondary | ICD-10-CM | POA: Diagnosis present

## 2011-11-16 DIAGNOSIS — R0602 Shortness of breath: Secondary | ICD-10-CM

## 2011-11-16 DIAGNOSIS — K219 Gastro-esophageal reflux disease without esophagitis: Secondary | ICD-10-CM | POA: Diagnosis present

## 2011-11-16 DIAGNOSIS — I10 Essential (primary) hypertension: Secondary | ICD-10-CM | POA: Diagnosis present

## 2011-11-16 DIAGNOSIS — I714 Abdominal aortic aneurysm, without rupture, unspecified: Secondary | ICD-10-CM | POA: Diagnosis present

## 2011-11-16 DIAGNOSIS — J189 Pneumonia, unspecified organism: Principal | ICD-10-CM | POA: Diagnosis present

## 2011-11-16 DIAGNOSIS — M069 Rheumatoid arthritis, unspecified: Secondary | ICD-10-CM | POA: Diagnosis present

## 2011-11-16 DIAGNOSIS — E119 Type 2 diabetes mellitus without complications: Secondary | ICD-10-CM | POA: Diagnosis present

## 2011-11-16 DIAGNOSIS — R05 Cough: Secondary | ICD-10-CM

## 2011-11-16 DIAGNOSIS — E785 Hyperlipidemia, unspecified: Secondary | ICD-10-CM

## 2011-11-16 DIAGNOSIS — I252 Old myocardial infarction: Secondary | ICD-10-CM

## 2011-11-16 DIAGNOSIS — Z888 Allergy status to other drugs, medicaments and biological substances status: Secondary | ICD-10-CM

## 2011-11-16 DIAGNOSIS — I6789 Other cerebrovascular disease: Secondary | ICD-10-CM

## 2011-11-16 DIAGNOSIS — I2510 Atherosclerotic heart disease of native coronary artery without angina pectoris: Secondary | ICD-10-CM | POA: Diagnosis present

## 2011-11-16 DIAGNOSIS — J8410 Pulmonary fibrosis, unspecified: Secondary | ICD-10-CM | POA: Diagnosis present

## 2011-11-16 DIAGNOSIS — I200 Unstable angina: Secondary | ICD-10-CM

## 2011-11-16 DIAGNOSIS — D72829 Elevated white blood cell count, unspecified: Secondary | ICD-10-CM | POA: Diagnosis present

## 2011-11-16 DIAGNOSIS — R918 Other nonspecific abnormal finding of lung field: Secondary | ICD-10-CM

## 2011-11-16 DIAGNOSIS — Z951 Presence of aortocoronary bypass graft: Secondary | ICD-10-CM

## 2011-11-16 DIAGNOSIS — N179 Acute kidney failure, unspecified: Secondary | ICD-10-CM | POA: Diagnosis present

## 2011-11-16 DIAGNOSIS — Z79899 Other long term (current) drug therapy: Secondary | ICD-10-CM

## 2011-11-16 DIAGNOSIS — R071 Chest pain on breathing: Secondary | ICD-10-CM | POA: Diagnosis present

## 2011-11-16 DIAGNOSIS — M129 Arthropathy, unspecified: Secondary | ICD-10-CM

## 2011-11-16 DIAGNOSIS — I219 Acute myocardial infarction, unspecified: Secondary | ICD-10-CM

## 2011-11-16 DIAGNOSIS — Z8673 Personal history of transient ischemic attack (TIA), and cerebral infarction without residual deficits: Secondary | ICD-10-CM

## 2011-11-16 DIAGNOSIS — Z9861 Coronary angioplasty status: Secondary | ICD-10-CM

## 2011-11-16 HISTORY — DX: Encounter for other specified aftercare: Z51.89

## 2011-11-16 HISTORY — DX: Cerebral infarction, unspecified: I63.9

## 2011-11-16 HISTORY — DX: Anemia, unspecified: D64.9

## 2011-11-16 HISTORY — DX: Pneumonia, unspecified organism: J18.9

## 2011-11-16 HISTORY — DX: Reserved for inherently not codable concepts without codable children: IMO0001

## 2011-11-16 LAB — DIFFERENTIAL
Basophils Absolute: 0 10*3/uL (ref 0.0–0.1)
Basophils Relative: 0 % (ref 0–1)
Eosinophils Absolute: 0 10*3/uL (ref 0.0–0.7)
Monocytes Absolute: 1.4 10*3/uL — ABNORMAL HIGH (ref 0.1–1.0)
Monocytes Relative: 13 % — ABNORMAL HIGH (ref 3–12)
Neutro Abs: 7.9 10*3/uL — ABNORMAL HIGH (ref 1.7–7.7)
Neutrophils Relative %: 71 % (ref 43–77)

## 2011-11-16 LAB — CBC
HCT: 36.6 % (ref 36.0–46.0)
Hemoglobin: 12.2 g/dL (ref 12.0–15.0)
MCH: 31.7 pg (ref 26.0–34.0)
MCHC: 33.3 g/dL (ref 30.0–36.0)
MCHC: 33.4 g/dL (ref 30.0–36.0)
MCV: 95.8 fL (ref 78.0–100.0)
RDW: 14.8 % (ref 11.5–15.5)
RDW: 15 % (ref 11.5–15.5)

## 2011-11-16 LAB — COMPREHENSIVE METABOLIC PANEL
Albumin: 3.1 g/dL — ABNORMAL LOW (ref 3.5–5.2)
BUN: 14 mg/dL (ref 6–23)
Calcium: 9.6 mg/dL (ref 8.4–10.5)
GFR calc Af Amer: 66 mL/min — ABNORMAL LOW (ref >90)
Glucose, Bld: 128 mg/dL — ABNORMAL HIGH (ref 70–99)
Potassium: 4.1 mEq/L (ref 3.5–5.1)
Total Protein: 7.2 g/dL (ref 6.0–8.3)

## 2011-11-16 LAB — URINALYSIS, ROUTINE W REFLEX MICROSCOPIC
Bilirubin Urine: NEGATIVE
Nitrite: NEGATIVE
Protein, ur: 100 mg/dL — AB
Specific Gravity, Urine: 1.023 (ref 1.005–1.030)
Urobilinogen, UA: 0.2 mg/dL (ref 0.0–1.0)

## 2011-11-16 LAB — URINE MICROSCOPIC-ADD ON

## 2011-11-16 LAB — TROPONIN I: Troponin I: <0.3 ng/mL (ref <0.30)

## 2011-11-16 LAB — LIPASE, BLOOD: Lipase: 10 U/L — ABNORMAL LOW (ref 11–59)

## 2011-11-16 MED ORDER — SODIUM CHLORIDE 0.9 % IV SOLN
750.0000 mg | INTRAVENOUS | Status: DC
  Administered 2011-11-16 – 2011-11-17 (×2): 750 mg via INTRAVENOUS
  Filled 2011-11-16 (×2): qty 750

## 2011-11-16 MED ORDER — MOXIFLOXACIN HCL IN NACL 400 MG/250ML IV SOLN
400.0000 mg | Freq: Once | INTRAVENOUS | Status: AC
  Administered 2011-11-16: 400 mg via INTRAVENOUS
  Filled 2011-11-16: qty 250

## 2011-11-16 MED ORDER — NITROGLYCERIN 0.4 MG SL SUBL: 0.4000 mg | SUBLINGUAL_TABLET | SUBLINGUAL | Status: DC | PRN

## 2011-11-16 MED ORDER — ASPIRIN-DIPYRIDAMOLE ER 25-200 MG PO CP12
1.0000 | ORAL_CAPSULE | Freq: Two times a day (BID) | ORAL | Status: DC
  Administered 2011-11-16 – 2011-11-20 (×8): 1 via ORAL
  Filled 2011-11-16 (×9): qty 1

## 2011-11-16 MED ORDER — ACETAMINOPHEN 325 MG PO TABS
650.0000 mg | ORAL_TABLET | Freq: Four times a day (QID) | ORAL | Status: DC | PRN
  Administered 2011-11-16 – 2011-11-19 (×4): 650 mg via ORAL
  Filled 2011-11-16 (×4): qty 2

## 2011-11-16 MED ORDER — METOPROLOL SUCCINATE ER 50 MG PO TB24
50.0000 mg | ORAL_TABLET | Freq: Two times a day (BID) | ORAL | Status: DC
  Administered 2011-11-16 – 2011-11-20 (×8): 50 mg via ORAL
  Filled 2011-11-16 (×9): qty 1

## 2011-11-16 MED ORDER — ONDANSETRON HCL 4 MG/2ML IJ SOLN: 4.0000 mg | Freq: Four times a day (QID) | INTRAMUSCULAR | Status: DC | PRN

## 2011-11-16 MED ORDER — SODIUM CHLORIDE 0.9 % IV BOLUS (SEPSIS)
500.0000 mL | Freq: Once | INTRAVENOUS | Status: DC
  Administered 2011-11-16: 18:00:00 via INTRAVENOUS

## 2011-11-16 MED ORDER — LEFLUNOMIDE 20 MG PO TABS
10.0000 mg | ORAL_TABLET | Freq: Every day | ORAL | Status: DC
  Administered 2011-11-17 – 2011-11-20 (×4): 10 mg via ORAL
  Filled 2011-11-16 (×4): qty 1

## 2011-11-16 MED ORDER — ONDANSETRON HCL 4 MG PO TABS: 4.0000 mg | ORAL_TABLET | Freq: Four times a day (QID) | ORAL | Status: DC | PRN

## 2011-11-16 MED ORDER — GLIPIZIDE ER 2.5 MG PO TB24
2.5000 mg | ORAL_TABLET | Freq: Every day | ORAL | Status: DC
  Administered 2011-11-17 – 2011-11-20 (×4): 2.5 mg via ORAL
  Filled 2011-11-16 (×5): qty 1

## 2011-11-16 MED ORDER — BUDESONIDE-FORMOTEROL FUMARATE 160-4.5 MCG/ACT IN AERO
2.0000 | INHALATION_SPRAY | Freq: Two times a day (BID) | RESPIRATORY_TRACT | Status: DC
  Administered 2011-11-17 – 2011-11-20 (×6): 2 via RESPIRATORY_TRACT
  Filled 2011-11-16: qty 6

## 2011-11-16 MED ORDER — OMEGA-3-ACID ETHYL ESTERS 1 G PO CAPS
2.0000 g | ORAL_CAPSULE | Freq: Every day | ORAL | Status: DC
  Administered 2011-11-17 – 2011-11-20 (×4): 2 g via ORAL
  Filled 2011-11-16 (×4): qty 2

## 2011-11-16 MED ORDER — FUROSEMIDE 10 MG/ML IJ SOLN
20.0000 mg | Freq: Once | INTRAMUSCULAR | Status: AC
  Administered 2011-11-16: 20 mg via INTRAVENOUS
  Filled 2011-11-16: qty 2

## 2011-11-16 MED ORDER — ACETAMINOPHEN 650 MG RE SUPP: 650.0000 mg | Freq: Four times a day (QID) | RECTAL | Status: DC | PRN

## 2011-11-16 MED ORDER — MOXIFLOXACIN HCL IN NACL 400 MG/250ML IV SOLN
400.0000 mg | INTRAVENOUS | Status: DC
  Administered 2011-11-17 – 2011-11-20 (×3): 400 mg via INTRAVENOUS
  Filled 2011-11-16 (×4): qty 250

## 2011-11-16 MED ORDER — ENOXAPARIN SODIUM 40 MG/0.4ML ~~LOC~~ SOLN
40.0000 mg | SUBCUTANEOUS | Status: DC
  Administered 2011-11-17 – 2011-11-18 (×2): 40 mg via SUBCUTANEOUS
  Filled 2011-11-16 (×2): qty 0.4

## 2011-11-16 MED ORDER — SODIUM CHLORIDE 0.9 % IJ SOLN
3.0000 mL | Freq: Two times a day (BID) | INTRAMUSCULAR | Status: DC
  Administered 2011-11-17 – 2011-11-20 (×6): 3 mL via INTRAVENOUS

## 2011-11-16 MED ORDER — LOPERAMIDE HCL 2 MG PO CAPS
2.0000 mg | ORAL_CAPSULE | ORAL | Status: DC | PRN
Start: 2011-11-16 — End: 2011-11-20

## 2011-11-16 MED ORDER — SODIUM CHLORIDE 0.9 % IJ SOLN
3.0000 mL | Freq: Two times a day (BID) | INTRAMUSCULAR | Status: DC
  Administered 2011-11-16 – 2011-11-18 (×2): 3 mL via INTRAVENOUS

## 2011-11-16 MED ORDER — ROSUVASTATIN CALCIUM 40 MG PO TABS
40.0000 mg | ORAL_TABLET | Freq: Every day | ORAL | Status: DC
  Administered 2011-11-17 – 2011-11-20 (×4): 40 mg via ORAL
  Filled 2011-11-16 (×4): qty 1

## 2011-11-16 MED ORDER — LEVOFLOXACIN IN D5W 500 MG/100ML IV SOLN
500.0000 mg | INTRAVENOUS | Status: DC
  Filled 2011-11-16: qty 100

## 2011-11-16 NOTE — Progress Notes (Signed)
ANTIBIOTIC CONSULT NOTE - INITIAL  Pharmacy Consult for vancomycin Indication: rule out pneumonia  Allergies  Allergen Reactions  . ZOX:[id number] Acid+Aspartame     REACTION: diarrhea    Patient Measurements: Height: 5' (152.4 cm) Weight: 129 lb 3 oz (58.6 kg) IBW/kg (Calculated) : 45.5   Vital Signs: Temp: 101.1 F (38.4 C) (02/18 2206) Temp src: Oral (02/18 2206) BP: 111/68 mmHg (02/18 2206) Pulse Rate: 82  (02/18 2206)  Labs:  Basename 11/16/11 1750  WBC 11.1*  HGB 13.0  PLT 188  LABCREA --  CREATININE 0.90   Estimated Creatinine Clearance: 36.6 ml/min (by C-G formula based on Cr of 0.9).  Microbiology: No results found for this or any previous visit (from the past 720 hour(s)).  Medical History: Past Medical History  Diagnosis Date  . Coronary heart disease   . CVA (cerebral infarction) 04/25/10    MRI L parietal lobe infarction   . Hyperlipidemia   . Diabetes mellitus, type 2   . GERD (gastroesophageal reflux disease)   . 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 Rheumatoid arthritis     on Arava. Dx for 20 yrs  . Aneurysm   . Myocardial infarction     Medications:  Prescriptions prior to admission  Medication Sig Dispense Refill  . dipyridamole-aspirin (AGGRENOX) 25-200 MG per 12 hr capsule Take 1 capsule by mouth 2 (two) times daily.       Marland Kitchen glipiZIDE (GLUCOTROL XL) 2.5 MG 24 hr tablet Take 2.5 mg by mouth daily.        Marland Kitchen leflunomide (ARAVA) 20 MG tablet Take 10 mg by mouth daily.       Marland Kitchen loperamide (IMODIUM A-D) 2 MG tablet Take 2 mg by mouth as needed. For diaherra      . metoprolol (TOPROL-XL) 50 MG 24 hr tablet Take 50 mg by mouth 2 (two) times daily.        . Multiple Vitamins-Minerals (CENTRUM SILVER PO) Take 1 tablet by mouth daily.       . nitroGLYCERIN (NITROSTAT) 0.4 MG SL tablet Place 0.4 mg under the tongue every 5 (five) minutes as needed. For chest pain       . Omega-3 Fatty  Acids (FISH OIL) 1200 MG CAPS Take 1 capsule by mouth daily.       . rosuvastatin (CRESTOR) 40 MG tablet Take 1 tablet (40 mg total) by mouth daily.  30 tablet  11  . SYMBICORT 160-4.5 MCG/ACT inhaler INHALE TWO (2) PUFF(S) BY MOUTH TW ICE DAILY ** RINSE MOUTH AF TER EACH USE  10.2 g  3  . Abatacept (ORENCIA IV) Inject into the vein. Every 4 weeks        Scheduled:    . budesonide-formoterol  2 puff Inhalation BID  . dipyridamole-aspirin  1 capsule Oral BID  . enoxaparin  40 mg Subcutaneous Q24H  . furosemide  20 mg Intravenous Once  . glipiZIDE  2.5 mg Oral Q breakfast  . leflunomide  10 mg Oral Daily  . metoprolol succinate  50 mg Oral BID  . moxifloxacin  400 mg Intravenous Once  . moxifloxacin  400 mg Intravenous Q24H  . omega-3 acid ethyl esters  2 g Oral Daily  . rosuvastatin  40 mg Oral Daily  . sodium chloride  3 mL Intravenous Q12H  . sodium chloride  3 mL Intravenous Q12H  . vancomycin  750 mg Intravenous Q24H  . DISCONTD: levofloxacin (LEVAQUIN)  IV  500 mg Intravenous Q24H  . DISCONTD: sodium chloride  500 mL Intravenous Once   Assessment: 78yo female c/o SOB and cough, CXR shows probable atelectasis though PNA cannot be ruled out, to begin IV ABX.  Goal of Therapy:  Vancomycin trough level 15-20 mcg/ml  Plan:  Will begin vancomycin 750mg  IV Q24H and monitor CBC, Cx, levels prn.  Colleen Can PharmD BCPS 11/16/2011,11:03 PM

## 2011-11-16 NOTE — ED Notes (Signed)
Pt. Reports she had some diarrhea last wk.

## 2011-11-16 NOTE — ED Notes (Signed)
Cough. Aching all over. No fever. Family states she seems confused.

## 2011-11-16 NOTE — ED Notes (Signed)
Attempted to call report to RN at [facility] and the RN is busy with another Pt.

## 2011-11-16 NOTE — ED Provider Notes (Signed)
History     CSN: [id number]  Arrival date & time 11/16/11  1628   None     Chief Complaint  Patient presents with  . URI    (Consider location/radiation/quality/duration/timing/severity/associated sxs/prior treatment) Patient is a 78 y.o. female presenting with URI. The history is provided by the patient. No language interpreter was used.  URI The primary symptoms include fever, fatigue and cough. Primary symptoms do not include headaches, ear pain, sore throat, swollen glands, wheezing, abdominal pain, nausea or vomiting. The current episode started 6 to 7 days ago. This is a new problem. The problem has been gradually worsening.  The illness is not associated with chills, plugged ear sensation, facial pain, sinus pressure, congestion or rhinorrhea. Risk factors for severe complications from URI include chronic cardiac disease, diabetes mellitus, chronic respiratory disease and being elderly.     Patient here today with complaint of shortness of breath that is worsening since last Tuesday when she got her teeth cleaned. (patient was put on no antibiotics)  Patient's past medical history of CABG with valve replacement. CVA, diabetes type 2, GERD, pulmonary fibrosis, ra, stroke, MI. States that she is on home O2 at night. Lives alone.  Patient also states that she had diarrhea for several x2 days ago. She took some Imodium and the diarrhea has subsided. Denies chest pain or chest tightness presently. Initially she said she was having left flank pain. Upon reassessment she does not. We will check a lipase with her labs and do a cardiac workup.  Past Medical History  Diagnosis Date  . Coronary heart disease   . CVA (cerebral infarction) 04/25/10    MRI L parietal lobe infarction   . Hyperlipidemia   . Diabetes mellitus, type 2   . GERD (gastroesophageal reflux disease)   . 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 Rheumatoid arthritis     on Arava. Dx  for 20 yrs  . Aneurysm     "on my heart; dr said I didn't have to worry about it, it's not big enough yet"  . Hypertension   . Myocardial infarction 1998; 2005  . Pneumonia 11/16/11    "first time ever"  . Anemia   . Blood transfusion   . Stroke     Past Surgical History  Procedure Date  . Breast reduction surgery   . Cholecystectomy   . Appendectomy   . Tonsillectomy   . Vesicovaginal fistula closure w/ tah   . Carpal tunnel release     left  . Coronary angioplasty with stent placement ~ 2002    "1"  . Hernia repair     umbilical  . Abdominal hysterectomy   . Cataract extraction w/ intraocular lens  implant, bilateral   . Coronary artery bypass graft 1998    vCABG X4 w/alve repair    Family History  Problem Relation Age of Onset  . Heart disease Mother   . Heart disease Brother     x 5  . Heart disease Sister     x 5  . Heart disease Father     History  Substance Use Topics  . Smoking status: Never Smoker   . Smokeless tobacco: Never Used  . Alcohol Use: No     rare    OB History    Grav Para Term Preterm Abortions TAB SAB Ect Mult Living  Review of Systems  Constitutional: Positive for fever and fatigue. Negative for chills.  HENT: Negative for ear pain, congestion, sore throat, rhinorrhea and sinus pressure.   Respiratory: Positive for cough. Negative for wheezing.   Gastrointestinal: Negative for nausea, vomiting and abdominal pain.  Neurological: Negative for headaches.    Allergies  WUJ:[id number]+AOZHYQMVH+QIONGEXBMW acid+aspartame  Home Medications   No current outpatient prescriptions on file.  BP 109/76  Pulse 76  Temp(Src) 99 F (37.2 C) (Oral)  Resp 18  Ht 5' (1.524 m)  Wt 122 lb 5.7 oz (55.5 kg)  BMI 23.90 kg/m2  SpO2 94%  Physical Exam  Nursing note and vitals reviewed. Constitutional: She is oriented to person, place, and time. She appears well-developed and well-nourished.  HENT:  Head: Normocephalic  and atraumatic.  Eyes: Conjunctivae and EOM are normal. Pupils are equal, round, and reactive to light.  Neck: Normal range of motion. Neck supple.  Cardiovascular: Normal rate, regular rhythm and intact distal pulses.        ? Valve click  Pulmonary/Chest: Effort normal. She has rales.  Abdominal: Soft. Bowel sounds are normal. She exhibits no distension. There is no tenderness.  Musculoskeletal: Normal range of motion. She exhibits no edema and no tenderness.  Neurological: She is alert and oriented to person, place, and time. She has normal reflexes. No cranial nerve deficit.  Skin: Skin is warm and dry.  Psychiatric: She has a normal mood and affect.    ED Course  Procedures (including critical care time)  Labs Reviewed  CBC - Abnormal; Notable for the following:    WBC 11.1 (*)    All other components within normal limits  DIFFERENTIAL - Abnormal; Notable for the following:    Neutro Abs 7.9 (*)    Monocytes Relative 13 (*)    Monocytes Absolute 1.4 (*)    All other components within normal limits  URINALYSIS, ROUTINE W REFLEX MICROSCOPIC - Abnormal; Notable for the following:    Color, Urine AMBER (*) BIOCHEMICALS MAY BE AFFECTED BY COLOR   Hgb urine dipstick SMALL (*)    Ketones, ur 15 (*)    Protein, ur 100 (*)    All other components within normal limits  COMPREHENSIVE METABOLIC PANEL - Abnormal; Notable for the following:    Glucose, Bld 128 (*)    Albumin 3.1 (*)    AST 40 (*)    GFR calc non Af Amer 57 (*)    GFR calc Af Amer 66 (*)    All other components within normal limits  LIPASE, BLOOD - Abnormal; Notable for the following:    Lipase 10 (*)    All other components within normal limits  URINE MICROSCOPIC-ADD ON - Abnormal; Notable for the following:    Bacteria, UA MANY (*)    All other components within normal limits  PRO B NATRIURETIC PEPTIDE - Abnormal; Notable for the following:    Pro B Natriuretic peptide (BNP) 1110.0 (*)    All other components  within normal limits  CARDIAC PANEL(CRET KIN+CKTOT+MB+TROPI) - Abnormal; Notable for the following:    Total CK 521 (*)    All other components within normal limits  CARDIAC PANEL(CRET KIN+CKTOT+MB+TROPI) - Abnormal; Notable for the following:    Total CK 423 (*)    All other components within normal limits  COMPREHENSIVE METABOLIC PANEL - Abnormal; Notable for the following:    Glucose, Bld 106 (*)    Creatinine, Ser 1.17 (*)    Albumin 2.6 (*)  AST 38 (*)    GFR calc non Af Amer 41 (*)    GFR calc Af Amer 48 (*)    All other components within normal limits  CBC - Abnormal; Notable for the following:    WBC 12.1 (*)    RBC 3.77 (*)    All other components within normal limits  DIFFERENTIAL - Abnormal; Notable for the following:    Neutro Abs 9.0 (*)    Monocytes Relative 13 (*)    Monocytes Absolute 1.5 (*)    All other components within normal limits  CBC - Abnormal; Notable for the following:    WBC 11.2 (*)    RBC 3.82 (*)    All other components within normal limits  CREATININE, SERUM - Abnormal; Notable for the following:    GFR calc non Af Amer 55 (*)    GFR calc Af Amer 64 (*)    All other components within normal limits  PRO B NATRIURETIC PEPTIDE - Abnormal; Notable for the following:    Pro B Natriuretic peptide (BNP) 985.5 (*)    All other components within normal limits  CARDIAC PANEL(CRET KIN+CKTOT+MB+TROPI) - Abnormal; Notable for the following:    Total CK 404 (*)    All other components within normal limits  HEMOGLOBIN A1C - Abnormal; Notable for the following:    Hemoglobin A1C 6.7 (*)    Mean Plasma Glucose 146 (*)    All other components within normal limits  GLUCOSE, CAPILLARY - Abnormal; Notable for the following:    Glucose-Capillary 109 (*)    All other components within normal limits  GLUCOSE, CAPILLARY - Abnormal; Notable for the following:    Glucose-Capillary 113 (*)    All other components within normal limits  BASIC METABOLIC PANEL -  Abnormal; Notable for the following:    Glucose, Bld 112 (*)    BUN 37 (*) DELTA CHECK NOTED   Creatinine, Ser 1.51 (*)    GFR calc non Af Amer 30 (*)    GFR calc Af Amer 35 (*)    All other components within normal limits  CK - Abnormal; Notable for the following:    Total CK 706 (*)    All other components within normal limits  GLUCOSE, CAPILLARY - Abnormal; Notable for the following:    Glucose-Capillary 122 (*)    All other components within normal limits  GLUCOSE, CAPILLARY - Abnormal; Notable for the following:    Glucose-Capillary 123 (*)    All other components within normal limits  GLUCOSE, CAPILLARY - Abnormal; Notable for the following:    Glucose-Capillary 107 (*)    All other components within normal limits  GLUCOSE, CAPILLARY - Abnormal; Notable for the following:    Glucose-Capillary 161 (*)    All other components within normal limits  TROPONIN I  CULTURE, BLOOD (ROUTINE X 2)  CULTURE, BLOOD (ROUTINE X 2)  INFLUENZA PANEL BY PCR  GLUCOSE, CAPILLARY  CBC  BASIC METABOLIC PANEL   No results found.   1. SOB (shortness of breath)   2. Pneumonia   3. Diabetes mellitus   4. RA (rheumatoid arthritis)   5. HYPERTENSION   6. CORONARY HEART DISEASE   7. PULMONARY FIBROSIS, POSTINFLAMMATORY       MDM   78 year old female coming in with shortness of breath and cough x 5 days. Left lower lobe pneumonia on chest x-ray. Will admit to hospitalist at [facility].  Care link to transfer.  Allergic to amoxicillin.  No levaquin in the ER today.  Treated with Moxifloxacin 400 mg IV.  Blood cultures ordered as well.  Labs Reviewed  CBC - Abnormal; Notable for the following:    WBC 11.1 (*)    All other components within normal limits  DIFFERENTIAL - Abnormal; Notable for the following:    Neutro Abs 7.9 (*)    Monocytes Relative 13 (*)    Monocytes Absolute 1.4 (*)    All other components within normal limits  URINALYSIS, ROUTINE W REFLEX MICROSCOPIC - Abnormal;  Notable for the following:    Color, Urine AMBER (*) BIOCHEMICALS MAY BE AFFECTED BY COLOR   Hgb urine dipstick SMALL (*)    Ketones, ur 15 (*)    Protein, ur 100 (*)    All other components within normal limits  COMPREHENSIVE METABOLIC PANEL - Abnormal; Notable for the following:    Glucose, Bld 128 (*)    Albumin 3.1 (*)    AST 40 (*)    GFR calc non Af Amer 57 (*)    GFR calc Af Amer 66 (*)    All other components within normal limits  LIPASE, BLOOD - Abnormal; Notable for the following:    Lipase 10 (*)    All other components within normal limits  URINE MICROSCOPIC-ADD ON - Abnormal; Notable for the following:    Bacteria, UA MANY (*)    All other components within normal limits  PRO B NATRIURETIC PEPTIDE - Abnormal; Notable for the following:    Pro B Natriuretic peptide (BNP) 1110.0 (*)    All other components within normal limits  CARDIAC PANEL(CRET KIN+CKTOT+MB+TROPI) - Abnormal; Notable for the following:    Total CK 521 (*)    All other components within normal limits  CARDIAC PANEL(CRET KIN+CKTOT+MB+TROPI) - Abnormal; Notable for the following:    Total CK 423 (*)    All other components within normal limits  COMPREHENSIVE METABOLIC PANEL - Abnormal; Notable for the following:    Glucose, Bld 106 (*)    Creatinine, Ser 1.17 (*)    Albumin 2.6 (*)    AST 38 (*)    GFR calc non Af Amer 41 (*)    GFR calc Af Amer 48 (*)    All other components within normal limits  CBC - Abnormal; Notable for the following:    WBC 12.1 (*)    RBC 3.77 (*)    All other components within normal limits  DIFFERENTIAL - Abnormal; Notable for the following:    Neutro Abs 9.0 (*)    Monocytes Relative 13 (*)    Monocytes Absolute 1.5 (*)    All other components within normal limits  CBC - Abnormal; Notable for the following:    WBC 11.2 (*)    RBC 3.82 (*)    All other components within normal limits  CREATININE, SERUM - Abnormal; Notable for the following:    GFR calc non Af Amer  55 (*)    GFR calc Af Amer 64 (*)    All other components within normal limits  PRO B NATRIURETIC PEPTIDE - Abnormal; Notable for the following:    Pro B Natriuretic peptide (BNP) 985.5 (*)    All other components within normal limits  CARDIAC PANEL(CRET KIN+CKTOT+MB+TROPI) - Abnormal; Notable for the following:    Total CK 404 (*)    All other components within normal limits  HEMOGLOBIN A1C - Abnormal; Notable for the following:    Hemoglobin A1C 6.7 (*)  Mean Plasma Glucose 146 (*)    All other components within normal limits  GLUCOSE, CAPILLARY - Abnormal; Notable for the following:    Glucose-Capillary 109 (*)    All other components within normal limits  GLUCOSE, CAPILLARY - Abnormal; Notable for the following:    Glucose-Capillary 113 (*)    All other components within normal limits  BASIC METABOLIC PANEL - Abnormal; Notable for the following:    Glucose, Bld 112 (*)    BUN 37 (*) DELTA CHECK NOTED   Creatinine, Ser 1.51 (*)    GFR calc non Af Amer 30 (*)    GFR calc Af Amer 35 (*)    All other components within normal limits  CK - Abnormal; Notable for the following:    Total CK 706 (*)    All other components within normal limits  GLUCOSE, CAPILLARY - Abnormal; Notable for the following:    Glucose-Capillary 122 (*)    All other components within normal limits  GLUCOSE, CAPILLARY - Abnormal; Notable for the following:    Glucose-Capillary 123 (*)    All other components within normal limits  GLUCOSE, CAPILLARY - Abnormal; Notable for the following:    Glucose-Capillary 107 (*)    All other components within normal limits  GLUCOSE, CAPILLARY - Abnormal; Notable for the following:    Glucose-Capillary 161 (*)    All other components within normal limits  TROPONIN I  CULTURE, BLOOD (ROUTINE X 2)  CULTURE, BLOOD (ROUTINE X 2)  INFLUENZA PANEL BY PCR  GLUCOSE, CAPILLARY  CBC  BASIC METABOLIC PANEL         Jethro Bastos, NP 11/18/11 2111

## 2011-11-16 NOTE — H&P (Signed)
Briana Ray is an 78 y.o. female.   PCP - Dr.Ross Pulmonologist - Dr.Wright Cardiologist - Dr.Crenshaw.  Chief Complaint: Chest pain. And HPI: 78 year-old female with history of coronary artery disease status post CABG and status post stenting in 2005, history rheumatoid arthritis with pulmonary fibrosis, history of diabetes mellitus type 2, CVA, abdominal aortic aneurysm presented to the ER at [facility] with chest pain cough and subjective feeling of fever and chills. Patient has been having these symptoms for last 2 days. Chest pain is pleuritic in nature and happens only on deep inspiration and cough. Patient usually has dry cough but over the past 2 days it has been productive. Patient denies any nausea vomiting abdominal pain. She did have diarrhea 2 days ago. In the ER patient was found to be mildly febrile and chest x-ray showing left lower lobe infiltrates and mild leukocytosis. Patient has been admitted for pneumonia.  Past Medical History  Diagnosis Date  . Coronary heart disease   . CVA (cerebral infarction) 04/25/10    MRI L parietal lobe infarction   . Hyperlipidemia   . Diabetes mellitus, type 2   . GERD (gastroesophageal reflux disease)   . 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 Rheumatoid arthritis     on Arava. Dx for 20 yrs  . Aneurysm   . Myocardial infarction     Past Surgical History  Procedure Date  . Breast reduction surgery   . Cholecystectomy   . Appendectomy   . Coronary artery bypass graft 1998    valve repair  . Cardiac stents   . Umbilical hernia repair   . Tonsillectomy   . Vesicovaginal fistula closure w/ tah   . Carpal tunnel release   . Abdominal hysterectomy     Family History  Problem Relation Age of Onset  . Heart disease Mother   . Heart disease Brother     x 5  . Heart disease Sister     x 5  . Heart disease Father    Social History:  reports that she has never smoked. She has  never used smokeless tobacco. She reports that she does not drink alcohol. Her drug history not on file.  Allergies:  Allergies  Allergen Reactions  . ZOX:[id number] Acid+Aspartame     REACTION: diarrhea    Medications Prior to Admission  Medication Dose Route Frequency Provider Last Rate Last Dose  . acetaminophen (TYLENOL) tablet 650 mg  650 mg Oral Q6H PRN Eduard Clos, MD       Or  . acetaminophen (TYLENOL) suppository 650 mg  650 mg Rectal Q6H PRN Eduard Clos, MD      . budesonide-formoterol (SYMBICORT) 160-4.5 MCG/ACT inhaler 2 puff  2 puff Inhalation BID Eduard Clos, MD      . dipyridamole-aspirin (AGGRENOX) 25-200 MG per 12 hr capsule 1 capsule  1 capsule Oral BID Eduard Clos, MD      . enoxaparin (LOVENOX) injection 40 mg  40 mg Subcutaneous Q24H Eduard Clos, MD      . furosemide (LASIX) injection 20 mg  20 mg Intravenous Once Eduard Clos, MD      . glipiZIDE (GLUCOTROL XL) 24 hr tablet 2.5 mg  2.5 mg Oral Q breakfast Eduard Clos, MD      . leflunomide (ARAVA) tablet 10 mg  10 mg Oral Daily Eduard Clos, MD      .  loperamide (IMODIUM A-D) tablet 2 mg  2 mg Oral PRN Eduard Clos, MD      . metoprolol succinate (TOPROL-XL) 24 hr tablet 50 mg  50 mg Oral BID Eduard Clos, MD      . moxifloxacin (AVELOX) IVPB 400 mg  400 mg Intravenous Once Jethro Bastos, NP 250 mL/hr at 11/16/11 1946 400 mg at 11/16/11 1946  . moxifloxacin (AVELOX) IVPB 400 mg  400 mg Intravenous Q24H Eduard Clos, MD      . nitroGLYCERIN (NITROSTAT) SL tablet 0.4 mg  0.4 mg Sublingual Q5 min PRN Eduard Clos, MD      . omega-3 acid ethyl esters (LOVAZA) capsule 2 g  2 g Oral Daily Eduard Clos, MD      . ondansetron [facility]) tablet 4 mg  4 mg Oral Q6H PRN Eduard Clos, MD       Or  . ondansetron (ZOFRAN) injection 4 mg  4 mg Intravenous Q6H PRN Eduard Clos, MD        . rosuvastatin (CRESTOR) tablet 40 mg  40 mg Oral Daily Eduard Clos, MD      . sodium chloride 0.9 % injection 3 mL  3 mL Intravenous Q12H Eduard Clos, MD      . sodium chloride 0.9 % injection 3 mL  3 mL Intravenous Q12H Eduard Clos, MD      . DISCONTD: levofloxacin (LEVAQUIN) IVPB 500 mg  500 mg Intravenous Q24H Jethro Bastos, NP      . DISCONTD: sodium chloride 0.9 % bolus 500 mL  500 mL Intravenous Once Jethro Bastos, NP       Medications Prior to Admission  Medication Sig Dispense Refill  . dipyridamole-aspirin (AGGRENOX) 25-200 MG per 12 hr capsule Take 1 capsule by mouth 2 (two) times daily.       Marland Kitchen glipiZIDE (GLUCOTROL XL) 2.5 MG 24 hr tablet Take 2.5 mg by mouth daily.        Marland Kitchen leflunomide (ARAVA) 20 MG tablet Take 10 mg by mouth daily.       Marland Kitchen loperamide (IMODIUM A-D) 2 MG tablet Take 2 mg by mouth as needed. For diaherra      . metoprolol (TOPROL-XL) 50 MG 24 hr tablet Take 50 mg by mouth 2 (two) times daily.        . Multiple Vitamins-Minerals (CENTRUM SILVER PO) Take 1 tablet by mouth daily.       . nitroGLYCERIN (NITROSTAT) 0.4 MG SL tablet Place 0.4 mg under the tongue every 5 (five) minutes as needed. For chest pain       . Omega-3 Fatty Acids (FISH OIL) 1200 MG CAPS Take 1 capsule by mouth daily.       . rosuvastatin (CRESTOR) 40 MG tablet Take 1 tablet (40 mg total) by mouth daily.  30 tablet  11  . SYMBICORT 160-4.5 MCG/ACT inhaler INHALE TWO (2) PUFF(S) BY MOUTH TW ICE DAILY ** RINSE MOUTH AF TER EACH USE  10.2 g  3  . Abatacept (ORENCIA IV) Inject into the vein. Every 4 weeks         Results for orders placed during the hospital encounter of 11/16/11 (from the past 48 hour(s))  URINALYSIS, ROUTINE W REFLEX MICROSCOPIC     Status: Abnormal   Collection Time   11/16/11  5:39 PM      Component Value Range Comment   Color, Urine AMBER (*) YELLOW  BIOCHEMICALS MAY  BE AFFECTED BY COLOR   APPearance CLEAR  CLEAR     Specific Gravity, Urine  1.023  1.005 - 1.030     pH 5.5  5.0 - 8.0     Glucose, UA NEGATIVE  NEGATIVE (mg/dL)    Hgb urine dipstick SMALL (*) NEGATIVE     Bilirubin Urine NEGATIVE  NEGATIVE     Ketones, ur 15 (*) NEGATIVE (mg/dL)    Protein, ur 213 (*) NEGATIVE (mg/dL)    Urobilinogen, UA 0.2  0.0 - 1.0 (mg/dL)    Nitrite NEGATIVE  NEGATIVE     Leukocytes, UA NEGATIVE  NEGATIVE    URINE MICROSCOPIC-ADD ON     Status: Abnormal   Collection Time   11/16/11  5:39 PM      Component Value Range Comment   Squamous Epithelial / LPF RARE  RARE     WBC, UA 0-2  <3 (WBC/hpf)    RBC / HPF 0-2  <3 (RBC/hpf)    Bacteria, UA MANY (*) RARE    CBC     Status: Abnormal   Collection Time   11/16/11  5:50 PM      Component Value Range Comment   WBC 11.1 (*) 4.0 - 10.5 (K/uL)    RBC 4.10  3.87 - 5.11 (MIL/uL)    Hemoglobin 13.0  12.0 - 15.0 (g/dL)    HCT 08.6  57.8 - 46.9 (%)    MCV 94.9  78.0 - 100.0 (fL)    MCH 31.7  26.0 - 34.0 (pg)    MCHC 33.4  30.0 - 36.0 (g/dL)    RDW 62.9  52.8 - 41.3 (%)    Platelets 188  150 - 400 (K/uL)   DIFFERENTIAL     Status: Abnormal   Collection Time   11/16/11  5:50 PM      Component Value Range Comment   Neutrophils Relative 71  43 - 77 (%)    Neutro Abs 7.9 (*) 1.7 - 7.7 (K/uL)    Lymphocytes Relative 16  12 - 46 (%)    Lymphs Abs 1.7  0.7 - 4.0 (K/uL)    Monocytes Relative 13 (*) 3 - 12 (%)    Monocytes Absolute 1.4 (*) 0.1 - 1.0 (K/uL)    Eosinophils Relative 0  0 - 5 (%)    Eosinophils Absolute 0.0  0.0 - 0.7 (K/uL)    Basophils Relative 0  0 - 1 (%)    Basophils Absolute 0.0  0.0 - 0.1 (K/uL)   COMPREHENSIVE METABOLIC PANEL     Status: Abnormal   Collection Time   11/16/11  5:50 PM      Component Value Range Comment   Sodium 135  135 - 145 (mEq/L)    Potassium 4.1  3.5 - 5.1 (mEq/L)    Chloride 101  96 - 112 (mEq/L)    CO2 25  19 - 32 (mEq/L)    Glucose, Bld 128 (*) 70 - 99 (mg/dL)    BUN 14  6 - 23 (mg/dL)    Creatinine, Ser 2.44  0.50 - 1.10 (mg/dL)    Calcium 9.6   8.4 - 10.5 (mg/dL)    Total Protein 7.2  6.0 - 8.3 (g/dL)    Albumin 3.1 (*) 3.5 - 5.2 (g/dL)    AST 40 (*) 0 - 37 (U/L)    ALT 31  0 - 35 (U/L)    Alkaline Phosphatase 61  39 - 117 (U/L)    Total Bilirubin 0.6  0.3 - 1.2 (mg/dL)    GFR calc non Af Amer 57 (*) >90 (mL/min)    GFR calc Af Amer 66 (*) >90 (mL/min)   LIPASE, BLOOD     Status: Abnormal   Collection Time   11/16/11  5:50 PM      Component Value Range Comment   Lipase 10 (*) 11 - 59 (U/L)   TROPONIN I     Status: Normal   Collection Time   11/16/11  5:50 PM      Component Value Range Comment   Troponin I <0.30  <0.30 (ng/mL)   PRO B NATRIURETIC PEPTIDE     Status: Abnormal   Collection Time   11/16/11  5:50 PM      Component Value Range Comment   Pro B Natriuretic peptide (BNP) 1110.0 (*) 0 - 450 (pg/mL)    Dg Chest Port 1 View  11/16/2011  *RADIOLOGY REPORT*  Clinical Data: Shortness of breath, cough, diffuse aches  PORTABLE CHEST - 1 VIEW  Comparison: Portable chest x-ray of 08/05/2011  Findings: There is cardiomegaly present with moderate pulmonary vascular congestion.  Somewhat prominent markings at the left lung base are noted probably due to atelectasis, but pneumonia not excluded and follow-up is recommended.  Median sternotomy sutures are noted from prior CABG and valve replacement.  The bones are osteopenic.  IMPRESSION: Cardiomegaly and probable moderate pulmonary vascular congestion. Linear atelectasis or pneumonia at the left lung base.  Recommend follow-up.  Original Report Authenticated By: Juline Patch, M.D.    Review of Systems  Constitutional: Positive for fever and chills.  HENT: Negative.   Eyes: Negative.   Respiratory: Positive for cough and sputum production.   Cardiovascular: Positive for chest pain.  Gastrointestinal: Negative.   Genitourinary: Negative.   Musculoskeletal: Negative.   Skin: Negative.   Neurological: Negative.   Endo/Heme/Allergies: Negative.   Psychiatric/Behavioral: Negative.      Blood pressure 111/68, pulse 82, temperature 101.1 F (38.4 C), temperature source Oral, resp. rate 22, height 5' (1.524 m), weight 58.6 kg (129 lb 3 oz), SpO2 99.00%. Physical Exam  Constitutional: She is oriented to person, place, and time. She appears well-developed and well-nourished. No distress.  HENT:  Head: Normocephalic and atraumatic.  Right Ear: External ear normal.  Left Ear: External ear normal.  Nose: Nose normal.  Mouth/Throat: Oropharynx is clear and moist. No oropharyngeal exudate.  Eyes: Conjunctivae and EOM are normal. Pupils are equal, round, and reactive to light. Right eye exhibits no discharge. Left eye exhibits no discharge. No scleral icterus.  Neck: Normal range of motion. Neck supple.  Cardiovascular: Normal rate and regular rhythm.   Respiratory: Effort normal. No respiratory distress. She has no wheezes. She has rales.  GI: Soft. Bowel sounds are normal. She exhibits no distension. There is no tenderness. There is no rebound.  Musculoskeletal: Normal range of motion. She exhibits no edema and no tenderness.  Neurological: She is alert and oriented to person, place, and time.       Moves upper and lower extremities.  Skin: Skin is warm and dry. No rash noted. She is not diaphoretic. No erythema.  Psychiatric: Her behavior is normal.     Assessment/Plan #1. Pneumonia - patient lives at home but takes immunosuppressants. We will treat this as community-acquired pneumonia but will add vancomycin to Avelox. #2. Shortness of breath - patient is short of breath and she tries to talk to me but not in acute distress. I think there is  a component of vascular congestion in addition to her pneumonia and pulmonary fibrosis causing her shortness of breath. I will give her one dose of Lasix 20 mg IV and closely follow her intake output and recheck metabolic panel in a.m. #3. History of CAD status post CABG status post stenting - her chest pain is pleuritic. But given her  shortness of breath we will cycle cardiac markers. #4. History of rheumatoid arthritis - continue present medications. #5. History of diabetes mellitus type 2 - continue present medications and sliding scale. #6. History of abdominal aortic aneurysm  - no abdominal pain at this time.  CODE STATUS  - full code.   Eduard Clos 11/16/2011, 10:46 PM

## 2011-11-17 ENCOUNTER — Encounter (HOSPITAL_COMMUNITY): Payer: Self-pay | Admitting: General Practice

## 2011-11-17 LAB — CBC: RDW: 14.9 % (ref 11.5–15.5)

## 2011-11-17 LAB — COMPREHENSIVE METABOLIC PANEL
ALT: 29 U/L (ref 0–35)
AST: 38 U/L — ABNORMAL HIGH (ref 0–37)
Alkaline Phosphatase: 61 U/L (ref 39–117)
CO2: 27 mEq/L (ref 19–32)
Chloride: 103 mEq/L (ref 96–112)
GFR calc non Af Amer: 41 mL/min — ABNORMAL LOW (ref >90)
Sodium: 138 mEq/L (ref 135–145)
Total Bilirubin: 0.4 mg/dL (ref 0.3–1.2)

## 2011-11-17 LAB — GLUCOSE, CAPILLARY
Glucose-Capillary: 109 mg/dL — ABNORMAL HIGH (ref 70–99)
Glucose-Capillary: 113 mg/dL — ABNORMAL HIGH (ref 70–99)

## 2011-11-17 LAB — CARDIAC PANEL(CRET KIN+CKTOT+MB+TROPI)
CK, MB: 2.4 ng/mL (ref 0.3–4.0)
Relative Index: 0.6 (ref 0.0–2.5)
Total CK: 521 U/L — ABNORMAL HIGH (ref 7–177)

## 2011-11-17 LAB — DIFFERENTIAL
Basophils Absolute: 0 10*3/uL (ref 0.0–0.1)
Eosinophils Relative: 1 % (ref 0–5)
Lymphocytes Relative: 13 % (ref 12–46)
Lymphs Abs: 1.5 10*3/uL (ref 0.7–4.0)
Monocytes Absolute: 1.5 10*3/uL — ABNORMAL HIGH (ref 0.1–1.0)
Neutro Abs: 9 10*3/uL — ABNORMAL HIGH (ref 1.7–7.7)

## 2011-11-17 LAB — INFLUENZA PANEL BY PCR (TYPE A & B)
H1N1 flu by pcr: NOT DETECTED
Influenza A By PCR: NEGATIVE
Influenza B By PCR: NEGATIVE

## 2011-11-17 LAB — CREATININE, SERUM
GFR calc Af Amer: 64 mL/min — ABNORMAL LOW (ref >90)
GFR calc non Af Amer: 55 mL/min — ABNORMAL LOW (ref >90)

## 2011-11-17 MED ORDER — INSULIN ASPART 100 UNIT/ML ~~LOC~~ SOLN
0.0000 [IU] | Freq: Three times a day (TID) | SUBCUTANEOUS | Status: DC
  Administered 2011-11-18: 1 [IU] via SUBCUTANEOUS
  Administered 2011-11-18: 2 [IU] via SUBCUTANEOUS
  Administered 2011-11-20: 1 [IU] via SUBCUTANEOUS
  Filled 2011-11-17: qty 3

## 2011-11-17 MED ORDER — FUROSEMIDE 40 MG PO TABS
40.0000 mg | ORAL_TABLET | Freq: Every day | ORAL | Status: DC
  Administered 2011-11-17 – 2011-11-20 (×4): 40 mg via ORAL
  Filled 2011-11-17 (×4): qty 1

## 2011-11-17 NOTE — Progress Notes (Signed)
   CARE MANAGEMENT NOTE 11/17/2011  Patient:  Briana Ray, Briana Ray   Account Number:  [id number]  Date Initiated:  11/17/2011  Documentation initiated by:  Letha Cape  Subjective/Objective Assessment:   dx pna  admit- lives alone.  Has home oxygen with [facility].  pta independent.     Action/Plan:   pt eval- rec hhpt   Anticipated DC Date:  11/19/2011   Anticipated DC Plan:  HOME W HOME HEALTH SERVICES      DC Planning Services  CM consult      [facility] Choice  HOME HEALTH   Choice offered to / List presented to:  C-1 Patient        HH arranged  HH-2 PT      [facility] agency  [facility]   Status of service:  In process, will continue to follow Medicare Important Message given?   (If response is "NO", the following Medicare IM given date fields will be blank) Date Medicare IM given:   Date Additional Medicare IM given:    Discharge Disposition:    Per UR Regulation:    Comments:  PCP Miguel Aschoff and [facility]  11/17/11 15:54 Letha Cape RN, BSN [phone] patient lives alone, pta independent.  Patient has home oxygen with [facility].  Patient has medication coverage.  Pysical therpay has rec hhpt.  Patient and son chose [facility] for hhpt. Referral made to [facility] via [facility] and [facility] notified.  Soc will begin 24-48 hrs post discharge.  Son Fayrene Fearing [phone] states if patient is dc home before Thursday at noon he can be reached to pick pt up, if dc Thursday after noon time will need to contact Fayrene Fearing wife Bonita Quin [phone].   Friday patient will not have any transportation available because he will not be in town.

## 2011-11-17 NOTE — Plan of Care (Signed)
Problem: Phase II Progression Outcomes Goal: Activity at appropriate level-compared to baseline (UP IN CHAIR FOR HEMODIALYSIS)  Outcome: Progressing Pt ambulated 200 ft today with physical therapy and minA.

## 2011-11-17 NOTE — Evaluation (Signed)
Physical Therapy Evaluation Patient Details Name: Briana Ray MRN: [id number] DOB: [birth year] Today's Date: 11/17/2011  Problem List:  Patient Active Problem List  Diagnoses  . DIABETES MELLITUS, TYPE II  . HYPERLIPIDEMIA  . HYPERTENSION  . CORONARY HEART DISEASE  . C V A / STROKE  . PULMONARY FIBROSIS, POSTINFLAMMATORY  . GERD  . Rheumatoid arthritis  . MI  . ARTHRITIS  . Abdominal aortic aneurysm  . Unstable angina  . Pneumonia  . Diabetes mellitus  . SOB (shortness of breath)    Past Medical History:  Past Medical History  Diagnosis Date  . Coronary heart disease   . CVA (cerebral infarction) 04/25/10    MRI L parietal lobe infarction   . Hyperlipidemia   . Diabetes mellitus, type 2   . GERD (gastroesophageal reflux disease)   . 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 Rheumatoid arthritis     on Arava. Dx for 20 yrs  . Aneurysm     "on my heart; dr said I didn't have to worry about it, it's not big enough yet"  . Hypertension   . Myocardial infarction 1998; 2005  . Pneumonia 11/16/11    "first time ever"  . Anemia   . Blood transfusion   . Stroke    Past Surgical History:  Past Surgical History  Procedure Date  . Breast reduction surgery   . Cholecystectomy   . Appendectomy   . Tonsillectomy   . Vesicovaginal fistula closure w/ tah   . Carpal tunnel release     left  . Coronary angioplasty with stent placement ~ 2002    "1"  . Hernia repair     umbilical  . Abdominal hysterectomy   . Cataract extraction w/ intraocular lens  implant, bilateral   . Coronary artery bypass graft 1998    vCABG X4 w/alve repair    PT Assessment/Plan/Recommendation PT Assessment HPI: 78 year-old female with history of coronary artery disease status post CABG and status post stenting in 2005, history rheumatoid arthritis with pulmonary fibrosis, history of diabetes mellitus type 2, CVA, abdominal aortic aneurysm presented to the  ER at [facility] with chest pain cough and subjective feeling of fever and chills. Patient has been having these symptoms for last 2 days. Chest pain is pleuritic in nature and happens only on deep inspiration and cough. Patient usually has dry cough but over the past 2 days it has been productive. Patient denies any nausea vomiting abdominal pain. She did have diarrhea 2 days ago. In the ER patient was found to be mildly febrile and chest x-ray showing left lower lobe infiltrates and mild leukocytosis. Patient has been admitted for pneumonia.  Clinical Impression Statement: Seen for PT eval today 11/17/11. Pt moving well but demonstrating balance deficits with gait and transfers. Would benefit physical therapy in the acute setting to address these and the following problem list so as to increase safety upon d/c home. Rec HHPT for d/c.  PT Recommendation/Assessment: Patient will need skilled PT in the acute care venue PT Problem List: Decreased strength;Decreased activity tolerance;Decreased balance;Decreased mobility;Decreased knowledge of use of DME;Decreased safety awareness Problem List Comments: Initially pt very reluctant to HHPT but agreeable with explaination about balance and safety. May possibly benefit from using RW but pt may be reluctant.  Barriers to Discharge: Decreased caregiver support PT Therapy Diagnosis : Difficulty walking;Abnormality of gait;Generalized weakness;Acute pain PT Plan PT Frequency: Min 3X/week  PT Treatment/Interventions: DME instruction;Gait training;Functional mobility training;Therapeutic activities;Therapeutic exercise;Balance training;Patient/family education;Neuromuscular re-education PT Recommendation Recommendations for Other Services: OT consult Follow Up Recommendations: Home health PT and Supervision - Intermittent Equipment Recommended: None recommended by PT PT Goals  Acute Rehab PT Goals PT Goal Formulation: With patient Time For Goal  Achievement: 7 days Pt will go Sit to Stand: with modified independence PT Goal: Sit to Stand - Progress: Goal set today Pt will go Stand to Sit: with modified independence PT Goal: Stand to Sit - Progress: Goal set today Pt will Transfer Bed to Chair/Chair to Bed: with modified independence PT Transfer Goal: Bed to Chair/Chair to Bed - Progress: Goal set today Pt will Ambulate: >150 feet;with modified independence;with least restrictive assistive device PT Goal: Ambulate - Progress: Goal set today Pt will Perform Home Exercise Program: Independently PT Goal: Perform Home Exercise Program - Progress: Goal set today Additional Goals Additional Goal #1: Pt will demonstrate decreased risk of falls with Berg score >/= 47/56 PT Goal: Additional Goal #1 - Progress: Goal set today  PT Evaluation Precautions/Restrictions  Precautions Precautions: Fall Prior Functioning  Home Living Lives With: Alone Receives Help From: Family (son and daughter in law live down the street) Type of Home: House (town house) Home Layout: One level Home Access: Level entry Home Adaptive Equipment: Walker - rolling;Straight cane Prior Function Level of Independence: Independent with basic ADLs;Independent with homemaking with ambulation;Requires assistive device for independence;Independent with transfers (uses SPC at home and in community) Driving: Yes Vocation: Retired Financial risk analyst Arousal/Alertness: Awake/alert Overall Cognitive Status: Appears within functional limits for tasks assessed Orientation Level: Oriented X4 Sensation/Coordination Sensation Light Touch: Appears Intact Coordination Gross Motor Movements are Fluid and Coordinated: Yes Fine Motor Movements are Fluid and Coordinated: Yes Extremity Assessment RUE Assessment RUE Assessment: Within Functional Limits LUE Assessment LUE Assessment: Within Functional Limits RLE Assessment RLE Assessment:  (grossly 4/5) LLE Assessment LLE  Assessment:  (grossly 4/5) Mobility (including Balance) Bed Mobility Bed Mobility: Yes Supine to Sit: 6: Modified independent (Device/Increase time) Transfers Transfers: Yes Sit to Stand: 5: Supervision;With upper extremity assist;From bed Stand to Sit: 5: Supervision;With upper extremity assist;To chair/3-in-1 Ambulation/Gait Ambulation/Gait: Yes Ambulation/Gait Assistance: 4: Min assist Ambulation/Gait Assistance Details (indicate cue type and reason): amb minA with wide BOS; decreased stance time on right with decreased stride; rotated and leaning left slightly during gait Ambulation Distance (Feet): 200 Feet Assistive device: None Gait Pattern: Antalgic;Lateral trunk lean to left  Posture/Postural Control Posture/Postural Control: No significant limitations Balance Balance Assessed: Yes Static Sitting Balance Static Sitting - Balance Support: No upper extremity supported Static Sitting - Level of Assistance: 7: Independent Static Standing Balance Static Standing - Balance Support: No upper extremity supported Static Standing - Level of Assistance: 5: Stand by assistance Static Standing - Comment/# of Minutes: during gait however pt needing minA without any upper extremity support for stability Exercise    End of Session PT - End of Session Equipment Utilized During Treatment: Gait belt Activity Tolerance: Patient tolerated treatment well Patient left: in chair;with call bell in reach Nurse Communication: Mobility status for transfers;Mobility status for ambulation General Behavior During Session: [facility] for tasks performed Cognition: [facility] for tasks performed  [facility] HELEN 11/17/2011, 3:32 PM

## 2011-11-17 NOTE — Progress Notes (Signed)
Utilization review completed.  

## 2011-11-17 NOTE — Progress Notes (Signed)
Patient PCR influenza negative, will dc droplet precautions per protocol. Tarell Schollmeyer, Randall An RN

## 2011-11-17 NOTE — Progress Notes (Signed)
Subjective: Patient seen and examined this am. admission H&P reviewed. informs her breathing to be better today.   Objective:  Vital signs in last 24 hours:  Filed Vitals:   11/17/11 0000 11/17/11 0546 11/17/11 0906 11/17/11 1030  BP:  110/68  115/60  Pulse:  70    Temp: 97.9 F (36.6 C) 98.4 F (36.9 C)    TempSrc: Oral Oral    Resp:  20    Height:      Weight:  58.9 kg (129 lb 13.6 oz)    SpO2:  98% 98%     Intake/Output from previous day:   Intake/Output Summary (Last 24 hours) at 11/17/11 1047 Last data filed at 11/17/11 1030  Gross per 24 hour  Intake    243 ml  Output    951 ml  Net   -708 ml    Physical Exam:  General: elderly female in no acute distress. HEENT: no pallor, no icterus, moist oral mucosa, no JVD, no lymphadenopathy Heart: Normal  s1 &s2  Regular rate and rhythm, without murmurs, rubs, gallops. Lungs: bibasilar coarse crackles, no rhonchi or wheeze Abdomen: Soft, nontender, nondistended, positive bowel sounds. Extremities: No clubbing cyanosis or edema with positive pedal pulses. Neuro: Alert, awake, oriented x3, nonfocal.   Lab Results:  Basic Metabolic Panel:    Component Value Date/Time   NA 138 11/17/2011 0657   K 3.9 11/17/2011 0657   CL 103 11/17/2011 0657   CO2 27 11/17/2011 0657   BUN 18 11/17/2011 0657   CREATININE 1.17* 11/17/2011 0657   GLUCOSE 106* 11/17/2011 0657   CALCIUM 9.3 11/17/2011 0657   CBC:    Component Value Date/Time   WBC 12.1* 11/17/2011 0657   HGB 12.1 11/17/2011 0657   HCT 36.2 11/17/2011 0657   PLT 184 11/17/2011 0657   MCV 96.0 11/17/2011 0657   NEUTROABS 9.0* 11/17/2011 0657   LYMPHSABS 1.5 11/17/2011 0657   MONOABS 1.5* 11/17/2011 0657   EOSABS 0.1 11/17/2011 0657   BASOSABS 0.0 11/17/2011 0657    No results found for this or any previous visit (from the past 240 hour(s)).  Studies/Results: Dg Chest Port 1 View  11/16/2011  *RADIOLOGY REPORT*  Clinical Data: Shortness of breath, cough, diffuse aches   PORTABLE CHEST - 1 VIEW  Comparison: Portable chest x-ray of 08/05/2011  Findings: There is cardiomegaly present with moderate pulmonary vascular congestion.  Somewhat prominent markings at the left lung base are noted probably due to atelectasis, but pneumonia not excluded and follow-up is recommended.  Median sternotomy sutures are noted from prior CABG and valve replacement.  The bones are osteopenic.  IMPRESSION: Cardiomegaly and probable moderate pulmonary vascular congestion. Linear atelectasis or pneumonia at the left lung base.  Recommend follow-up.  Original Report Authenticated By: Juline Patch, M.D.    Medications: Scheduled Meds:   . budesonide-formoterol  2 puff Inhalation BID  . dipyridamole-aspirin  1 capsule Oral BID  . enoxaparin  40 mg Subcutaneous Q24H  . furosemide  20 mg Intravenous Once  . glipiZIDE  2.5 mg Oral Q breakfast  . insulin aspart  0-9 Units Subcutaneous TID WC  . leflunomide  10 mg Oral Daily  . metoprolol succinate  50 mg Oral BID  . moxifloxacin  400 mg Intravenous Once  . moxifloxacin  400 mg Intravenous Q24H  . omega-3 acid ethyl esters  2 g Oral Daily  . rosuvastatin  40 mg Oral Daily  . sodium chloride  3 mL Intravenous Q12H  .  sodium chloride  3 mL Intravenous Q12H  . vancomycin  750 mg Intravenous Q24H  . DISCONTD: levofloxacin (LEVAQUIN) IV  500 mg Intravenous Q24H  . DISCONTD: sodium chloride  500 mL Intravenous Once   Continuous Infusions:  PRN Meds:.acetaminophen, acetaminophen, loperamide, nitroGLYCERIN, ondansetron (ZOFRAN) IV, ondansetron  Assessment 78 y/o female with DM type 2 , HTN, CAD s/p CABG, hx of pulm fibrosis, AAA, sent from [facility] for SOB, cough and pleurisy with subjective fever and admitted for Left lung base PNA.     Plan: Principal Problem:  Community acquired Pneumonia PatieNt presented with fever of 101 , SOB with cough LLL , mild leucocytosis and LLL  infiltrate on CXR  Patient started on IV vanco  and avelox and will be continued Symptoms clinically improving leucocytosis improving and remains afebrile flu PCR ordered and will follow   Active issues:  Shortness of breath  possibly related to her underlying PNA and pulm fibrosis. Her symptoms did improve with IV lasix ( on admission) and has some elevated pro BNP with pulm vascular congestion on cxr.  EF from exercise myoview in nov 2012 was 55% Will given her low dose lasix and monitor  hold off on further echo  follows with [facility] cards   Pleuritic chest pain  serial CE negative for ACS Mildly elevated CPK  Symptoms now resolved  monitor cpk in am   Diabetes mellitus, type 2 Cont glipizide. Monitor fsg and SSI   HYPERTENSION Stable   cont home meds   History of CAD status post CABG status post stenting -  chest pain is pleuritic. Serial trops negative  #4. History of rheumatoid arthritis  - continue home meds.    . History of abdominal aortic aneurysm  - no abdominal pain at this time.  DVT prophylaxis  Diet: cardiac/ diabetic  Full code    LOS: 1 day   Jomar Denz 11/17/2011, 10:47 AM

## 2011-11-18 LAB — GLUCOSE, CAPILLARY
Glucose-Capillary: 123 mg/dL — ABNORMAL HIGH (ref 70–99)
Glucose-Capillary: 107 mg/dL — ABNORMAL HIGH (ref 70–99)

## 2011-11-18 LAB — BASIC METABOLIC PANEL
Chloride: 104 mEq/L (ref 96–112)
GFR calc Af Amer: 35 mL/min — ABNORMAL LOW (ref >90)
GFR calc non Af Amer: 30 mL/min — ABNORMAL LOW (ref >90)
Potassium: 3.5 mEq/L (ref 3.5–5.1)

## 2011-11-18 LAB — CK: Total CK: 706 U/L — ABNORMAL HIGH (ref 7–177)

## 2011-11-18 MED ORDER — ENOXAPARIN SODIUM 30 MG/0.3ML ~~LOC~~ SOLN
30.0000 mg | SUBCUTANEOUS | Status: DC
  Administered 2011-11-19 – 2011-11-20 (×2): 30 mg via SUBCUTANEOUS
  Filled 2011-11-18 (×2): qty 0.3

## 2011-11-18 MED ORDER — VANCOMYCIN HCL 500 MG IV SOLR
500.0000 mg | INTRAVENOUS | Status: DC
  Administered 2011-11-19 – 2011-11-20 (×2): 500 mg via INTRAVENOUS
  Filled 2011-11-18 (×2): qty 500

## 2011-11-18 NOTE — Progress Notes (Signed)
Patient ID: Briana Ray, female   DOB: 23-Apr-1924, 78 y.o.   MRN: [id number]  Subjective: No events overnight. Patient denies chest pain, shortness of breath, abdominal pain. Patient reported 1 bowel movement today.  Objective:  Vital signs in last 24 hours:  Filed Vitals:   11/17/11 2144 11/18/11 0536 11/18/11 1330  BP: 116/76 113/72 109/76  Pulse: 67 60 75  Temp: 99.4 F (37.4 C) 97.6 F (36.4 C) 99 F (37.2 C)  TempSrc: Oral  Oral  Resp: 20 19 19   Weight:  55.5 kg (122 lb 5.7 oz)   SpO2: 95% 100% 97%    Intake/Output from previous day:   Intake/Output Summary (Last 24 hours) at 11/18/11 1440 Last data filed at 11/18/11 1300  Gross per 24 hour  Intake    840 ml  Output   1052 ml  Net   -212 ml    Physical Exam: General: Alert, awake, oriented x3, in no acute distress. HEENT: No bruits, no goiter. Moist mucous membranes, no scleral icterus, no conjunctival pallor. Heart: Regular rate and rhythm, S1/S2 +, no murmurs, rubs, gallops. Lungs: Clear to auscultation bilaterally. No wheezing, no rhonchi, no rales.  Abdomen: Soft, tenderness over left mid abdomen quadrant, nondistended, positive bowel sounds. Extremities: No clubbing or cyanosis, no pitting edema,  positive pedal pulses. Neuro: Grossly nonfocal.  Lab Results:  Lab 11/17/11 0657 11/16/11 2330 11/16/11 1750  WBC 12.1* 11.2* 11.1*  HGB 12.1 12.2 13.0  HCT 36.2 36.6 38.9  PLT 184 180 188  MCV 96.0 95.8 94.9    Lab 11/18/11 0516 11/17/11 0657 11/16/11 1750  NA 139 138 135  K 3.5 3.9 4.1  CL 104 103 101  CO2 26 27 25   GLUCOSE 112* 106* 128*  BUN 37* 18 14  CREATININE 1.51* 1.17* 0.90  CALCIUM 9.0 9.3 9.6   Lab 11/17/11 1419 11/17/11 0651 11/16/11 2328  CKMB 2.4 2.6 2.2  TROPONINI <0.30 <0.30 <0.30   No components found with this basename: POCBNP:3 Recent Results (from the past 240 hour(s))  CULTURE, BLOOD (ROUTINE X 2)     Status: Normal (Preliminary result)   Collection Time   11/16/11   7:15 PM      Component Value Range Status Comment   Specimen Description BLOOD RT HAND   Final    Special Requests NONE BOTTLES DRAWN AEROBIC AND ANAEROBIC 5CC   Final    Culture  Setup Time [id number]   Final    Culture     Final    Value:        BLOOD CULTURE RECEIVED NO GROWTH TO DATE CULTURE WILL BE HELD FOR 5 DAYS BEFORE ISSUING A FINAL NEGATIVE REPORT   Report Status PENDING   Incomplete   CULTURE, BLOOD (ROUTINE X 2)     Status: Normal (Preliminary result)   Collection Time   11/16/11  7:15 PM      Component Value Range Status Comment   Specimen Description BLOOD  RT ARM   Final    Special Requests NONE BOTTLES DRAWN AEROBIC AND ANAEROBIC 5CC   Final    Culture  Setup Time [id number]   Final    Culture     Final    Value:        BLOOD CULTURE RECEIVED NO GROWTH TO DATE CULTURE WILL BE HELD FOR 5 DAYS BEFORE ISSUING A FINAL NEGATIVE REPORT   Report Status PENDING   Incomplete     Studies/Results: Dg Chest Port 1 [facility]  11/16/2011  IMPRESSION: Cardiomegaly and probable moderate pulmonary vascular congestion. Linear atelectasis or pneumonia at the left lung base.  Recommend follow-up.     Medications: Scheduled Meds:   . budesonide-formoterol  2 puff Inhalation BID  . dipyridamole-aspirin  1 capsule Oral BID  . enoxaparin (LOVENOX) injection  30 mg Subcutaneous Q24H  . furosemide  40 mg Oral Daily  . glipiZIDE  2.5 mg Oral Q breakfast  . insulin aspart  0-9 Units Subcutaneous TID WC  . leflunomide  10 mg Oral Daily  . metoprolol succinate  50 mg Oral BID  . moxifloxacin  400 mg Intravenous Q24H  . omega-3 acid ethyl esters  2 g Oral Daily  . rosuvastatin  40 mg Oral Daily  . sodium chloride  3 mL Intravenous Q12H  . sodium chloride  3 mL Intravenous Q12H  . vancomycin  500 mg Intravenous Q24H  . DISCONTD: enoxaparin  40 mg Subcutaneous Q24H  . DISCONTD: vancomycin  750 mg Intravenous Q24H   Continuous Infusions:  PRN Meds:.acetaminophen, acetaminophen, loperamide,  nitroGLYCERIN, ondansetron (ZOFRAN) IV, ondansetron  Assessment/Plan:  Principal Problem:   Community acquired Pneumonia  - patient afebrile in last 24 hours - blood cultures to date show no growth - continue n IV vanco and avelox  - flu test negative  Active issues:   Shortness of breath  - likely related to PNA versus pulmonary fibrosis.  - EF 2012 was 55%  - continue lasix 40 mg daily  Acute Kidney Injury - likely secondary to lasix - follow up in am BMP - we will consider stopping lasix tomorrow if Cr worsens  Pleuritic chest pain  - serial CE negative for ACS  - symptoms resolved   Diabetes mellitus, type 2  - continue SSI - continue CBG monitoring - continue glipizide  HYPERTENSION  - BP at goal - cont home meds   History of rheumatoid arthritis  - continue leflunomide     LOS: 2 days   Raife Lizer 11/18/2011, 2:40 PM  [facility] Pager: [phone]

## 2011-11-18 NOTE — Progress Notes (Addendum)
ANTIBIOTIC CONSULT NOTE - follow up Pharmacy Consult for vancomycin/avelox day # 2 Indication: rule out pneumonia  Allergies  Allergen Reactions  . WUJ:WJXBJYNWGNF+AOZHYQMVH+QIONGEXBMW Acid+Aspartame     REACTION: diarrhea    Patient Measurements: Height: 5' (152.4 cm) Weight: 122 lb 5.7 oz (55.5 kg) IBW/kg (Calculated) : 45.5   Vital Signs: Temp: 97.6 F (36.4 C) (02/20 0536) BP: 113/72 mmHg (02/20 0536) Pulse Rate: 60  (02/20 0536)  Labs:  Basename 11/18/11 0516 11/17/11 0657 11/16/11 2330 11/16/11 1750  WBC -- 12.1* 11.2* 11.1*  HGB -- 12.1 12.2 13.0  PLT -- 184 180 188  LABCREA -- -- -- --  CREATININE 1.51* 1.17* 0.92 --   Estimated Creatinine Clearance: 21.3 ml/min (by C-G formula based on Cr of 1.51).  Microbiology: Recent Results (from the past 720 hour(s))  CULTURE, BLOOD (ROUTINE X 2)     Status: Normal (Preliminary result)   Collection Time   11/16/11  7:15 PM      Component Value Range Status Comment   Specimen Description BLOOD RT HAND   Final    Special Requests NONE BOTTLES DRAWN AEROBIC AND ANAEROBIC 5CC   Final    Culture  Setup Time [id number]   Final    Culture     Final    Value:        BLOOD CULTURE RECEIVED NO GROWTH TO DATE CULTURE WILL BE HELD FOR 5 DAYS BEFORE ISSUING A FINAL NEGATIVE REPORT   Report Status PENDING   Incomplete   CULTURE, BLOOD (ROUTINE X 2)     Status: Normal (Preliminary result)   Collection Time   11/16/11  7:15 PM      Component Value Range Status Comment   Specimen Description BLOOD  RT ARM   Final    Special Requests NONE BOTTLES DRAWN AEROBIC AND ANAEROBIC 5CC   Final    Culture  Setup Time [id number]   Final    Culture     Final    Value:        BLOOD CULTURE RECEIVED NO GROWTH TO DATE CULTURE WILL BE HELD FOR 5 DAYS BEFORE ISSUING A FINAL NEGATIVE REPORT   Report Status PENDING   Incomplete     Medical History: Past Medical History  Diagnosis Date  . Coronary heart disease   . CVA (cerebral infarction)  04/25/10    MRI L parietal lobe infarction   . Hyperlipidemia   . Diabetes mellitus, type 2   . GERD (gastroesophageal reflux disease)   . 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 Rheumatoid arthritis     on [place]. Dx for 20 yrs  . Aneurysm     "on my heart; dr said I didn't have to worry about it, it's not big enough yet"  . Hypertension   . Myocardial infarction 1998; 2005  . Pneumonia 11/16/11    "first time ever"  . Anemia   . Blood transfusion   . Stroke    Assessment: 78yo female on avelox and vancomycin day #2 for  CXR shows probable atelectasis though PNA cannot be ruled out. T max 99.4. BC x 2 no growth to date. Creatinine has increased to 1.51 and creat cl ! 21 ml/min.  She has rec'd 2 doses of vancomycin 750mg  IV q24 and 2 doses of avelox 400mg  IV q24.  Goal of Therapy:  Vancomycin trough level 15-20 mcg/ml  Plan:  1. Decrease vanc to 500mg  IV q24  with next dose due 2/21 at 10am. 2. Decrease LMWH to 30mg  q 24 for VTE px. 3. Consider stopping vancomycin 4. Consider changing avelox to PO Thanks!  Len Childs T PharmD  11/18/2011 10:19 AM Pager: [phone]

## 2011-11-18 NOTE — Progress Notes (Signed)
Physical Therapy Treatment Patient Details Name: Briana Ray MRN: [id number] DOB: 05/04/1924 Today's Date: 11/18/2011  PT Assessment/Plan  PT - Assessment/Plan Comments on Treatment Session: [name] looks much better with the RW today but some impaired safety awareness is apparent today. Today during the session this pt reported to me in confidence that she fell earlier but never told her nurses or nurse tech because she didn't think it was a big deal. She reported that she fell onto her bottom and bumped her head on the door.  Rec to pt that she have someone stay with her initially upon going home. She doesn't believe she needs this but given her unreported fall today I believe it would be best. RN reports pt's son will stay with her when she leaves.  PT Plan: Discharge plan needs to be updated;Frequency remains appropriate Follow Up Recommendations: Home health PT;Supervision/Assistance - 24 hour Equipment Recommended: None recommended by PT PT Goals  Acute Rehab PT Goals PT Goal: Sit to Stand - Progress: Progressing toward goal PT Goal: Stand to Sit - Progress: Progressing toward goal PT Transfer Goal: Bed to Chair/Chair to Bed - Progress: Progressing toward goal PT Goal: Ambulate - Progress: Progressing toward goal  PT Treatment Precautions/Restrictions  Precautions Precautions: Fall Precaution Comments: Pt reported to me during our session that she had fallen this morning (11/18/11) in her room landing on her bottom and hitting her head on the door. Reports that she did not tell her nurse because she didn't want people to make a big fuss about this.  Restrictions Weight Bearing Restrictions: No Mobility (including Balance) Bed Mobility Supine to Sit: 6: Modified independent (Device/Increase time) Transfers Sit to Stand: 5: Supervision;With upper extremity assist;From bed;From chair/3-in-1 Stand to Sit: 5: Supervision;With upper extremity assist;To chair/3-in-1;To  bed Ambulation/Gait Ambulation/Gait Assistance: 4: Min assist Ambulation/Gait Assistance Details (indicate cue type and reason): ambulated 200 ft with SPC (on right) demonstrating lateral sway and decreased ability to correct; pt then amb 200 ft with RW and mingaurdA with better ability to steady self using bilatera upper extremity support; cues for safe technique using RW  Ambulation Distance (Feet): 400 Feet Assistive device: Rolling walker;Straight cane Gait Pattern: Trunk flexed  Posture/Postural Control Posture/Postural Control: No significant limitations Exercise    End of Session PT - End of Session Equipment Utilized During Treatment: Gait belt Activity Tolerance: Patient tolerated treatment well Patient left: in bed;with call bell in reach (RN aware of pt's report of fall and to put bed alarm on) Nurse Communication: Mobility status for transfers;Mobility status for ambulation (pt's report of falling earlier today) General Behavior During Session: [name] for tasks performed Cognition: Impaired Cognitive Impairment: some impaired safety awareness today; also demonstrating some memory deficits (repeating a story she had told me earlier in the session)  [facility] HELEN 11/18/2011, 4:38 PM

## 2011-11-18 NOTE — Progress Notes (Signed)
Ambulated patient 550 ft with rolling walker, assist x 1.  Tolerated well.  Returned to bed with bed alarm on.  Vitals stable.  Will continue to monitor.

## 2011-11-19 LAB — CBC
HCT: 35.3 % — ABNORMAL LOW (ref 36.0–46.0)
MCHC: 33.4 g/dL (ref 30.0–36.0)
MCV: 94.9 fL (ref 78.0–100.0)
RDW: 14.8 % (ref 11.5–15.5)

## 2011-11-19 LAB — GLUCOSE, CAPILLARY
Glucose-Capillary: 102 mg/dL — ABNORMAL HIGH (ref 70–99)
Glucose-Capillary: 96 mg/dL (ref 70–99)

## 2011-11-19 LAB — BASIC METABOLIC PANEL
BUN: 40 mg/dL — ABNORMAL HIGH (ref 6–23)
Chloride: 104 mEq/L (ref 96–112)
Creatinine, Ser: 1.45 mg/dL — ABNORMAL HIGH (ref 0.50–1.10)
GFR calc Af Amer: 37 mL/min — ABNORMAL LOW (ref >90)
Glucose, Bld: 112 mg/dL — ABNORMAL HIGH (ref 70–99)

## 2011-11-19 NOTE — Progress Notes (Signed)
Patient ID: Briana Ray, female   DOB: 28-Jul-1924, 78 y.o.   MRN: [id number]  Assessment/Plan:   Principal Problem:   Community acquired Pneumonia  - patient afebrile in last 24 hours  - blood cultures to date show no growth  - continue n IV vanco and avelox  - flu test negative   Active issues:   Shortness of breath  - likely related to PNA versus pulmonary fibrosis.  - EF 2012 was 55%  - continue lasix 40 mg daily   Acute Kidney Injury  - likely secondary to lasix  - follow up in am BMP  - renal function improving  Pleuritic chest pain  - serial CE negative for ACS  - symptoms resolved   Diabetes mellitus, type 2  - continue SSI  - continue CBG monitoring  - continue glipizide   HYPERTENSION  - BP at goal  - cont home meds   History of rheumatoid arthritis  - continue leflunomide   Disposition - plan D/C in am  Subjective: No events overnight. Patient denies chest pain, shortness of breath, abdominal pain.   Objective:  Vital signs in last 24 hours:  Filed Vitals:   11/18/11 1517 11/18/11 2023 11/18/11 2140 11/19/11 0539  BP:   107/65 134/98  Pulse:  76 65 59  Temp:   98.3 F (36.8 C) 98.3 F (36.8 C)  TempSrc:   Oral Oral  Resp:  18 20 19   Height:      Weight:      SpO2: 94%  99% 99%    Intake/Output from previous day:   Intake/Output Summary (Last 24 hours) at 11/19/11 2440 Last data filed at 11/18/11 1700  Gross per 24 hour  Intake    480 ml  Output    501 ml  Net    -21 ml    Physical Exam: General: Alert, awake, oriented x3, in no acute distress. HEENT: No bruits, no goiter. Moist mucous membranes, no scleral icterus, no conjunctival pallor. Heart: Regular rate and rhythm, S1/S2 +, no murmurs, rubs, gallops. Lungs: Clear to auscultation bilaterally. No wheezing, no rhonchi, no rales.  Abdomen: Soft, nontender, nondistended, positive bowel sounds. Extremities: No clubbing or cyanosis, no pitting edema,  positive pedal  pulses. Neuro: Grossly nonfocal.  Lab Results:  Basic Metabolic Panel:    Component Value Date/Time   NA 140 11/19/2011 0633   K 3.8 11/19/2011 0633   CL 104 11/19/2011 0633   CO2 24 11/19/2011 0633   BUN 40* 11/19/2011 0633   CREATININE 1.45* 11/19/2011 0633   GLUCOSE 112* 11/19/2011 0633   CALCIUM 9.1 11/19/2011 0633   CBC:    Component Value Date/Time   WBC 8.7 11/19/2011 0633   HGB 11.8* 11/19/2011 0633   HCT 35.3* 11/19/2011 0633   PLT 238 11/19/2011 0633   MCV 94.9 11/19/2011 0633   NEUTROABS 9.0* 11/17/2011 0657   LYMPHSABS 1.5 11/17/2011 0657   MONOABS 1.5* 11/17/2011 0657   EOSABS 0.1 11/17/2011 0657   BASOSABS 0.0 11/17/2011 0657      Lab 11/19/11 0633 11/17/11 0657 11/16/11 2330 11/16/11 1750  WBC 8.7 12.1* 11.2* 11.1*  HGB 11.8* 12.1 12.2 13.0  HCT 35.3* 36.2 36.6 38.9  PLT 238 184 180 188  MCV 94.9 96.0 95.8 94.9  MCH 31.7 32.1 31.9 31.7  MCHC 33.4 33.4 33.3 33.4  RDW 14.8 14.9 15.0 14.8  LYMPHSABS -- 1.5 -- 1.7  MONOABS -- 1.5* -- 1.4*  EOSABS -- 0.1 -- 0.0  BASOSABS -- 0.0 -- 0.0  BANDABS -- -- -- --    Lab 11/19/11 0633 11/18/11 0516 11/17/11 0657 11/16/11 2330 11/16/11 1750  NA 140 139 138 -- 135  K 3.8 3.5 3.9 -- 4.1  CL 104 104 103 -- 101  CO2 24 26 27  -- 25  GLUCOSE 112* 112* 106* -- 128*  BUN 40* 37* 18 -- 14  CREATININE 1.45* 1.51* 1.17* 0.92 0.90  CALCIUM 9.1 9.0 9.3 -- 9.6  MG -- -- -- -- --   No results found for this basename: INR:5,PROTIME:5 in the last 168 hours Cardiac markers:  Lab 11/17/11 1419 11/17/11 0651 11/16/11 2328  CKMB 2.4 2.6 2.2  TROPONINI <0.30 <0.30 <0.30  MYOGLOBIN -- -- --   No components found with this basename: POCBNP:3 Recent Results (from the past 240 hour(s))  CULTURE, BLOOD (ROUTINE X 2)     Status: Normal (Preliminary result)   Collection Time   11/16/11  7:15 PM      Component Value Range Status Comment   Specimen Description BLOOD RT HAND   Final    Special Requests NONE BOTTLES DRAWN AEROBIC AND ANAEROBIC  5CC   Final    Culture  Setup Time [id number]   Final    Culture     Final    Value:        BLOOD CULTURE RECEIVED NO GROWTH TO DATE CULTURE WILL BE HELD FOR 5 DAYS BEFORE ISSUING A FINAL NEGATIVE REPORT   Report Status PENDING   Incomplete   CULTURE, BLOOD (ROUTINE X 2)     Status: Normal (Preliminary result)   Collection Time   11/16/11  7:15 PM      Component Value Range Status Comment   Specimen Description BLOOD  RT ARM   Final    Special Requests NONE BOTTLES DRAWN AEROBIC AND ANAEROBIC 5CC   Final    Culture  Setup Time [id number]   Final    Culture     Final    Value:        BLOOD CULTURE RECEIVED NO GROWTH TO DATE CULTURE WILL BE HELD FOR 5 DAYS BEFORE ISSUING A FINAL NEGATIVE REPORT   Report Status PENDING   Incomplete     Studies/Results: No results found.  Medications: Scheduled Meds:   . budesonide-formoterol  2 puff Inhalation BID  . dipyridamole-aspirin  1 capsule Oral BID  . enoxaparin (LOVENOX) injection  30 mg Subcutaneous Q24H  . furosemide  40 mg Oral Daily  . glipiZIDE  2.5 mg Oral Q breakfast  . insulin aspart  0-9 Units Subcutaneous TID WC  . leflunomide  10 mg Oral Daily  . metoprolol succinate  50 mg Oral BID  . moxifloxacin  400 mg Intravenous Q24H  . omega-3 acid ethyl esters  2 g Oral Daily  . rosuvastatin  40 mg Oral Daily  . sodium chloride  3 mL Intravenous Q12H  . sodium chloride  3 mL Intravenous Q12H  . vancomycin  500 mg Intravenous Q24H  . DISCONTD: enoxaparin  40 mg Subcutaneous Q24H  . DISCONTD: vancomycin  750 mg Intravenous Q24H   Continuous Infusions:  PRN Meds:.acetaminophen, acetaminophen, loperamide, nitroGLYCERIN, ondansetron (ZOFRAN) IV, ondansetron     LOS: 3 days   Briana Ray 11/19/2011, 9:52 AM  [facility] Pager: [phone]

## 2011-11-20 LAB — BASIC METABOLIC PANEL
BUN: 36 mg/dL — ABNORMAL HIGH (ref 6–23)
CO2: 28 mEq/L (ref 19–32)
Chloride: 104 mEq/L (ref 96–112)
GFR calc non Af Amer: 38 mL/min — ABNORMAL LOW (ref >90)
Glucose, Bld: 146 mg/dL — ABNORMAL HIGH (ref 70–99)
Potassium: 3.2 mEq/L — ABNORMAL LOW (ref 3.5–5.1)

## 2011-11-20 LAB — GLUCOSE, CAPILLARY

## 2011-11-20 MED ORDER — FUROSEMIDE 40 MG PO TABS: 40.0000 mg | ORAL_TABLET | Freq: Every day | ORAL | Status: DC

## 2011-11-20 MED ORDER — MOXIFLOXACIN HCL 400 MG PO TABS: 400.0000 mg | ORAL_TABLET | Freq: Every day | ORAL | Status: AC

## 2011-11-20 NOTE — Progress Notes (Signed)
The patient has frequent bowel movements.  It's semi-formed.

## 2011-11-20 NOTE — Progress Notes (Signed)
HH- RN added to [facility] services with [facility]

## 2011-11-20 NOTE — Progress Notes (Signed)
Physical Therapy Treatment Patient Details Name: Briana Ray MRN: [id number] DOB: [birth year] Today's Date: 11/20/2011  PT Assessment/Plan  PT - Assessment/Plan Comments on Treatment Session: Briana Ray is moving well but I would still recommend 24 hour assist at home as pt reports that she "falls all the time at home." She doesn't seem do understand how serious falling is saying that "I never hurt myself." Discussed the possiblity for life alert but pt reports she hangs her cell phone around her neck. Pt only agreeable to HHPT at this point. I expressed my concern to RN who reports she will discuss this with pt's son when he comes to pick her up this afternoon.  PT Plan: Discharge plan remains appropriate;Frequency remains appropriate PT Frequency: Min 3X/week Recommendations for Other Services: OT consult Follow Up Recommendations: Home health PT;Supervision/Assistance - 24 hour Equipment Recommended: None recommended by PT PT Goals  Acute Rehab PT Goals PT Goal: Sit to Stand - Progress: Progressing toward goal PT Goal: Stand to Sit - Progress: Progressing toward goal PT Transfer Goal: Bed to Chair/Chair to Bed - Progress: Progressing toward goal PT Goal: Ambulate - Progress: Progressing toward goal  PT Treatment Precautions/Restrictions  Precautions Precautions: Fall Precaution Comments: Pt reports multiple falls at home, one of which she could not get up and her son found her. Pt also with fall this admission that was unwitnessed and pt did not want to report this.  Restrictions Weight Bearing Restrictions: No Mobility (including Balance) Bed Mobility Supine to Sit: 6: Modified independent (Device/Increase time) Transfers Sit to Stand: 6: Modified independent (Device/Increase time) Stand to Sit: 5: Supervision Ambulation/Gait Ambulation/Gait Assistance: 5: Supervision Ambulation/Gait Assistance Details (indicate cue type and reason): cues for safe technique with  RW Ambulation Distance (Feet): 350 Feet Assistive device: Rolling walker Gait Pattern: Trunk flexed;Shuffle  Posture/Postural Control Posture/Postural Control: No significant limitations Exercise    End of Session PT - End of Session Equipment Utilized During Treatment: Gait belt Activity Tolerance: Patient tolerated treatment well Patient left: in bed;with call bell in reach;with bed alarm set Nurse Communication: Mobility status for transfers;Mobility status for ambulation (pt's report of multiple falls at home) General Behavior During Session: [facility] for tasks performed Cognition: [name] for tasks performed Cognitive Impairment: Memory deficits evident today, seems to be more aware of her need for a RW and PT but still slight safety awareness deficits  WHITLOW,Briana Ray 11/20/2011, 12:51 PM

## 2011-11-20 NOTE — Discharge Summary (Signed)
Patient ID: JAYLAH GOODLOW MRN: [id number] DOB/AGE: 12-28-1923 78 y.o.  Admit date: 11/16/2011 Discharge date: 11/20/2011  Primary Care Physician:  Miguel Aschoff, MD, MD  Assessment/Plan:   Principal Problem:   Community acquired Pneumonia / Left lower lobes - patient afebrile in last 72 hours  - blood cultures to date show no growth  - we will send patient home with Avelox 400 mg daily for 10 days - flu test negative   Active issues:   Shortness of breath  - likely related to PNA versus pulmonary fibrosis.  - EF 2012 was 55%  - continue lasix 40 mg daily   Acute Kidney Injury  - likely secondary to lasix  - renal function improving   Pleuritic chest pain  - serial CE negative for ACS  - symptoms resolved   Diabetes mellitus, type 2  - continue SSI  - continue CBG monitoring  - continue glipizide   HYPERTENSION  - BP at goal  - cont home meds   History of rheumatoid arthritis  - continue leflunomide   Disposition  - medically stable and clinically appears well for discharge     Medication List  As of 11/20/2011  9:18 AM   ASK your doctor about these medications         CENTRUM SILVER PO   Take 1 tablet by mouth daily.      dipyridamole-aspirin 25-200 MG per 12 hr capsule   Commonly known as: AGGRENOX   Take 1 capsule by mouth 2 (two) times daily.      Fish Oil 1200 MG Caps   Take 1 capsule by mouth daily.      glipiZIDE 2.5 MG 24 hr tablet   Commonly known as: GLUCOTROL XL   Take 2.5 mg by mouth daily.      leflunomide 20 MG tablet   Commonly known as: ARAVA   Take 10 mg by mouth daily.      loperamide 2 MG tablet   Commonly known as: IMODIUM A-D   Take 2 mg by mouth as needed. For diaherra      metoprolol succinate 50 MG 24 hr tablet   Commonly known as: TOPROL-XL   Take 50 mg by mouth 2 (two) times daily.      nitroGLYCERIN 0.4 MG SL tablet   Commonly known as: NITROSTAT   Place 0.4 mg under the tongue every 5 (five) minutes as  needed. For chest pain      ORENCIA IV   Inject into the vein. Every 4 weeks      rosuvastatin 40 MG tablet   Commonly known as: CRESTOR   Take 1 tablet (40 mg total) by mouth daily.      SYMBICORT 160-4.5 MCG/ACT inhaler   Generic drug: budesonide-formoterol   INHALE TWO (2) PUFF(S) BY MOUTH TW ICE DAILY ** RINSE MOUTH AF TER EACH USE            Disposition and Follow-up: - follow up with PCP in 1 week  Consults:   1. Physical therapy - recommended home health PT  Significant Diagnostic Studies:  Dg Chest Port 1 View 11/16/2011  IMPRESSION: Cardiomegaly and probable moderate pulmonary vascular congestion. Linear atelectasis or pneumonia at the left lung base.  Recommend follow-up.     Brief H and P: 78 year-old female with history of coronary artery disease status post CABG and status post stenting in 2005, history rheumatoid arthritis with pulmonary fibrosis, history of diabetes mellitus type 2, CVA, abdominal  aortic aneurysm presented [facility] with chest pain, cough and subjective feeling of fever and chills. Patient has been having these symptoms for last 2 days prior to admission. Chest pain was pleuritic in nature associated with deep inspiration and cough. Patient usually has dry cough but over the past 2 days prior toadmission it has been productive. Patient denied any nausea vomiting abdominal pain. She did have diarrhea 2 days ago. In the ER patient was found to be mildly febrile and chest x-ray showing left lower lobe infiltrates and mild leukocytosis.    Physical Exam on Discharge:  Filed Vitals:   11/19/11 2149 11/19/11 2258 11/20/11 0740 11/20/11 0826  BP: 109/73 112/70 116/78   Pulse: 70 65 54   Temp: 98.3 F (36.8 C)  97.6 F (36.4 C)   TempSrc: Oral  Oral   Resp: 20  20   Height:      Weight:   58.4 kg (128 lb 12 oz)   SpO2: 92%  97% 95%     Intake/Output Summary (Last 24 hours) at 11/20/11 1914 Last data filed at 11/20/11  0700  Gross per 24 hour  Intake    966 ml  Output    556 ml  Net    410 ml    General: Alert, awake, oriented x3, in no acute distress. HEENT: No bruits, no goiter. Heart: Regular rate and rhythm, without murmurs, rubs, gallops. Lungs: Clear to auscultation bilaterally. Abdomen: Soft, nontender, nondistended, positive bowel sounds. Extremities: No clubbing cyanosis or edema with positive pedal pulses. Neuro: Grossly intact, nonfocal.  CBC:    Component Value Date/Time   WBC 8.7 11/19/2011 0633   HGB 11.8* 11/19/2011 0633   HCT 35.3* 11/19/2011 0633   PLT 238 11/19/2011 0633   MCV 94.9 11/19/2011 0633   NEUTROABS 9.0* 11/17/2011 0657   LYMPHSABS 1.5 11/17/2011 0657   MONOABS 1.5* 11/17/2011 0657   EOSABS 0.1 11/17/2011 0657   BASOSABS 0.0 11/17/2011 0657    Basic Metabolic Panel:    Component Value Date/Time   NA 140 11/19/2011 0633   K 3.8 11/19/2011 0633   CL 104 11/19/2011 0633   CO2 24 11/19/2011 0633   BUN 40* 11/19/2011 0633   CREATININE 1.45* 11/19/2011 0633   GLUCOSE 112* 11/19/2011 0633   CALCIUM 9.1 11/19/2011 0633    Time spent on Discharge: Greater than 45 minutes  Signed: Summit Borchardt 11/20/2011, 9:18 AM

## 2011-11-20 NOTE — Progress Notes (Signed)
   CARE MANAGEMENT NOTE 11/20/2011  Patient:  Briana Ray, Briana Ray   Account Number:  [id number]  Date Initiated:  11/17/2011  Documentation initiated by:  Letha Cape  Subjective/Objective Assessment:   dx pna  admit- lives alone.  Has home oxygen with [facility].  pta independent.     Action/Plan:   pt eval- rec hhpt   Anticipated DC Date:  11/19/2011   Anticipated DC Plan:  HOME W HOME HEALTH SERVICES      DC Planning Services  CM consult      [facility] Choice  HOME HEALTH   Choice offered to / List presented to:  C-1 Patient        HH arranged  HH-2 PT  HH-1 RN      [facility] agency  [facility]   Status of service:  Completed, signed off Medicare Important Message given?   (If response is "NO", the following Medicare IM given date fields will be blank) Date Medicare IM given:   Date Additional Medicare IM given:    Discharge Disposition:  HOME W HOME HEALTH SERVICES  Per UR Regulation:    Comments:  PCP Miguel Aschoff and Barboff  11/20/11- 1600- Donn Pierini RN, BSN [phone] pt to d/c home today with [facility]- [facility] already notified.  11/19/11- 1400- Donn Pierini RN, BSN [phone] [facility] notified of addition of HH-RN to [facility] services. Plan is for pt to d/c tomorrow when son Mardelle Matte comes into town. Have spoken with pt's son Fayrene Fearing who is going out of town today and he is in agreement with d/c tomorrow as he can not pick mom up today.  11/17/11 15:54 Letha Cape RN, BSN [phone] patient lives alone, pta independent.  Patient has home oxygen with [facility].  Patient has medication coverage. Physical therpay has rec hhpt.  Patient and son chose [facility] for hhpt. Referral made to [facility] via [facility] and [facility] notified. Soc will begin 24-48 hrs post discharge.  Son Fayrene Fearing [phone] states if patient is dc home before Thursday at noon he can be reached to pick pt up, if dc Thursday after noon time will need to contact Fayrene Fearing wife Bonita Quin [phone]. Friday patient will not have any transportation available because  he will not be in town.

## 2011-11-20 NOTE — Discharge Instructions (Signed)

## 2011-11-20 NOTE — Progress Notes (Signed)
Utilization review complete 

## 2011-11-21 NOTE — ED Provider Notes (Signed)
Medical screening examination/treatment/procedure(s) were performed by non-physician practitioner and as supervising physician I was immediately available for consultation/collaboration.   Samani Deal A. Patrica Duel, MD 11/21/11 [phone]

## 2011-11-23 LAB — CULTURE, BLOOD (ROUTINE X 2)
Culture  Setup Time: 201302190614
Culture: NO GROWTH

## 2011-12-31 ENCOUNTER — Ambulatory Visit (INDEPENDENT_AMBULATORY_CARE_PROVIDER_SITE_OTHER): Payer: Medicare Other | Admitting: Critical Care Medicine

## 2011-12-31 ENCOUNTER — Encounter: Payer: Self-pay | Admitting: Critical Care Medicine

## 2011-12-31 ENCOUNTER — Ambulatory Visit (HOSPITAL_BASED_OUTPATIENT_CLINIC_OR_DEPARTMENT_OTHER)
Admission: RE | Admit: 2011-12-31 | Discharge: 2011-12-31 | Disposition: A | Discharge status: Home / Self Care | Payer: Medicare Other | Source: Ambulatory Visit | Attending: Critical Care Medicine | Admitting: Critical Care Medicine

## 2011-12-31 VITALS — BP 136/86 | HR 62 | Temp 98.0°F | Ht 59.5 in | Wt 128.0 lb

## 2011-12-31 DIAGNOSIS — R0989 Other specified symptoms and signs involving the circulatory and respiratory systems: Secondary | ICD-10-CM | POA: Insufficient documentation

## 2011-12-31 DIAGNOSIS — R05 Cough: Secondary | ICD-10-CM

## 2011-12-31 DIAGNOSIS — R059 Cough, unspecified: Secondary | ICD-10-CM | POA: Insufficient documentation

## 2011-12-31 DIAGNOSIS — M069 Rheumatoid arthritis, unspecified: Secondary | ICD-10-CM | POA: Insufficient documentation

## 2011-12-31 DIAGNOSIS — J8410 Pulmonary fibrosis, unspecified: Secondary | ICD-10-CM

## 2011-12-31 DIAGNOSIS — I517 Cardiomegaly: Secondary | ICD-10-CM | POA: Insufficient documentation

## 2011-12-31 DIAGNOSIS — R0609 Other forms of dyspnea: Secondary | ICD-10-CM | POA: Insufficient documentation

## 2011-12-31 NOTE — Progress Notes (Signed)
Subjective:    Patient ID: Briana Ray, female    DOB: October 31, 1923, 78 y.o.   MRN: [id number]  HPI  78 y.o.   WF with interstital changes on CT chest in setting of RA. Has RA induced interstitial lung disease.  Past Hx pertinent for severe Rheumatoid arthritis Dx 45yrs ago. Rx with Leflunamide. Not on methotrexate.   12/31/2011 Pt admitted 11/16/11 for CAP. D/c on 10days avelox.   Now cough is better, only now 1-2 times daily.  Occ coughs when eating.  Pt notes dysphagia more. Now dyspnea is better. Pt denies any significant sore throat, nasal congestion or excess secretions, fever, chills, sweats, unintended weight loss, pleurtic or exertional chest pain, orthopnea PND, or leg swelling Pt denies any increase in rescue therapy over baseline, denies waking up needing it or having any early am or nocturnal exacerbations of coughing/wheezing/or dyspnea. Pt also denies any obvious fluctuation in symptoms with  weather or environmental change or other alleviating or aggravating factors    Past Medical History  Diagnosis Date  . Coronary heart disease   . CVA (cerebral infarction) 04/25/10    MRI L parietal lobe infarction   . Hyperlipidemia   . Diabetes mellitus, type 2   . GERD (gastroesophageal reflux disease)   . 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 Rheumatoid arthritis     on Arava. Dx for 20 yrs  . Aneurysm     "on my heart; dr said I didn't have to worry about it, it's not big enough yet"  . Hypertension   . Myocardial infarction 1998; 2005  . Pneumonia 11/16/11    "first time ever"  . Anemia   . Blood transfusion   . Stroke      Family History  Problem Relation Age of Onset  . Heart disease Mother   . Heart disease Brother     x 5  . Heart disease Sister     x 5  . Heart disease Father      History   Social History  . Marital Status: Widowed    Spouse Name: N/A    Number of Children: N/A  . Years of Education: N/A    Occupational History  . Retired     [employer]   Social History Main Topics  . Smoking status: Never Smoker   . Smokeless tobacco: Never Used  . Alcohol Use: No     rare  . Drug Use: No  . Sexually Active: Not Currently   Other Topics Concern  . Not on file   Social History Narrative  . No narrative on file     Allergies  Allergen Reactions  . WUJ:[id number]+AOZHYQMVH+QIONGEXBMW Acid+Aspartame     REACTION: diarrhea     Outpatient Prescriptions Prior to Visit  Medication Sig Dispense Refill  . Abatacept (ORENCIA IV) Inject into the vein. Every 4 weeks       . dipyridamole-aspirin (AGGRENOX) 25-200 MG per 12 hr capsule Take 1 capsule by mouth 2 (two) times daily.       . furosemide (LASIX) 40 MG tablet Take 1 tablet (40 mg total) by mouth daily.  30 tablet  0  . glipiZIDE (GLUCOTROL XL) 2.5 MG 24 hr tablet Take 2.5 mg by mouth daily.        Marland Kitchen leflunomide (ARAVA) 20 MG tablet Take 10 mg by mouth daily.       Marland Kitchen loperamide (IMODIUM A-D) 2  MG tablet Take 2 mg by mouth as needed. For diaherra      . metoprolol (TOPROL-XL) 50 MG 24 hr tablet Take 50 mg by mouth 2 (two) times daily.        . Multiple Vitamins-Minerals (CENTRUM SILVER PO) Take 1 tablet by mouth daily.       . nitroGLYCERIN (NITROSTAT) 0.4 MG SL tablet Place 0.4 mg under the tongue every 5 (five) minutes as needed. For chest pain       . Omega-3 Fatty Acids (FISH OIL) 1200 MG CAPS Take 1 capsule by mouth daily.       . rosuvastatin (CRESTOR) 40 MG tablet Take 1 tablet (40 mg total) by mouth daily.  30 tablet  11  . SYMBICORT 160-4.5 MCG/ACT inhaler INHALE TWO (2) PUFF(S) BY MOUTH TW ICE DAILY ** RINSE MOUTH AF TER EACH USE  10.2 g  3     Review of Systems  Constitutional:   No  weight loss, night sweats,  Fevers, chills, fatigue, lassitude.  HEENT:   No headaches,  Difficulty swallowing,  Tooth/dental problems,  Sore throat,                No sneezing, itching, ear ache, nasal congestion, post nasal  drip,   CV:  No chest pain,  Orthopnea, PND, swelling in lower extremities, anasarca, dizziness, palpitations  GI  No heartburn, indigestion,  vomiting, diarrhea,   loss of appetite  Resp: Notes  shortness of breath with exertion not  at rest.  No excess mucus, no  productive cough,  Notes  non-productive cough,  No coughing up of blood.  No change in color of mucus.  No wheezing.  No chest wall deformity  Skin: no rash or lesions.  GU: no dysuria, change in color of urine, no urgency or frequency.  No flank pain.  MS:  +chronic  joint pain     Psych:  No change in mood or affect. No depression or anxiety.  No memory loss.     Objective:   Physical Exam  Filed Vitals:   12/31/11 1153  BP: 136/86  Pulse: 62  Temp: 98 F (36.7 C)  TempSrc: Oral  Height: 4' 11.5" (1.511 m)  Weight: 128 lb (58.06 kg)  SpO2: 97%    Gen: Pleasant, well-nourished, in no distress,  normal affect  ENT: No lesions,  mouth clear,  oropharynx clear, no postnasal drip  Neck: No JVD, no TMG, no carotid bruits  Lungs: No use of accessory muscles, no dullness to percussion, dry rales, poor airflow  Cardiovascular: RRR, heart sounds normal, no murmur or gallops, no peripheral edema  Abdomen: soft and NT, no HSM,  BS normal  Musculoskeletal: No deformities, no cyanosis or clubbing  Neuro: alert, non focal  Skin: Warm, no lesions or rashes  Dg Chest 2 View  12/31/2011  *RADIOLOGY REPORT*  Clinical Data: Cough, dyspnea, pulmonary fibrosis with rheumatoid arthritis  CHEST - 2 VIEW  Comparison: November 16, 2011  Findings: Cardiomegaly is unchanged.  The mediastinum pulmonary vasculature are within normal limits.  Interstitial fibrotic disease remains present and not significantly changed.  No focal infiltrates are appreciated.  No effusions.  IMPRESSION: No significant change in appearance of cardiomegaly and interstitial fibrotic disease.  No acute abnormality.  Original Report Authenticated By: Brandon Melnick, M.D.        Assessment & Plan:    PULMONARY FIBROSIS, POSTINFLAMMATORY Pulmonary  fibrosis on the basis of autoimmune process due to  rheumatoid arthritis stable at this time. Recent right lower lobe pneumonia community acquired now resolved on current chest x-ray  Plan Additional antibiotics are necessary Would maintain Arava 10 mg daily      Updated Medication List Outpatient Encounter Prescriptions as of 12/31/2011  Medication Sig Dispense Refill  . Abatacept (ORENCIA IV) Inject into the vein. Every 4 weeks       . dipyridamole-aspirin (AGGRENOX) 25-200 MG per 12 hr capsule Take 1 capsule by mouth 2 (two) times daily.       . furosemide (LASIX) 40 MG tablet Take 1 tablet (40 mg total) by mouth daily.  30 tablet  0  . glipiZIDE (GLUCOTROL XL) 2.5 MG 24 hr tablet Take 2.5 mg by mouth daily.        Marland Kitchen leflunomide (ARAVA) 20 MG tablet Take 10 mg by mouth daily.       Marland Kitchen loperamide (IMODIUM A-D) 2 MG tablet Take 2 mg by mouth as needed. For diaherra      . metoprolol (TOPROL-XL) 50 MG 24 hr tablet Take 50 mg by mouth 2 (two) times daily.        . Multiple Vitamins-Minerals (CENTRUM SILVER PO) Take 1 tablet by mouth daily.       . nitroGLYCERIN (NITROSTAT) 0.4 MG SL tablet Place 0.4 mg under the tongue every 5 (five) minutes as needed. For chest pain       . Omega-3 Fatty Acids (FISH OIL) 1200 MG CAPS Take 1 capsule by mouth daily.       . rosuvastatin (CRESTOR) 40 MG tablet Take 1 tablet (40 mg total) by mouth daily.  30 tablet  11  . SYMBICORT 160-4.5 MCG/ACT inhaler INHALE TWO (2) PUFF(S) BY MOUTH TW ICE DAILY ** RINSE MOUTH AF TER EACH USE  10.2 g  3

## 2011-12-31 NOTE — Patient Instructions (Signed)
No change in medications Chest xray today Return 2 months

## 2012-01-01 NOTE — Assessment & Plan Note (Addendum)
Pulmonary  fibrosis on the basis of autoimmune process due to rheumatoid arthritis stable at this time. Recent right lower lobe pneumonia community acquired now resolved on current chest x-ray  Plan Additional antibiotics are necessary Would maintain Arava 10 mg daily

## 2012-01-29 IMAGING — CR DG HIP COMPLETE 2+V*R*
3 series · 3 of 3 positions shown · non-contrast
Comparison: None.

CLINICAL DATA: Right hip pain

RIGHT HIP - COMPLETE 2+ VIEW

[t pelvis a.p.]
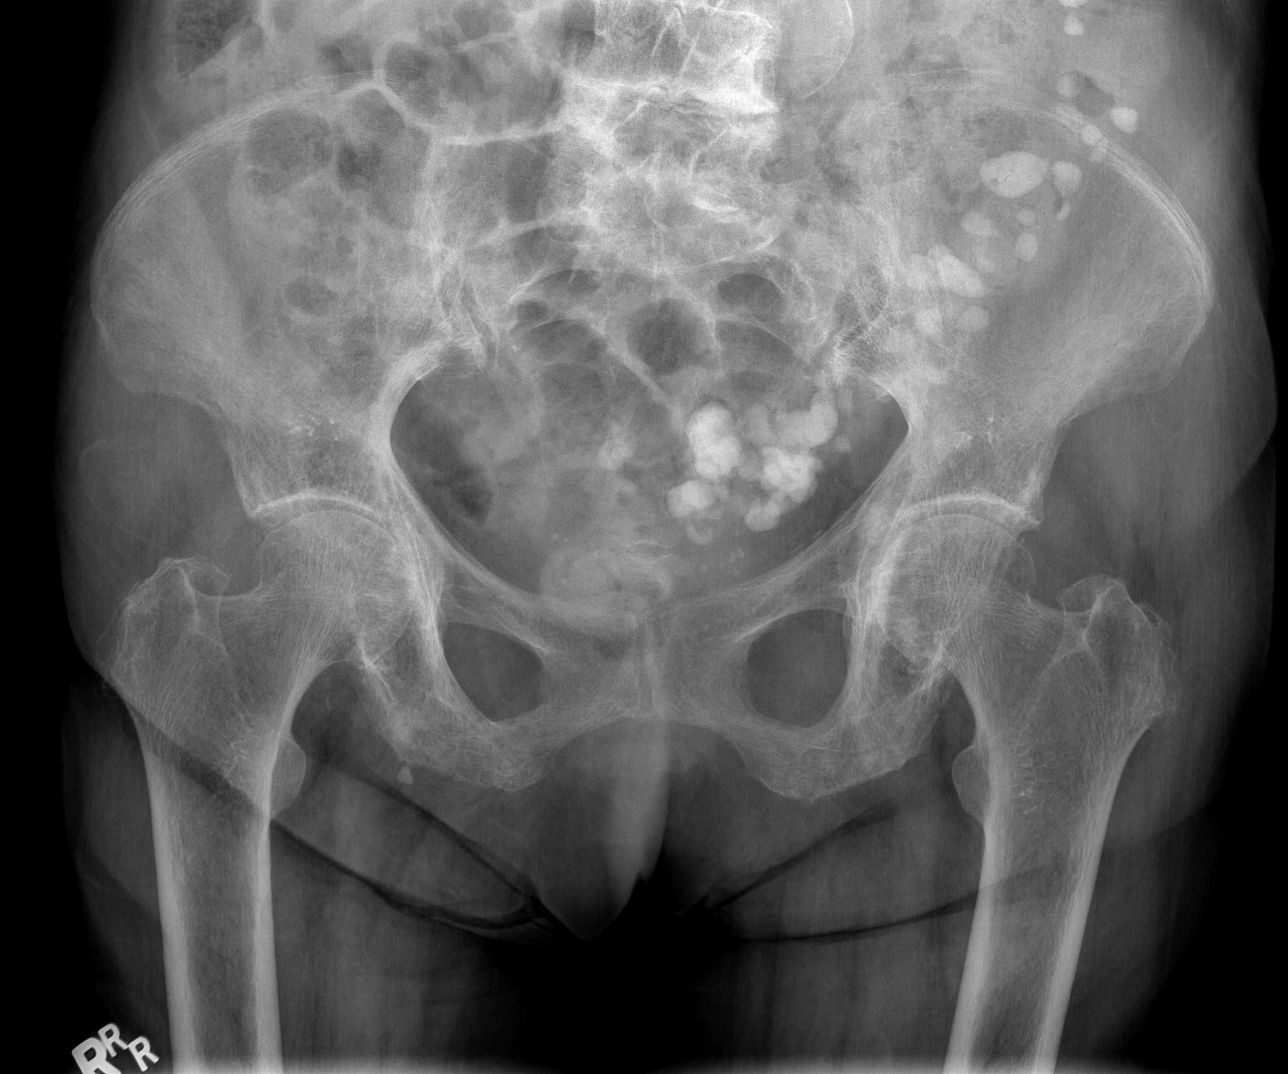

[t hip ap right]
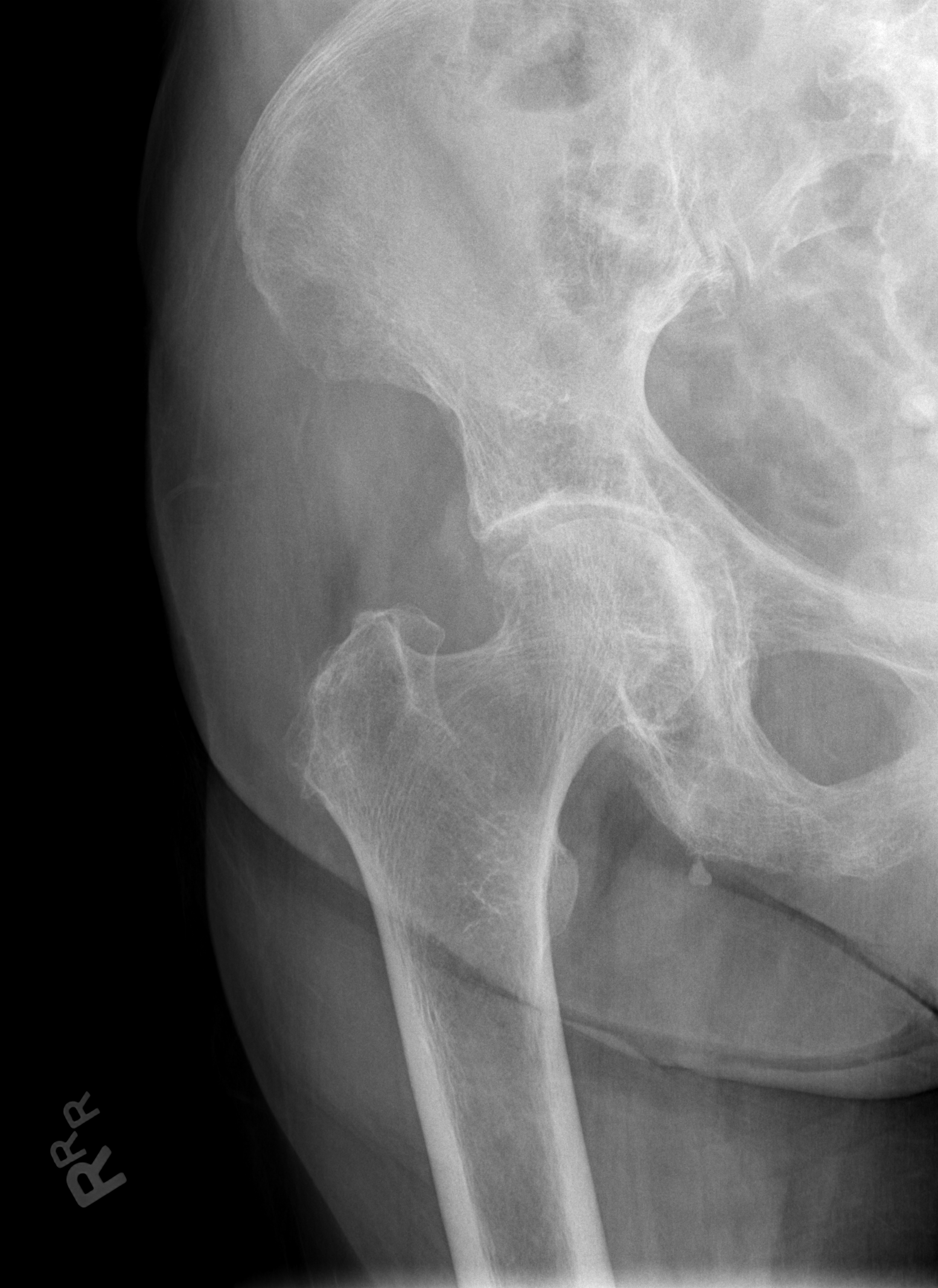

[t hip frog leg right]
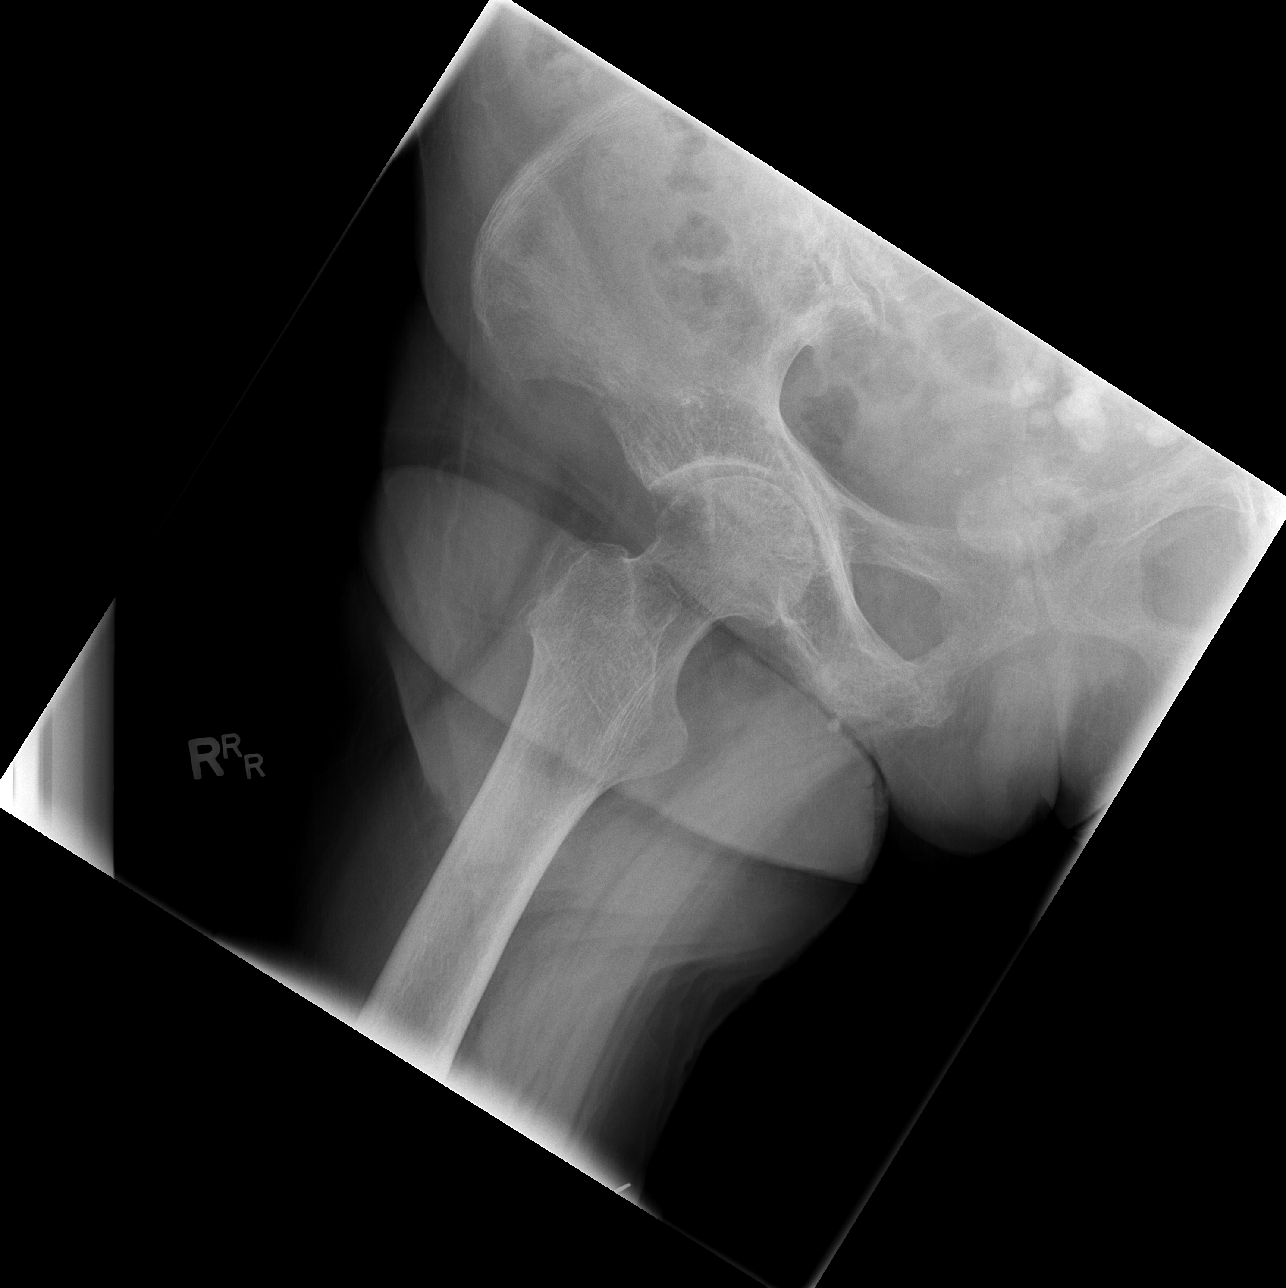

[3 of 3 positions shown; findings below may reference images not displayed]

FINDINGS: Hips are located.  Dedicated view of the right hip
demonstrates no femoral neck fracture.  There is medial joint space
narrowing on the left and right.  Osteopenia noted.  No pelvic
fracture.
IMPRESSION: 1.  No evidence right hip fracture.

2. No evidence of pelvic fracture.
3.  Moderate osteoarthritis of the hip joints.

## 2012-03-10 IMAGING — CR DG CHEST 1V PORT
1 series · 1 of 1 positions shown · non-contrast
Comparison: 05/04/2011

CLINICAL DATA: Chest pain, cough, and shortness of breath.  History
of pulmonary fibrosis.

PORTABLE CHEST - 1 VIEW

[view not recorded]
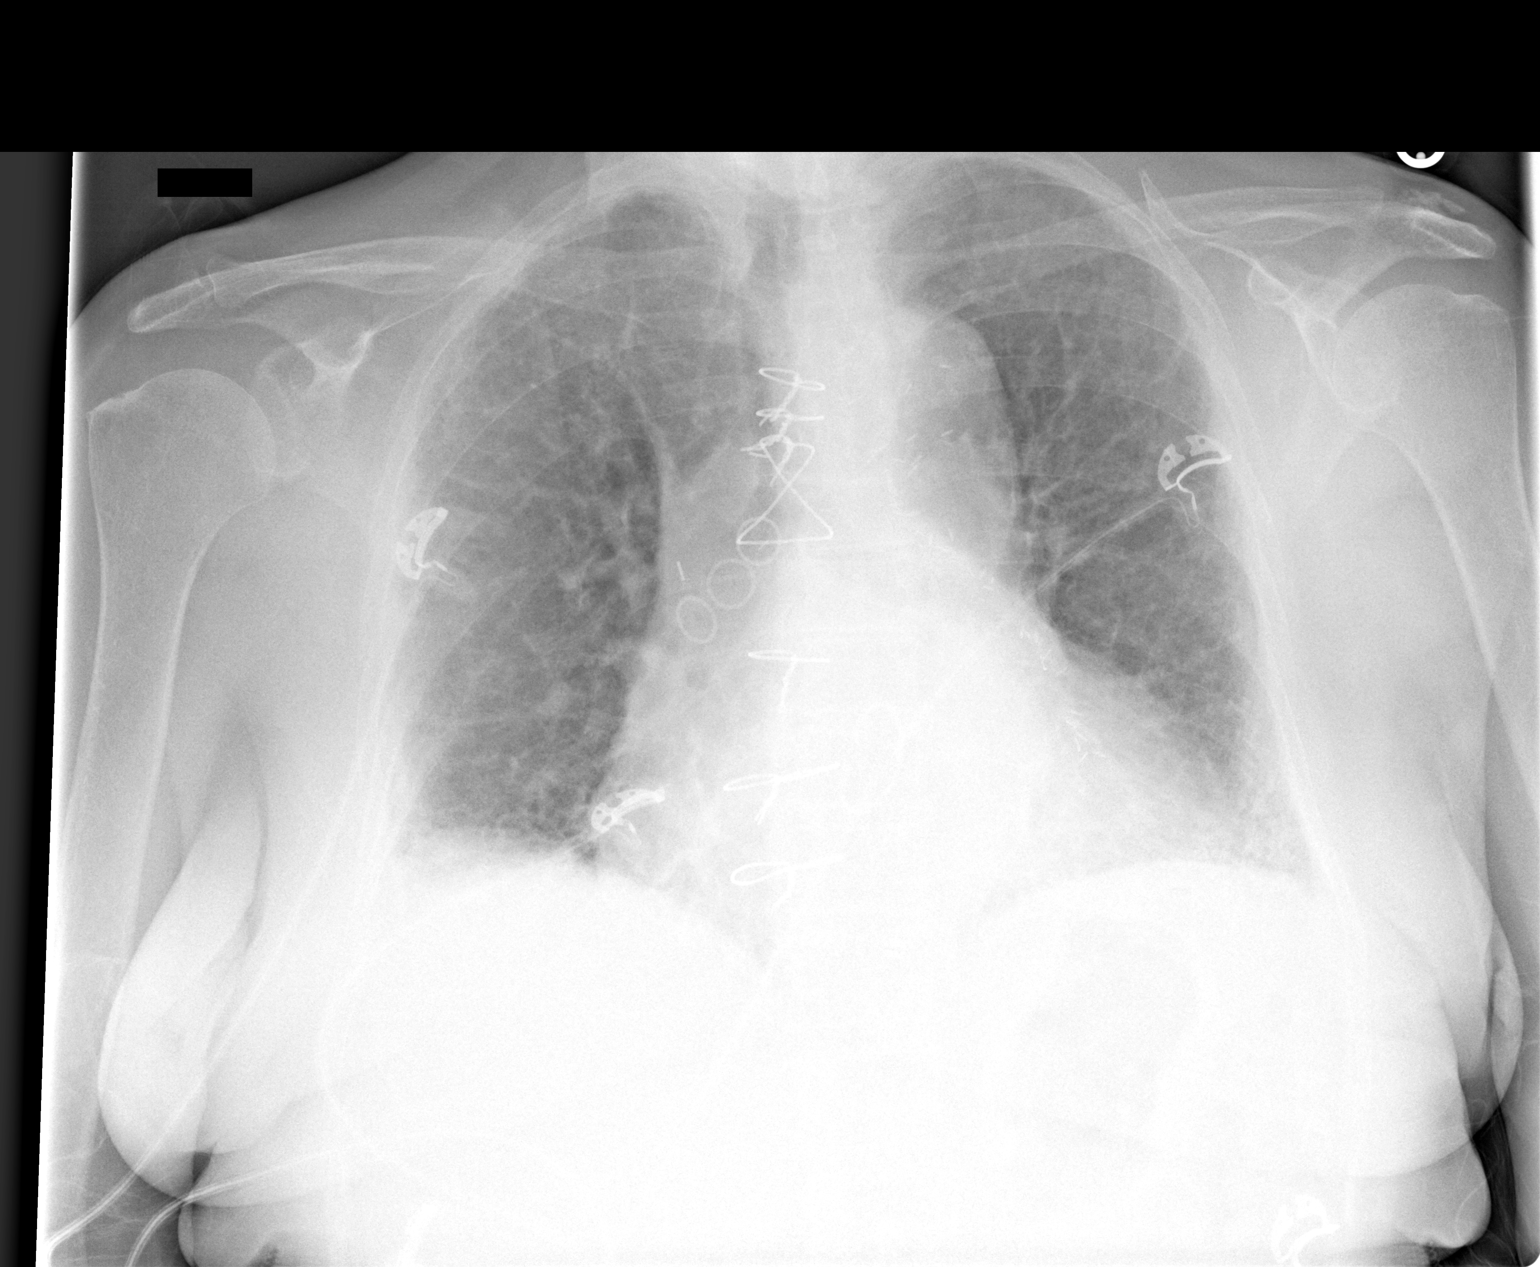

[1 of 1 positions shown; findings below may reference images not displayed]

FINDINGS: Chronic mild cardiomegaly.  Evidence of prosthetic mitral
valve and CABG.  Vascularity is normal.  Chronic accentuation of
the interstitial markings consistent with pulmonary fibrosis.  No
effusions.
IMPRESSION: No acute abnormality.  Chronic heart and lung disease.

## 2012-05-05 ENCOUNTER — Encounter: Payer: Self-pay | Admitting: Critical Care Medicine

## 2012-05-05 ENCOUNTER — Ambulatory Visit (INDEPENDENT_AMBULATORY_CARE_PROVIDER_SITE_OTHER): Payer: Medicare Other | Admitting: Critical Care Medicine

## 2012-05-05 VITALS — BP 112/80 | HR 70 | Temp 98.1°F | Ht 59.0 in | Wt 131.0 lb

## 2012-05-05 DIAGNOSIS — J8410 Pulmonary fibrosis, unspecified: Secondary | ICD-10-CM

## 2012-05-05 NOTE — Patient Instructions (Addendum)
Stay on symbicort No other medication changes Return 4 months

## 2012-05-05 NOTE — Progress Notes (Signed)
Subjective:    Patient ID: Briana Ray, female    DOB: 08/05/1924, 78 y.o.   MRN: [id number]  HPI  78 y.o.   WF with interstital changes on CT chest in setting of RA. Has RA induced interstitial lung disease.  Past Hx pertinent for severe Rheumatoid arthritis Dx 67yrs ago. Rx with Leflunamide. Not on methotrexate.   12/31/2011 Pt admitted 11/16/11 for CAP. D/c on 10days avelox.   Now cough is better, only now 1-2 times daily.  Occ coughs when eating.  Pt notes dysphagia more. Now dyspnea is better. Pt denies any significant sore throat, nasal congestion or excess secretions, fever, chills, sweats, unintended weight loss, pleurtic or exertional chest pain, orthopnea PND, or leg swelling Pt denies any increase in rescue therapy over baseline, denies waking up needing it or having any early am or nocturnal exacerbations of coughing/wheezing/or dyspnea. Pt also denies any obvious fluctuation in symptoms with  weather or environmental change or other alleviating or aggravating factors  05/05/2012 Doing ok for now.  Pt uses oxygen at night and symbicort daily. Pt with daily dry cough, occurs anytime. No real wheeze or chest pain.  Arthritis is a major issue.    Past Medical History  Diagnosis Date  . Coronary heart disease   . CVA (cerebral infarction) 04/25/10    MRI L parietal lobe infarction   . Hyperlipidemia   . Diabetes mellitus, type 2   . GERD (gastroesophageal reflux disease)   . 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 Rheumatoid arthritis     on Arava. Dx for 20 yrs  . Aneurysm     "on my heart; dr said I didn't have to worry about it, it's not big enough yet"  . Hypertension   . Myocardial infarction 1998; 2005  . Pneumonia 11/16/11    "first time ever"  . Anemia   . Blood transfusion   . Stroke      Family History  Problem Relation Age of Onset  . Heart disease Mother   . Heart disease Brother     x 5  . Heart disease  Sister     x 5  . Heart disease Father      History   Social History  . Marital Status: Widowed    Spouse Name: N/A    Number of Children: N/A  . Years of Education: N/A   Occupational History  . Retired     [employer]   Social History Main Topics  . Smoking status: Never Smoker   . Smokeless tobacco: Never Used  . Alcohol Use: No     rare  . Drug Use: No  . Sexually Active: Not Currently   Other Topics Concern  . Not on file   Social History Narrative  . No narrative on file     Allergies  Allergen Reactions  . Amoxicillin-Pot Clavulanate     REACTION: diarrhea     Outpatient Prescriptions Prior to Visit  Medication Sig Dispense Refill  . dipyridamole-aspirin (AGGRENOX) 25-200 MG per 12 hr capsule Take 1 capsule by mouth 2 (two) times daily.       Marland Kitchen glipiZIDE (GLUCOTROL XL) 2.5 MG 24 hr tablet Take 2.5 mg by mouth daily.        Marland Kitchen leflunomide (ARAVA) 20 MG tablet Take 10 mg by mouth daily.       Marland Kitchen loperamide (IMODIUM A-D) 2 MG tablet Take 2  mg by mouth as needed. For diaherra      . metoprolol (TOPROL-XL) 50 MG 24 hr tablet Take 50 mg by mouth 2 (two) times daily.        . Multiple Vitamins-Minerals (CENTRUM SILVER PO) Take 1 tablet by mouth daily.       . nitroGLYCERIN (NITROSTAT) 0.4 MG SL tablet Place 0.4 mg under the tongue every 5 (five) minutes as needed. For chest pain       . Omega-3 Fatty Acids (FISH OIL) 1200 MG CAPS Take 1 capsule by mouth daily.       . SYMBICORT 160-4.5 MCG/ACT inhaler INHALE TWO (2) PUFF(S) BY MOUTH TW ICE DAILY ** RINSE MOUTH AF TER EACH USE  10.2 g  3  . Abatacept (ORENCIA IV) Inject into the vein. Every 4 weeks       . furosemide (LASIX) 40 MG tablet Take 1 tablet (40 mg total) by mouth daily.  30 tablet  0  . rosuvastatin (CRESTOR) 40 MG tablet Take 1 tablet (40 mg total) by mouth daily.  30 tablet  11     Review of Systems  Constitutional:   No  weight loss, night sweats,  Fevers, chills, fatigue, lassitude.  HEENT:    No headaches,  Difficulty swallowing,  Tooth/dental problems,  Sore throat,                No sneezing, itching, ear ache, nasal congestion, post nasal drip,   CV:  No chest pain,  Orthopnea, PND, swelling in lower extremities, anasarca, dizziness, palpitations  GI  No heartburn, indigestion,  vomiting, diarrhea,   loss of appetite  Resp: Notes  shortness of breath with exertion not  at rest.  No excess mucus, no  productive cough,  Notes  non-productive cough,  No coughing up of blood.  No change in color of mucus.  No wheezing.  No chest wall deformity  Skin: no rash or lesions.  GU: no dysuria, change in color of urine, no urgency or frequency.  No flank pain.  MS:  +chronic  joint pain     Psych:  No change in mood or affect. No depression or anxiety.  No memory loss.     Objective:   Physical Exam  Filed Vitals:   05/05/12 1407  BP: 112/80  Pulse: 70  Temp: 98.1 F (36.7 C)  TempSrc: Oral  Height: 4\' 11"  (1.499 m)  Weight: 131 lb (59.421 kg)  SpO2: 96%    Gen: Pleasant, well-nourished, in no distress,  normal affect  ENT: No lesions,  mouth clear,  oropharynx clear, no postnasal drip  Neck: No JVD, no TMG, no carotid bruits  Lungs: No use of accessory muscles, no dullness to percussion, dry rales,   Cardiovascular: RRR, heart sounds normal, no murmur or gallops, no peripheral edema  Abdomen: soft and NT, no HSM,  BS normal  Musculoskeletal: No deformities, no cyanosis or clubbing  Neuro: alert, non focal  Skin: Warm, no lesions or rashes  No results found.      Assessment & Plan:    PULMONARY FIBROSIS, POSTINFLAMMATORY Pulmonary fibrosis on the basis of rheumatoid arthritis with peripheral airflow obstruction. Plan Maintain inhaled medications as prescribed Maintain nocturnal oxygen therapy Return 4 months    Updated Medication List Outpatient Encounter Prescriptions as of 05/05/2012  Medication Sig Dispense Refill  . atorvastatin (LIPITOR) 10  MG tablet Take 20 mg by mouth daily.       Marland Kitchen dipyridamole-aspirin (AGGRENOX) 25-200  MG per 12 hr capsule Take 1 capsule by mouth 2 (two) times daily.       Marland Kitchen glipiZIDE (GLUCOTROL XL) 2.5 MG 24 hr tablet Take 2.5 mg by mouth daily.        . hydroxychloroquine (PLAQUENIL) 200 MG tablet Take 300 mg by mouth daily.      Marland Kitchen leflunomide (ARAVA) 20 MG tablet Take 10 mg by mouth daily.       Marland Kitchen loperamide (IMODIUM A-D) 2 MG tablet Take 2 mg by mouth as needed. For diaherra      . metoprolol (TOPROL-XL) 50 MG 24 hr tablet Take 50 mg by mouth 2 (two) times daily.        . Multiple Vitamins-Minerals (CENTRUM SILVER PO) Take 1 tablet by mouth daily.       . nitroGLYCERIN (NITROSTAT) 0.4 MG SL tablet Place 0.4 mg under the tongue every 5 (five) minutes as needed. For chest pain       . Omega-3 Fatty Acids (FISH OIL) 1200 MG CAPS Take 1 capsule by mouth daily.       . SYMBICORT 160-4.5 MCG/ACT inhaler INHALE TWO (2) PUFF(S) BY MOUTH TW ICE DAILY ** RINSE MOUTH AF TER EACH USE  10.2 g  3  . DISCONTD: Abatacept (ORENCIA IV) Inject into the vein. Every 4 weeks       . DISCONTD: furosemide (LASIX) 40 MG tablet Take 1 tablet (40 mg total) by mouth daily.  30 tablet  0  . DISCONTD: rosuvastatin (CRESTOR) 40 MG tablet Take 1 tablet (40 mg total) by mouth daily.  30 tablet  11

## 2012-05-06 NOTE — Assessment & Plan Note (Signed)
Pulmonary fibrosis on the basis of rheumatoid arthritis with peripheral airflow obstruction. Plan Maintain inhaled medications as prescribed Maintain nocturnal oxygen therapy Return 4 months

## 2012-07-30 ENCOUNTER — Other Ambulatory Visit: Payer: Self-pay | Admitting: Critical Care Medicine

## 2012-08-10 ENCOUNTER — Encounter: Payer: Self-pay | Admitting: Cardiology

## 2012-08-10 ENCOUNTER — Ambulatory Visit (INDEPENDENT_AMBULATORY_CARE_PROVIDER_SITE_OTHER): Payer: Medicare Other | Admitting: Cardiology

## 2012-08-10 VITALS — BP 110/80 | HR 60 | Ht 59.0 in | Wt 128.0 lb

## 2012-08-10 DIAGNOSIS — R0989 Other specified symptoms and signs involving the circulatory and respiratory systems: Secondary | ICD-10-CM

## 2012-08-10 DIAGNOSIS — I2510 Atherosclerotic heart disease of native coronary artery without angina pectoris: Secondary | ICD-10-CM

## 2012-08-10 DIAGNOSIS — I10 Essential (primary) hypertension: Secondary | ICD-10-CM

## 2012-08-10 MED ORDER — ASPIRIN-DIPYRIDAMOLE ER 25-200 MG PO CP12: 1.0000 | ORAL_CAPSULE | Freq: Two times a day (BID) | ORAL | Status: DC

## 2012-08-10 NOTE — Assessment & Plan Note (Addendum)
Plan followup abdominal ultrasound to reassess.

## 2012-08-10 NOTE — Assessment & Plan Note (Signed)
Continue aspirin and statin. Patient with vague chest pain. Schedule Myoview for risk stratification.

## 2012-08-10 NOTE — Patient Instructions (Addendum)
Your physician wants you to follow-up in: ONE YEAR WITH DR Jens Som IN [place] You will receive a reminder letter in the mail two months in advance. If you don't receive a letter, please call our office to schedule the follow-up appointment.   Your physician has requested that you have a lexiscan myoview. For further information please visit [email or web address] Please follow instruction sheet, as given.   Your physician has requested that you have an abdominal aorta duplex. During this test, an ultrasound is used to evaluate the aorta. Allow 30 minutes for this exam. Do not eat after midnight the day before and avoid carbonated beverages   Your physician recommends that you HAVE LAB WORK TODAY

## 2012-08-10 NOTE — Progress Notes (Signed)
HPI: Pleasant female for fu of coronary artery disease. Status post coronary artery bypass graft and valve repair in 1998. Patient had a LIMA to the LAD, saphenous vein graft to PDA, saphenous vein graft to the intermediate and saphenous vein graft to the marginal. Last cardiac catheterization was performed in 2005 and revealed severe three-vessel obstructive coronary artery disease. Patent saphenous vein graft to the posterior descending artery but with high-grade stenosis at the anastomosis site. Patent left internal mammary artery graft to the left anterior descending. Two occluded vein grafts presumably to intermediate and marginal vessels. Mild left ventricular dysfunction. Successful stenting of the saphenous vein graft to the posterior descending artery at the distal anastomosis. Note abdominal CT in March of 2012 showed a 3.8 x 3.5 cm abdominal aortic aneurysm. There is also moderate to marked narrowing of the left iliac. Patient admitted in November of 2012 with chest pain felt to be musculoskeletal. Enzymes negative. Outpatient Myoview in November of 2012 revealed mild scar in the apical lateral segment but no ischemia. Ejection fraction was 55%. Since I last saw her in Dec 2012, she does have dyspnea on exertion which is unchanged and felt secondary to pulmonary fibrosis. There is no orthopnea, PND, pedal edema; she occasionally feels a tightness in her left chest which has been intermittent for years. It resolved spontaneously. She does not have exertional chest pain.   Current Outpatient Prescriptions  Medication Sig Dispense Refill  . atorvastatin (LIPITOR) 10 MG tablet Take 10 mg by mouth daily.       Marland Kitchen glipiZIDE (GLUCOTROL XL) 2.5 MG 24 hr tablet Take 2.5 mg by mouth daily.        . hydroxychloroquine (PLAQUENIL) 200 MG tablet Take 300 mg by mouth daily.      Marland Kitchen leflunomide (ARAVA) 20 MG tablet Take 10 mg by mouth daily.       Marland Kitchen loperamide (IMODIUM A-D) 2 MG tablet Take 2 mg by mouth as  needed. For diaherra      . metoprolol (TOPROL-XL) 50 MG 24 hr tablet Take 50 mg by mouth 2 (two) times daily.        . Multiple Vitamins-Minerals (CENTRUM SILVER PO) Take 1 tablet by mouth daily.       . nitroGLYCERIN (NITROSTAT) 0.4 MG SL tablet Place 0.4 mg under the tongue every 5 (five) minutes as needed. For chest pain       . Omega-3 Fatty Acids (FISH OIL) 1200 MG CAPS Take 1 capsule by mouth daily.       . SYMBICORT 160-4.5 MCG/ACT inhaler INHALE TWO (2) PUFF(S) BY MOUTH TWICE DAILY ** RINSE MOUTH AF TER EACH USE  1 Inhaler  5     Past Medical History  Diagnosis Date  . Coronary heart disease   . CVA (cerebral infarction) 04/25/10    MRI L parietal lobe infarction   . Hyperlipidemia   . Diabetes mellitus, type 2   . GERD (gastroesophageal reflux disease)   . 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 Rheumatoid arthritis     on Arava. Dx for 20 yrs  . Aneurysm     "on my heart; dr said I didn't have to worry about it, it's not big enough yet"  . Hypertension   . Myocardial infarction 1998; 2005  . Pneumonia 11/16/11    "first time ever"  . Anemia   . Blood transfusion   . Stroke  Past Surgical History  Procedure Date  . Breast reduction surgery   . Cholecystectomy   . Appendectomy   . Tonsillectomy   . Vesicovaginal fistula closure w/ tah   . Carpal tunnel release     left  . Coronary angioplasty with stent placement ~ 2002    "1"  . Hernia repair     umbilical  . Abdominal hysterectomy   . Cataract extraction w/ intraocular lens  implant, bilateral   . Coronary artery bypass graft 1998    vCABG X4 w/alve repair    History   Social History  . Marital Status: Widowed    Spouse Name: N/A    Number of Children: N/A  . Years of Education: N/A   Occupational History  . Retired     [employer]   Social History Main Topics  . Smoking status: Never Smoker   . Smokeless tobacco: Never Used  . Alcohol Use: No      Comment: rare  . Drug Use: No  . Sexually Active: Not Currently   Other Topics Concern  . Not on file   Social History Narrative  . No narrative on file    ROS: no fevers or chills, productive cough, hemoptysis, dysphasia, odynophagia, melena, hematochezia, dysuria, hematuria, rash, seizure activity, orthopnea, PND, pedal edema, claudication. Remaining systems are negative.  Physical Exam: Well-developed well-nourished in no acute distress.  Skin is warm and dry.  HEENT is normal.  Neck is supple.  Chest is clear to auscultation with normal expansion. Previous sternotomy Cardiovascular exam is regular rate and rhythm. 2/6 systolic ejection murmur Abdominal exam nontender or distended. No masses palpated. Extremities show no edema. neuro grossly intact  ECG Sinus rhythm, incomplete RBBB

## 2012-08-10 NOTE — Assessment & Plan Note (Signed)
Continue statin. Check lipids and liver. 

## 2012-08-10 NOTE — Assessment & Plan Note (Signed)
Blood pressure controlled. Continue present medications. 

## 2012-08-11 ENCOUNTER — Encounter: Payer: Self-pay | Admitting: *Deleted

## 2012-08-11 LAB — HEPATIC FUNCTION PANEL
ALT: 27 U/L (ref 0–35)
AST: 33 U/L (ref 0–37)
Bilirubin, Direct: 0.1 mg/dL (ref 0.0–0.3)
Total Protein: 7 g/dL (ref 6.0–8.3)

## 2012-08-11 LAB — LIPID PANEL
Cholesterol: 163 mg/dL (ref 0–200)
Triglycerides: 181 mg/dL — ABNORMAL HIGH (ref <150)
VLDL: 36 mg/dL (ref 0–40)

## 2012-08-15 ENCOUNTER — Telehealth: Payer: Self-pay

## 2012-08-15 MED ORDER — NITROGLYCERIN 0.4 MG SL SUBL: 0.4000 mg | SUBLINGUAL_TABLET | SUBLINGUAL | Status: DC | PRN

## 2012-08-17 ENCOUNTER — Other Ambulatory Visit: Payer: Self-pay | Admitting: *Deleted

## 2012-08-17 MED ORDER — NITROGLYCERIN 0.4 MG SL SUBL: 0.4000 mg | SUBLINGUAL_TABLET | SUBLINGUAL | Status: DC | PRN

## 2012-08-17 NOTE — Telephone Encounter (Signed)
New Problem:    Patient called in needing a refill of her nitroGLYCERIN (NITROSTAT) 0.4 MG SL tablet [address] phone: [phone].

## 2012-08-17 NOTE — Telephone Encounter (Signed)
Called pharmacy because it did not go through [facility] gave pt 12 refills

## 2012-08-18 ENCOUNTER — Other Ambulatory Visit: Payer: Self-pay

## 2012-08-18 MED ORDER — NITROGLYCERIN 0.4 MG SL SUBL: 0.4000 mg | SUBLINGUAL_TABLET | SUBLINGUAL | Status: AC | PRN

## 2012-09-07 ENCOUNTER — Ambulatory Visit (HOSPITAL_COMMUNITY): Payer: Medicare Other | Attending: Cardiology | Admitting: Radiology

## 2012-09-07 VITALS — BP 122/80 | Ht 59.0 in | Wt 126.0 lb

## 2012-09-07 DIAGNOSIS — R079 Chest pain, unspecified: Secondary | ICD-10-CM | POA: Insufficient documentation

## 2012-09-07 DIAGNOSIS — R000 Tachycardia, unspecified: Secondary | ICD-10-CM | POA: Insufficient documentation

## 2012-09-07 DIAGNOSIS — I714 Abdominal aortic aneurysm, without rupture, unspecified: Secondary | ICD-10-CM | POA: Insufficient documentation

## 2012-09-07 DIAGNOSIS — Z951 Presence of aortocoronary bypass graft: Secondary | ICD-10-CM | POA: Insufficient documentation

## 2012-09-07 DIAGNOSIS — R0989 Other specified symptoms and signs involving the circulatory and respiratory systems: Secondary | ICD-10-CM | POA: Insufficient documentation

## 2012-09-07 DIAGNOSIS — R5381 Other malaise: Secondary | ICD-10-CM | POA: Insufficient documentation

## 2012-09-07 DIAGNOSIS — R0602 Shortness of breath: Secondary | ICD-10-CM | POA: Insufficient documentation

## 2012-09-07 DIAGNOSIS — Z9861 Coronary angioplasty status: Secondary | ICD-10-CM | POA: Insufficient documentation

## 2012-09-07 DIAGNOSIS — R0609 Other forms of dyspnea: Secondary | ICD-10-CM | POA: Insufficient documentation

## 2012-09-07 DIAGNOSIS — E119 Type 2 diabetes mellitus without complications: Secondary | ICD-10-CM | POA: Insufficient documentation

## 2012-09-07 DIAGNOSIS — R002 Palpitations: Secondary | ICD-10-CM | POA: Insufficient documentation

## 2012-09-07 DIAGNOSIS — I739 Peripheral vascular disease, unspecified: Secondary | ICD-10-CM | POA: Insufficient documentation

## 2012-09-07 DIAGNOSIS — Z8673 Personal history of transient ischemic attack (TIA), and cerebral infarction without residual deficits: Secondary | ICD-10-CM | POA: Insufficient documentation

## 2012-09-07 DIAGNOSIS — I2510 Atherosclerotic heart disease of native coronary artery without angina pectoris: Secondary | ICD-10-CM

## 2012-09-07 DIAGNOSIS — I10 Essential (primary) hypertension: Secondary | ICD-10-CM | POA: Insufficient documentation

## 2012-09-07 DIAGNOSIS — E785 Hyperlipidemia, unspecified: Secondary | ICD-10-CM | POA: Insufficient documentation

## 2012-09-07 MED ORDER — TECHNETIUM TC 99M SESTAMIBI GENERIC - CARDIOLITE
30.0000 | Freq: Once | INTRAVENOUS | Status: AC | PRN
  Administered 2012-09-07: 30 via INTRAVENOUS

## 2012-09-07 MED ORDER — TECHNETIUM TC 99M SESTAMIBI GENERIC - CARDIOLITE
10.0000 | Freq: Once | INTRAVENOUS | Status: AC | PRN
  Administered 2012-09-07: 10 via INTRAVENOUS

## 2012-09-07 MED ORDER — REGADENOSON 0.4 MG/5ML IV SOLN
0.4000 mg | Freq: Once | INTRAVENOUS | Status: AC
  Administered 2012-09-07: 0.4 mg via INTRAVENOUS

## 2012-09-07 NOTE — Progress Notes (Addendum)
[facility] SITE 3 NUCLEAR MED [address] [id number] [place] [address] [phone]  Cardiology Nuclear Med Study  Briana Ray is a 78 y.o. female     MRN : [id number]     DOB: 09-14-1924  Procedure Date: 09/07/2012  Nuclear Med Background Indication for Stress Test:  Evaluation for Ischemia, Graft Patency, Stent Patency and PTCA Patency History:  2012 MPS- EF 55% Normal, 2011 Echo- EF 55-60%, 2005- MI, PTCA Stent SVG-PDA, 1998 CABG MVR,MI  Cardiac Risk Factors: CVA, Hypertension, Lipids, NIDDM, PVD and AAA  Symptoms:  Chest Pain, DOE, Fatigue, Palpitations, Rapid HR and SOB   Nuclear Pre-Procedure Caffeine/Decaff Intake:  None NPO After: 4 pm   Lungs:  clear O2 Sat: 96% on room air. IV 0.9% NS with Angio Cath:  22g  IV Site: R Antecubital  IV Started by:  Milana Na, EMT-P  Chest Size (in):  34 Cup Size: C  Height: 4\' 11"  (1.499 m)  Weight:  126 lb (57.153 kg)  BMI:  Body mass index is 25.45 kg/(m^2). Tech Comments: No Diabetic Rx this am CBG 134 mg/dl    Nuclear Med Study 1 or 2 day study: 1 day  Stress Test Type:  Eugenie Birks  Reading MD: Kristeen Miss, MD  Order Authorizing Provider:  B.Crenshaw MD  Resting Radionuclide: Technetium 39m Sestamibi  Resting Radionuclide Dose: 10.7 mCi   Stress Radionuclide:  Technetium 2m Sestamibi  Stress Radionuclide Dose: 33.0 mCi           Stress Protocol Rest HR: 59 Stress HR: 77  Rest BP: 122/80 Stress BP: 130/82  Exercise Time (min): n/a METS: n/a   Predicted Max HR: 134 bpm % Max HR: 57.46 bpm Rate Pressure Product: 62952   Dose of Adenosine (mg):  n/a Dose of Lexiscan: 0.4 mg  Dose of Atropine (mg): n/a Dose of Dobutamine: n/a mcg/kg/min (at max HR)  Stress Test Technologist: Bonnita Levan, RN  Nuclear Technologist:  Doyne Keel, CNMT     Rest Procedure:  Myocardial perfusion imaging was performed at rest 45 minutes following the intravenous administration of Technetium 27m  Tetrofosmin. Rest ECG: NSR with non-specific ST-T wave changes  Stress Procedure:  The patient received IV Lexiscan 0.4 mg over 15-seconds.  Technetium 5m Tetrofosmin injected at 30-seconds. Quantitative spect images were obtained after a 45 minute delay. Stress ECG: No significant change from baseline ECG  QPS Raw Data Images:  There is interference from nuclear activity from structures below the diaphragm. This does not affect the ability to read the study.  There is also breast attenuation of the lateral aspect of the LV. Stress Images:  There is a medium sized mild defect in the lateral wall with normal uptake in the remaining walls.  Rest Images:  There is a medium sized mild defect in the lateral wall with normal uptake in the remaining walls Subtraction (SDS):  No evidence of ischemia.  There is a fixed lateral defect that could represent a previoius lateral MI vs. Breast artifact. Transient Ischemic Dilatation (Normal <1.22):  1.03 Lung/Heart Ratio (Normal <0.45):  0.39  Quantitative Gated Spect Images QGS EDV:  58 ml QGS ESV:  23 ml  Impression Exercise Capacity:  Lexiscan with no exercise. BP Response:  Normal blood pressure response. Clinical Symptoms:  No significant symptoms noted. ECG Impression:  No significant ST segment change suggestive of ischemia. Comparison with Prior Nuclear Study:  No changes from previous study 08/10/11.   Overall Impression:  Low risk  stress nuclear study.  There is evidence of a previous lateral MI.  There is no evidence of ischemia.   LV Ejection Fraction: 60%.  LV Wall Motion:  The overall LV function is normal.  There is mild hypokinesis of the lateral basal segments.

## 2012-09-09 ENCOUNTER — Encounter (INDEPENDENT_AMBULATORY_CARE_PROVIDER_SITE_OTHER): Payer: Medicare Other

## 2012-09-09 DIAGNOSIS — I714 Abdominal aortic aneurysm, without rupture: Secondary | ICD-10-CM

## 2012-09-09 DIAGNOSIS — I2510 Atherosclerotic heart disease of native coronary artery without angina pectoris: Secondary | ICD-10-CM

## 2012-09-09 DIAGNOSIS — R0989 Other specified symptoms and signs involving the circulatory and respiratory systems: Secondary | ICD-10-CM

## 2012-09-12 ENCOUNTER — Encounter (HOSPITAL_COMMUNITY): Payer: Medicare Other

## 2012-09-19 ENCOUNTER — Encounter (HOSPITAL_COMMUNITY): Payer: Medicare Other

## 2012-10-03 ENCOUNTER — Other Ambulatory Visit: Payer: Self-pay | Admitting: Cardiology

## 2012-10-29 ENCOUNTER — Other Ambulatory Visit: Payer: Self-pay | Admitting: Cardiology

## 2013-01-17 ENCOUNTER — Emergency Department (HOSPITAL_BASED_OUTPATIENT_CLINIC_OR_DEPARTMENT_OTHER): Payer: Medicare Other

## 2013-01-17 ENCOUNTER — Encounter (HOSPITAL_BASED_OUTPATIENT_CLINIC_OR_DEPARTMENT_OTHER): Payer: Self-pay | Admitting: *Deleted

## 2013-01-17 ENCOUNTER — Emergency Department (HOSPITAL_BASED_OUTPATIENT_CLINIC_OR_DEPARTMENT_OTHER)
Admission: EM | Admit: 2013-01-17 | Discharge: 2013-01-17 | Disposition: A | Discharge status: Home / Self Care | Payer: Medicare Other | Attending: Emergency Medicine | Admitting: Emergency Medicine

## 2013-01-17 DIAGNOSIS — Z8709 Personal history of other diseases of the respiratory system: Secondary | ICD-10-CM | POA: Insufficient documentation

## 2013-01-17 DIAGNOSIS — E119 Type 2 diabetes mellitus without complications: Secondary | ICD-10-CM | POA: Insufficient documentation

## 2013-01-17 DIAGNOSIS — S9030XA Contusion of unspecified foot, initial encounter: Secondary | ICD-10-CM | POA: Insufficient documentation

## 2013-01-17 DIAGNOSIS — Z79899 Other long term (current) drug therapy: Secondary | ICD-10-CM | POA: Insufficient documentation

## 2013-01-17 DIAGNOSIS — I252 Old myocardial infarction: Secondary | ICD-10-CM | POA: Insufficient documentation

## 2013-01-17 DIAGNOSIS — Y929 Unspecified place or not applicable: Secondary | ICD-10-CM | POA: Insufficient documentation

## 2013-01-17 DIAGNOSIS — S9032XA Contusion of left foot, initial encounter: Secondary | ICD-10-CM

## 2013-01-17 DIAGNOSIS — Z951 Presence of aortocoronary bypass graft: Secondary | ICD-10-CM | POA: Insufficient documentation

## 2013-01-17 DIAGNOSIS — R42 Dizziness and giddiness: Secondary | ICD-10-CM | POA: Insufficient documentation

## 2013-01-17 DIAGNOSIS — X500XXA Overexertion from strenuous movement or load, initial encounter: Secondary | ICD-10-CM | POA: Insufficient documentation

## 2013-01-17 DIAGNOSIS — Z8719 Personal history of other diseases of the digestive system: Secondary | ICD-10-CM | POA: Insufficient documentation

## 2013-01-17 DIAGNOSIS — I2510 Atherosclerotic heart disease of native coronary artery without angina pectoris: Secondary | ICD-10-CM | POA: Insufficient documentation

## 2013-01-17 DIAGNOSIS — I10 Essential (primary) hypertension: Secondary | ICD-10-CM | POA: Insufficient documentation

## 2013-01-17 DIAGNOSIS — M069 Rheumatoid arthritis, unspecified: Secondary | ICD-10-CM | POA: Insufficient documentation

## 2013-01-17 DIAGNOSIS — Z8701 Personal history of pneumonia (recurrent): Secondary | ICD-10-CM | POA: Insufficient documentation

## 2013-01-17 DIAGNOSIS — Z8673 Personal history of transient ischemic attack (TIA), and cerebral infarction without residual deficits: Secondary | ICD-10-CM | POA: Insufficient documentation

## 2013-01-17 DIAGNOSIS — Z8679 Personal history of other diseases of the circulatory system: Secondary | ICD-10-CM | POA: Insufficient documentation

## 2013-01-17 DIAGNOSIS — Y9389 Activity, other specified: Secondary | ICD-10-CM | POA: Insufficient documentation

## 2013-01-17 DIAGNOSIS — E785 Hyperlipidemia, unspecified: Secondary | ICD-10-CM | POA: Insufficient documentation

## 2013-01-17 DIAGNOSIS — Z862 Personal history of diseases of the blood and blood-forming organs and certain disorders involving the immune mechanism: Secondary | ICD-10-CM | POA: Insufficient documentation

## 2013-01-17 DIAGNOSIS — W19XXXA Unspecified fall, initial encounter: Secondary | ICD-10-CM | POA: Insufficient documentation

## 2013-01-17 MED ORDER — TRAMADOL HCL 50 MG PO TABS: 50.0000 mg | ORAL_TABLET | Freq: Four times a day (QID) | ORAL | Status: DC | PRN

## 2013-01-17 NOTE — ED Notes (Signed)
Patient states she was reaching up to get her pajamas off of a hook and got dizzy and fell.  Pain with walking in the left foot with swelling and bruising noted to dorsal foot.  Bruising on left elbow noted as well.

## 2013-01-24 NOTE — ED Provider Notes (Signed)
History    79 year old female with left foot pain. Patient states that it's she stepped akwardly with an acute onset of pain shortly before arrival. She did feel dizzy at that time but she says that's she often gets the same symptoms, particularly with change of position. Minimal pain in her left elbow as well. Does not think she hit her head. Specifically denies acute headache, neck or back pain. No confusion per sinus at bedside. No use of blood thinning medication.   CSN: [id number]  Arrival date & time 01/17/13  0730   First MD Initiated Contact with Patient 01/17/13 [phone]      Chief Complaint  Patient presents with  . Foot Injury    left    (Consider location/radiation/quality/duration/timing/severity/associated sxs/prior treatment) HPI  Past Medical History  Diagnosis Date  . Coronary heart disease   . CVA (cerebral infarction) 04/25/10    MRI L parietal lobe infarction   . Hyperlipidemia   . Diabetes mellitus, type 2   . GERD (gastroesophageal reflux disease)   . 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 Rheumatoid arthritis     on Arava. Dx for 20 yrs  . Aneurysm     "on my heart; dr said I didn't have to worry about it, it's not big enough yet"  . Hypertension   . Myocardial infarction 1998; 2005  . Pneumonia 11/16/11    "first time ever"  . Anemia   . Blood transfusion   . Stroke     Past Surgical History  Procedure Laterality Date  . Breast reduction surgery    . Cholecystectomy    . Appendectomy    . Tonsillectomy    . Vesicovaginal fistula closure w/ tah    . Carpal tunnel release      left  . Coronary angioplasty with stent placement  ~ 2002    "1"  . Hernia repair      umbilical  . Abdominal hysterectomy    . Cataract extraction w/ intraocular lens  implant, bilateral    . Coronary artery bypass graft  1998    vCABG X4 w/alve repair    Family History  Problem Relation Age of Onset  . Heart disease Mother   .  Heart disease Brother     x 5  . Heart disease Sister     x 5  . Heart disease Father     History  Substance Use Topics  . Smoking status: Never Smoker   . Smokeless tobacco: Never Used  . Alcohol Use: No     Comment: rare    OB History   Grav Para Term Preterm Abortions TAB SAB Ect Mult Living                  Review of Systems  All systems reviewed and negative, other than as noted in HPI.   Allergies  Amoxicillin-pot clavulanate  Home Medications   Current Outpatient Rx  Name  Route  Sig  Dispense  Refill  . Oxygen Permeable Lens Products SOLN   Does not apply   by Does not apply route at bedtime.         Marland Kitchen atorvastatin (LIPITOR) 10 MG tablet   Oral   Take 10 mg by mouth daily.          Marland Kitchen dipyridamole-aspirin (AGGRENOX) 200-25 MG per 12 hr capsule   Oral   Take 1 capsule by mouth 2 (  two) times daily.         Marland Kitchen glipiZIDE (GLUCOTROL XL) 2.5 MG 24 hr tablet   Oral   Take 2.5 mg by mouth daily.           . hydroxychloroquine (PLAQUENIL) 200 MG tablet   Oral   Take 300 mg by mouth daily.         Marland Kitchen leflunomide (ARAVA) 20 MG tablet   Oral   Take 10 mg by mouth daily.          Marland Kitchen loperamide (IMODIUM A-D) 2 MG tablet   Oral   Take 2 mg by mouth as needed. For diaherra         . metoprolol (TOPROL-XL) 50 MG 24 hr tablet   Oral   Take 50 mg by mouth 2 (two) times daily.           . metoprolol tartrate (LOPRESSOR) 25 MG tablet      TAKE 1 TABLET BY MOUTH TWICE DAILY   180 tablet   0     **Patient requests 90 days supply**   . Multiple Vitamins-Minerals (CENTRUM SILVER PO)   Oral   Take 1 tablet by mouth daily.          . nitroGLYCERIN (NITROSTAT) 0.4 MG SL tablet   Sublingual   Place 1 tablet (0.4 mg total) under the tongue every 5 (five) minutes as needed. For chest pain   25 tablet   6   . Omega-3 Fatty Acids (FISH OIL) 1200 MG CAPS   Oral   Take 1 capsule by mouth daily.          . SYMBICORT 160-4.5 MCG/ACT inhaler       INHALE TWO (2) PUFF(S) BY MOUTH TWICE DAILY ** RINSE MOUTH AF TER EACH USE   1 Inhaler   5   . traMADol (ULTRAM) 50 MG tablet   Oral   Take 1 tablet (50 mg total) by mouth every 6 (six) hours as needed for pain.   15 tablet   0     BP 177/98  Pulse 74  Temp(Src) 97.6 F (36.4 C) (Oral)  Resp 20  SpO2 98%  Physical Exam  Nursing note and vitals reviewed. Constitutional: She appears well-developed and well-nourished. No distress.  HENT:  Head: Normocephalic and atraumatic.  Eyes: Conjunctivae are normal. Right eye exhibits no discharge. Left eye exhibits no discharge.  Neck: Neck supple.  Cardiovascular: Normal rate, regular rhythm and normal heart sounds.  Exam reveals no gallop and no friction rub.   No murmur heard. Pulmonary/Chest: Effort normal and breath sounds normal. No respiratory distress.  Abdominal: Soft. She exhibits no distension. There is no tenderness.  Musculoskeletal: She exhibits no edema and no tenderness.  Mild swelling of the left foot with some faint ecchymosis over the distal dorsal aspect. No significant tenderness over either malleoli. Neurovascular intact distally.  Neurological: She is alert.  Skin: Skin is warm and dry.  Psychiatric: She has a normal mood and affect. Her behavior is normal. Thought content normal.    ED Course  Procedures (including critical care time)  Labs Reviewed - No data to display No results found.   1. Foot contusion, left, initial encounter       MDM  79 year old female with left foot pain. Clinically contusion. X-rays negative for acute osseous injury. Plan symptomatic treatment at this time. Return precautions discussed. Outpatient followup as needed otherwise.       Raeford Razor, MD  01/24/13 0100 

## 2013-01-26 ENCOUNTER — Other Ambulatory Visit: Payer: Self-pay | Admitting: Cardiology

## 2013-01-27 ENCOUNTER — Other Ambulatory Visit: Payer: Self-pay | Admitting: Cardiology

## 2013-06-01 ENCOUNTER — Ambulatory Visit (INDEPENDENT_AMBULATORY_CARE_PROVIDER_SITE_OTHER): Payer: Medicare Other | Admitting: Family Medicine

## 2013-06-01 ENCOUNTER — Ambulatory Visit (INDEPENDENT_AMBULATORY_CARE_PROVIDER_SITE_OTHER): Payer: Medicare Other | Admitting: Critical Care Medicine

## 2013-06-01 ENCOUNTER — Encounter: Payer: Self-pay | Admitting: Family Medicine

## 2013-06-01 ENCOUNTER — Encounter: Payer: Self-pay | Admitting: Critical Care Medicine

## 2013-06-01 VITALS — BP 112/76 | HR 67 | Temp 97.8°F | Ht 59.0 in | Wt 125.0 lb

## 2013-06-01 VITALS — BP 128/80 | HR 61 | Temp 98.1°F | Ht 59.0 in | Wt 125.1 lb

## 2013-06-01 DIAGNOSIS — K5792 Diverticulitis of intestine, part unspecified, without perforation or abscess without bleeding: Secondary | ICD-10-CM | POA: Insufficient documentation

## 2013-06-01 DIAGNOSIS — I2510 Atherosclerotic heart disease of native coronary artery without angina pectoris: Secondary | ICD-10-CM

## 2013-06-01 DIAGNOSIS — I714 Abdominal aortic aneurysm, without rupture, unspecified: Secondary | ICD-10-CM

## 2013-06-01 DIAGNOSIS — E119 Type 2 diabetes mellitus without complications: Secondary | ICD-10-CM

## 2013-06-01 DIAGNOSIS — I10 Essential (primary) hypertension: Secondary | ICD-10-CM

## 2013-06-01 DIAGNOSIS — Z23 Encounter for immunization: Secondary | ICD-10-CM

## 2013-06-01 DIAGNOSIS — J8410 Pulmonary fibrosis, unspecified: Secondary | ICD-10-CM

## 2013-06-01 DIAGNOSIS — M069 Rheumatoid arthritis, unspecified: Secondary | ICD-10-CM

## 2013-06-01 NOTE — Assessment & Plan Note (Signed)
Pulmonary fibrosis on the basis of rheumatoid lung disease Nocturnal hypoxemia Associated cyclic cough which may be aggravated by fish oil Airway obstruction as well Plan Stop fish oil for two weeks to see if will help the cough, if it does, do not restart. If no difference off the fish oil, may resume  Stay on symbicort An overnight sleep oxygen test will be obtained Try mucinex DM or delsym over the counter for the cough Return 6 months

## 2013-06-01 NOTE — Patient Instructions (Addendum)
Preventive Care for Adults, Female A healthy lifestyle and preventive care can promote health and wellness. Preventive health guidelines for women include the following key practices.  A routine yearly physical is a good way to check with your caregiver about your health and preventive screening. It is a chance to share any concerns and updates on your health, and to receive a thorough exam.  Visit your dentist for a routine exam and preventive care every 6 months. Brush your teeth twice a day and floss once a day. Good oral hygiene prevents tooth decay and gum disease.  The frequency of eye exams is based on your age, health, family medical history, use of contact lenses, and other factors. Follow your caregiver's recommendations for frequency of eye exams.  Eat a healthy diet. Foods like vegetables, fruits, whole grains, low-fat dairy products, and lean protein foods contain the nutrients you need without too many calories. Decrease your intake of foods high in solid fats, added sugars, and salt. Eat the right amount of calories for you.Get information about a proper diet from your caregiver, if necessary.  Regular physical exercise is one of the most important things you can do for your health. Most adults should get at least 150 minutes of moderate-intensity exercise (any activity that increases your heart rate and causes you to sweat) each week. In addition, most adults need muscle-strengthening exercises on 2 or more days a week.  Maintain a healthy weight. The body mass index (BMI) is a screening tool to identify possible weight problems. It provides an estimate of body fat based on height and weight. Your caregiver can help determine your BMI, and can help you achieve or maintain a healthy weight.For adults 20 years and older:  A BMI below 18.5 is considered underweight.  A BMI of 18.5 to 24.9 is normal.  A BMI of 25 to 29.9 is considered overweight.  A BMI of 30 and above is  considered obese.  Maintain normal blood lipids and cholesterol levels by exercising and minimizing your intake of saturated fat. Eat a balanced diet with plenty of fruit and vegetables. Blood tests for lipids and cholesterol should begin at age 20 and be repeated every 5 years. If your lipid or cholesterol levels are high, you are over 50, or you are at high risk for heart disease, you may need your cholesterol levels checked more frequently.Ongoing high lipid and cholesterol levels should be treated with medicines if diet and exercise are not effective.  If you smoke, find out from your caregiver how to quit. If you do not use tobacco, do not start.  If you are pregnant, do not drink alcohol. If you are breastfeeding, be very cautious about drinking alcohol. If you are not pregnant and choose to drink alcohol, do not exceed 1 drink per day. One drink is considered to be 12 ounces (355 mL) of beer, 5 ounces (148 mL) of wine, or 1.5 ounces (44 mL) of liquor.  Avoid use of street drugs. Do not share needles with anyone. Ask for help if you need support or instructions about stopping the use of drugs.  High blood pressure causes heart disease and increases the risk of stroke. Your blood pressure should be checked at least every 1 to 2 years. Ongoing high blood pressure should be treated with medicines if weight loss and exercise are not effective.  If you are 55 to 77 years old, ask your caregiver if you should take aspirin to prevent strokes.  Diabetes   screening involves taking a blood sample to check your fasting blood sugar level. This should be done once every 3 years, after age 45, if you are within normal weight and without risk factors for diabetes. Testing should be considered at a younger age or be carried out more frequently if you are overweight and have at least 1 risk factor for diabetes.  Breast cancer screening is essential preventive care for women. You should practice "breast  self-awareness." This means understanding the normal appearance and feel of your breasts and may include breast self-examination. Any changes detected, no matter how small, should be reported to a caregiver. Women in their 20s and 30s should have a clinical breast exam (CBE) by a caregiver as part of a regular health exam every 1 to 3 years. After age 40, women should have a CBE every year. Starting at age 40, women should consider having a mammography (breast X-ray test) every year. Women who have a family history of breast cancer should talk to their caregiver about genetic screening. Women at a high risk of breast cancer should talk to their caregivers about having magnetic resonance imaging (MRI) and a mammography every year.  The Pap test is a screening test for cervical cancer. A Pap test can show cell changes on the cervix that might become cervical cancer if left untreated. A Pap test is a procedure in which cells are obtained and examined from the lower end of the uterus (cervix).  Women should have a Pap test starting at age 21.  Between ages 21 and 29, Pap tests should be repeated every 2 years.  Beginning at age 30, you should have a Pap test every 3 years as long as the past 3 Pap tests have been normal.  Some women have medical problems that increase the chance of getting cervical cancer. Talk to your caregiver about these problems. It is especially important to talk to your caregiver if a new problem develops soon after your last Pap test. In these cases, your caregiver may recommend more frequent screening and Pap tests.  The above recommendations are the same for women who have or have not gotten the vaccine for human papillomavirus (HPV).  If you had a hysterectomy for a problem that was not cancer or a condition that could lead to cancer, then you no longer need Pap tests. Even if you no longer need a Pap test, a regular exam is a good idea to make sure no other problems are  starting.  If you are between ages 65 and 70, and you have had normal Pap tests going back 10 years, you no longer need Pap tests. Even if you no longer need a Pap test, a regular exam is a good idea to make sure no other problems are starting.  If you have had past treatment for cervical cancer or a condition that could lead to cancer, you need Pap tests and screening for cancer for at least 20 years after your treatment.  If Pap tests have been discontinued, risk factors (such as a new sexual partner) need to be reassessed to determine if screening should be resumed.  The HPV test is an additional test that may be used for cervical cancer screening. The HPV test looks for the virus that can cause the cell changes on the cervix. The cells collected during the Pap test can be tested for HPV. The HPV test could be used to screen women aged 30 years and older, and should   be used in women of any age who have unclear Pap test results. After the age of 30, women should have HPV testing at the same frequency as a Pap test.  Colorectal cancer can be detected and often prevented. Most routine colorectal cancer screening begins at the age of 50 and continues through age 75. However, your caregiver may recommend screening at an earlier age if you have risk factors for colon cancer. On a yearly basis, your caregiver may provide home test kits to check for hidden blood in the stool. Use of a small camera at the end of a tube, to directly examine the colon (sigmoidoscopy or colonoscopy), can detect the earliest forms of colorectal cancer. Talk to your caregiver about this at age 50, when routine screening begins. Direct examination of the colon should be repeated every 5 to 10 years through age 75, unless early forms of pre-cancerous polyps or small growths are found.  Hepatitis C blood testing is recommended for all people born from 1945 through 1965 and any individual with known risks for hepatitis C.  Practice  safe sex. Use condoms and avoid high-risk sexual practices to reduce the spread of sexually transmitted infections (STIs). STIs include gonorrhea, chlamydia, syphilis, trichomonas, herpes, HPV, and human immunodeficiency virus (HIV). Herpes, HIV, and HPV are viral illnesses that have no cure. They can result in disability, cancer, and death. Sexually active women aged 25 and younger should be checked for chlamydia. Older women with new or multiple partners should also be tested for chlamydia. Testing for other STIs is recommended if you are sexually active and at increased risk.  Osteoporosis is a disease in which the bones lose minerals and strength with aging. This can result in serious bone fractures. The risk of osteoporosis can be identified using a bone density scan. Women ages 65 and over and women at risk for fractures or osteoporosis should discuss screening with their caregivers. Ask your caregiver whether you should take a calcium supplement or vitamin D to reduce the rate of osteoporosis.  Menopause can be associated with physical symptoms and risks. Hormone replacement therapy is available to decrease symptoms and risks. You should talk to your caregiver about whether hormone replacement therapy is right for you.  Use sunscreen with sun protection factor (SPF) of 30 or more. Apply sunscreen liberally and repeatedly throughout the day. You should seek shade when your shadow is shorter than you. Protect yourself by wearing long sleeves, pants, a wide-brimmed hat, and sunglasses year round, whenever you are outdoors.  Once a month, do a whole body skin exam, using a mirror to look at the skin on your back. Notify your caregiver of new moles, moles that have irregular borders, moles that are larger than a pencil eraser, or moles that have changed in shape or color.  Stay current with required immunizations.  Influenza. You need a dose every fall (or winter). The composition of the flu vaccine  changes each year, so being vaccinated once is not enough.  Pneumococcal polysaccharide. You need 1 to 2 doses if you smoke cigarettes or if you have certain chronic medical conditions. You need 1 dose at age 65 (or older) if you have never been vaccinated.  Tetanus, diphtheria, pertussis (Tdap, Td). Get 1 dose of Tdap vaccine if you are younger than age 65, are over 65 and have contact with an infant, are a healthcare worker, are pregnant, or simply want to be protected from whooping cough. After that, you need a Td   booster dose every 10 years. Consult your caregiver if you have not had at least 3 tetanus and diphtheria-containing shots sometime in your life or have a deep or dirty wound.  HPV. You need this vaccine if you are a woman age 26 or younger. The vaccine is given in 3 doses over 6 months.  Measles, mumps, rubella (MMR). You need at least 1 dose of MMR if you were born in 1957 or later. You may also need a second dose.  Meningococcal. If you are age 19 to 21 and a first-year college student living in a residence hall, or have one of several medical conditions, you need to get vaccinated against meningococcal disease. You may also need additional booster doses.  Zoster (shingles). If you are age 60 or older, you should get this vaccine.  Varicella (chickenpox). If you have never had chickenpox or you were vaccinated but received only 1 dose, talk to your caregiver to find out if you need this vaccine.  Hepatitis A. You need this vaccine if you have a specific risk factor for hepatitis A virus infection or you simply wish to be protected from this disease. The vaccine is usually given as 2 doses, 6 to 18 months apart.  Hepatitis B. You need this vaccine if you have a specific risk factor for hepatitis B virus infection or you simply wish to be protected from this disease. The vaccine is given in 3 doses, usually over 6 months. Preventive Services / Frequency Ages 19 to 39  Blood  pressure check.** / Every 1 to 2 years.  Lipid and cholesterol check.** / Every 5 years beginning at age 20.  Clinical breast exam.** / Every 3 years for women in their 20s and 30s.  Pap test.** / Every 2 years from ages 21 through 29. Every 3 years starting at age 30 through age 65 or 70 with a history of 3 consecutive normal Pap tests.  HPV screening.** / Every 3 years from ages 30 through ages 65 to 70 with a history of 3 consecutive normal Pap tests.  Hepatitis C blood test.** / For any individual with known risks for hepatitis C.  Skin self-exam. / Monthly.  Influenza immunization.** / Every year.  Pneumococcal polysaccharide immunization.** / 1 to 2 doses if you smoke cigarettes or if you have certain chronic medical conditions.  Tetanus, diphtheria, pertussis (Tdap, Td) immunization. / A one-time dose of Tdap vaccine. After that, you need a Td booster dose every 10 years.  HPV immunization. / 3 doses over 6 months, if you are 26 and younger.  Measles, mumps, rubella (MMR) immunization. / You need at least 1 dose of MMR if you were born in 1957 or later. You may also need a second dose.  Meningococcal immunization. / 1 dose if you are age 19 to 21 and a first-year college student living in a residence hall, or have one of several medical conditions, you need to get vaccinated against meningococcal disease. You may also need additional booster doses.  Varicella immunization.** / Consult your caregiver.  Hepatitis A immunization.** / Consult your caregiver. 2 doses, 6 to 18 months apart.  Hepatitis B immunization.** / Consult your caregiver. 3 doses usually over 6 months. Ages 40 to 64  Blood pressure check.** / Every 1 to 2 years.  Lipid and cholesterol check.** / Every 5 years beginning at age 20.  Clinical breast exam.** / Every year after age 40.  Mammogram.** / Every year beginning at age 40   and continuing for as long as you are in good health. Consult with your  caregiver.  Pap test.** / Every 3 years starting at age 30 through age 65 or 70 with a history of 3 consecutive normal Pap tests.  HPV screening.** / Every 3 years from ages 30 through ages 65 to 70 with a history of 3 consecutive normal Pap tests.  Fecal occult blood test (FOBT) of stool. / Every year beginning at age 50 and continuing until age 75. You may not need to do this test if you get a colonoscopy every 10 years.  Flexible sigmoidoscopy or colonoscopy.** / Every 5 years for a flexible sigmoidoscopy or every 10 years for a colonoscopy beginning at age 50 and continuing until age 75.  Hepatitis C blood test.** / For all people born from 1945 through 1965 and any individual with known risks for hepatitis C.  Skin self-exam. / Monthly.  Influenza immunization.** / Every year.  Pneumococcal polysaccharide immunization.** / 1 to 2 doses if you smoke cigarettes or if you have certain chronic medical conditions.  Tetanus, diphtheria, pertussis (Tdap, Td) immunization.** / A one-time dose of Tdap vaccine. After that, you need a Td booster dose every 10 years.  Measles, mumps, rubella (MMR) immunization. / You need at least 1 dose of MMR if you were born in 1957 or later. You may also need a second dose.  Varicella immunization.** / Consult your caregiver.  Meningococcal immunization.** / Consult your caregiver.  Hepatitis A immunization.** / Consult your caregiver. 2 doses, 6 to 18 months apart.  Hepatitis B immunization.** / Consult your caregiver. 3 doses, usually over 6 months. Ages 65 and over  Blood pressure check.** / Every 1 to 2 years.  Lipid and cholesterol check.** / Every 5 years beginning at age 20.  Clinical breast exam.** / Every year after age 40.  Mammogram.** / Every year beginning at age 40 and continuing for as long as you are in good health. Consult with your caregiver.  Pap test.** / Every 3 years starting at age 30 through age 65 or 70 with a 3  consecutive normal Pap tests. Testing can be stopped between 65 and 70 with 3 consecutive normal Pap tests and no abnormal Pap or HPV tests in the past 10 years.  HPV screening.** / Every 3 years from ages 30 through ages 65 or 70 with a history of 3 consecutive normal Pap tests. Testing can be stopped between 65 and 70 with 3 consecutive normal Pap tests and no abnormal Pap or HPV tests in the past 10 years.  Fecal occult blood test (FOBT) of stool. / Every year beginning at age 50 and continuing until age 75. You may not need to do this test if you get a colonoscopy every 10 years.  Flexible sigmoidoscopy or colonoscopy.** / Every 5 years for a flexible sigmoidoscopy or every 10 years for a colonoscopy beginning at age 50 and continuing until age 75.  Hepatitis C blood test.** / For all people born from 1945 through 1965 and any individual with known risks for hepatitis C.  Osteoporosis screening.** / A one-time screening for women ages 65 and over and women at risk for fractures or osteoporosis.  Skin self-exam. / Monthly.  Influenza immunization.** / Every year.  Pneumococcal polysaccharide immunization.** / 1 dose at age 65 (or older) if you have never been vaccinated.  Tetanus, diphtheria, pertussis (Tdap, Td) immunization. / A one-time dose of Tdap vaccine if you are over   65 and have contact with an infant, are a healthcare worker, or simply want to be protected from whooping cough. After that, you need a Td booster dose every 10 years.  Varicella immunization.** / Consult your caregiver.  Meningococcal immunization.** / Consult your caregiver.  Hepatitis A immunization.** / Consult your caregiver. 2 doses, 6 to 18 months apart.  Hepatitis B immunization.** / Check with your caregiver. 3 doses, usually over 6 months. ** Family history and personal history of risk and conditions may change your caregiver's recommendations. Document Released: 11/10/2001 Document Revised: 12/07/2011  Document Reviewed: 02/09/2011 [employer] Patient Information 2014 [employer]  

## 2013-06-01 NOTE — Patient Instructions (Addendum)
Stop fish oil for two weeks to see if will help the cough, if it does, do not restart. If no difference off the fish oil, may resume  Stay on symbicort An overnight sleep oxygen test will be obtained Try mucinex DM or delsym over the counter for the cough Return 6 months

## 2013-06-01 NOTE — Progress Notes (Signed)
Subjective:    Patient ID: Briana Ray, female    DOB: Nov 02, 1923, 79 y.o.   MRN: [id number]  HPI  79 y.o.   WF with interstital changes on CT chest in setting of RA. Has RA induced interstitial lung disease.  Past Hx pertinent for severe Rheumatoid arthritis Dx 49yrs ago. Rx with Leflunamide. Not on methotrexate.   06/01/2013 Chief Complaint  Patient presents with  . Yearly Follow up    Pt needs o2 recert - [facility].  Having increased DOE x last couple of months and prod cough with yellow mucus.  No chest tightness, chest pain, or fever.    Pt notes more coughing. Using oxygen every night and using symbicort twice daily.  No heartburn at all. Mucus is yellow, worse in the AM.  Only uses oxygen at night  Past Medical History  Diagnosis Date  . Coronary heart disease   . CVA (cerebral infarction) 04/25/10    MRI L parietal lobe infarction   . Hyperlipidemia   . Diabetes mellitus, type 2   . GERD (gastroesophageal reflux disease)   . 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 Rheumatoid arthritis(714.0)     on [place]rav[place]. Dx for 20 yrs  . Aneurysm     "on my heart; dr said I didn't have to worry about it, it's not big enough yet"  . Hypertension   . Myocardial infarction 1998; 2005  . Pneumonia 11/16/11    "first time ever"  . Anemia   . Blood transfusion   . Stroke   . Diverticulitis 2008     Family History  Problem Relation Age of Onset  . Arthritis Mother   . Other Brother     lung issues- oxygen  . Pneumonia Brother   . Diabetes Sister   . Hyperlipidemia Sister   . Hypertension Sister   . Stroke Sister   . Kidney disease Father   . Fibromyalgia Daughter   . Cancer Son     colon  . Cancer Sister     colon?  . Dementia Sister   . Alcohol abuse Brother   . Aneurysm Brother   . Heart disease Brother      History   Social History  . Marital Status: Widowed    Spouse Name: N/A    Number of Children: N/A  . Years of Education:  N/A   Occupational History  . Retired     [employer]   Social History Main Topics  . Smoking status: Never Smoker   . Smokeless tobacco: Never Used  . Alcohol Use: No     Comment: rare  . Drug Use: No  . Sexual Activity: Not Currently   Other Topics Concern  . Not on file   Social History Narrative  . No narrative on file     Allergies  Allergen Reactions  . Amoxicillin-Pot Clavulanate     REACTION: diarrhea     Outpatient Prescriptions Prior to Visit  Medication Sig Dispense Refill  . atorvastatin (LIPITOR) 10 MG tablet Take 10 mg by mouth daily.       Marland Kitchen dipyridamole-aspirin (AGGRENOX) 200-25 MG per 12 hr capsule Take 1 capsule by mouth 2 (two) times daily.      Marland Kitchen glipiZIDE (GLUCOTROL XL) 2.5 MG 24 hr tablet Take 2.5 mg by mouth daily.        . hydroxychloroquine (PLAQUENIL) 200 MG tablet Take 300 mg by mouth daily.      Marland Kitchen  leflunomide (ARAVA) 20 MG tablet Take 10 mg by mouth daily.       Marland Kitchen loperamide (IMODIUM A-D) 2 MG tablet Take 2 mg by mouth as needed. For diaherra      . metoprolol tartrate (LOPRESSOR) 25 MG tablet TAKE 1 TABLET BY MOUTH TWICE DAILY  180 tablet  0  . Multiple Vitamins-Minerals (CENTRUM SILVER PO) Take 1 tablet by mouth daily.       . nitroGLYCERIN (NITROSTAT) 0.4 MG SL tablet Place 1 tablet (0.4 mg total) under the tongue every 5 (five) minutes as needed. For chest pain  25 tablet  6  . Omega-3 Fatty Acids (FISH OIL) 1200 MG CAPS Take 1 capsule by mouth daily.       . Oxygen Permeable Lens Products SOLN by Does not apply route at bedtime.      . SYMBICORT 160-4.5 MCG/ACT inhaler INHALE TWO (2) PUFF(S) BY MOUTH TWICE DAILY ** RINSE MOUTH AF TER EACH USE  1 Inhaler  5  . traMADol (ULTRAM) 50 MG tablet Take 1 tablet (50 mg total) by mouth every 6 (six) hours as needed for pain.  15 tablet  0  . metoprolol (TOPROL-XL) 50 MG 24 hr tablet Take 50 mg by mouth 2 (two) times daily.         No facility-administered medications prior to visit.     Review  of Systems  Constitutional:   No  weight loss, night sweats,  Fevers, chills, fatigue, lassitude.  HEENT:   No headaches,  Difficulty swallowing,  Tooth/dental problems,  Sore throat,                No sneezing, itching, ear ache, nasal congestion, post nasal drip,   CV:  No chest pain,  Orthopnea, PND, swelling in lower extremities, anasarca, dizziness, palpitations  GI  No heartburn, indigestion,  vomiting, diarrhea,   loss of appetite  Resp: Notes  shortness of breath with exertion not  at rest.  No excess mucus, no  productive cough,  Notes  non-productive cough,  No coughing up of blood.  No change in color of mucus.  No wheezing.  No chest wall deformity  Skin: no rash or lesions.  GU: no dysuria, change in color of urine, no urgency or frequency.  No flank pain.  MS:  +chronic  joint pain     Psych:  No change in mood or affect. No depression or anxiety.  No memory loss.     Objective:   Physical Exam  Filed Vitals:   06/01/13 1557  BP: 112/76  Pulse: 67  Temp: 97.8 F (36.6 C)  TempSrc: Oral  Height: 4\' 11"  (1.499 m)  Weight: 125 lb (56.7 kg)  SpO2: 93%    Gen: Pleasant, well-nourished, in no distress,  normal affect  ENT: No lesions,  mouth clear,  oropharynx clear, no postnasal drip  Neck: No JVD, no TMG, no carotid bruits  Lungs: No use of accessory muscles, no dullness to percussion, dry rales,   Cardiovascular: RRR, heart sounds normal, no murmur or gallops, no peripheral edema  Abdomen: soft and NT, no HSM,  BS normal  Musculoskeletal: No deformities, no cyanosis or clubbing  Neuro: alert, non focal  Skin: Warm, no lesions or rashes  No results found.      Assessment & Plan:    PULMONARY FIBROSIS, POSTINFLAMMATORY Pulmonary fibrosis on the basis of rheumatoid lung disease Nocturnal hypoxemia Associated cyclic cough which may be aggravated by fish oil Airway obstruction  as well Plan Stop fish oil for two weeks to see if will help the  cough, if it does, do not restart. If no difference off the fish oil, may resume  Stay on symbicort An overnight sleep oxygen test will be obtained Try mucinex DM or delsym over the counter for the cough Return 6 months     Updated Medication List Outpatient Encounter Prescriptions as of 06/01/2013  Medication Sig Dispense Refill  . atorvastatin (LIPITOR) 10 MG tablet Take 10 mg by mouth daily.       Marland Kitchen dipyridamole-aspirin (AGGRENOX) 200-25 MG per 12 hr capsule Take 1 capsule by mouth 2 (two) times daily.      Marland Kitchen glipiZIDE (GLUCOTROL XL) 2.5 MG 24 hr tablet Take 2.5 mg by mouth daily.        . hydroxychloroquine (PLAQUENIL) 200 MG tablet Take 300 mg by mouth daily.      Marland Kitchen leflunomide (ARAVA) 20 MG tablet Take 10 mg by mouth daily.       Marland Kitchen loperamide (IMODIUM A-D) 2 MG tablet Take 2 mg by mouth as needed. For diaherra      . metoprolol tartrate (LOPRESSOR) 25 MG tablet TAKE 1 TABLET BY MOUTH TWICE DAILY  180 tablet  0  . Multiple Vitamins-Minerals (CENTRUM SILVER PO) Take 1 tablet by mouth daily.       . nitroGLYCERIN (NITROSTAT) 0.4 MG SL tablet Place 1 tablet (0.4 mg total) under the tongue every 5 (five) minutes as needed. For chest pain  25 tablet  6  . Omega-3 Fatty Acids (FISH OIL) 1200 MG CAPS Take 1 capsule by mouth daily.       . Oxygen Permeable Lens Products SOLN by Does not apply route at bedtime.      . SYMBICORT 160-4.5 MCG/ACT inhaler INHALE TWO (2) PUFF(S) BY MOUTH TWICE DAILY ** RINSE MOUTH AF TER EACH USE  1 Inhaler  5  . traMADol (ULTRAM) 50 MG tablet Take 1 tablet (50 mg total) by mouth every 6 (six) hours as needed for pain.  15 tablet  0  . metoprolol (TOPROL-XL) 50 MG 24 hr tablet Take 50 mg by mouth 2 (two) times daily.         No facility-administered encounter medications on file as of 06/01/2013.

## 2013-06-04 ENCOUNTER — Encounter: Payer: Self-pay | Admitting: Family Medicine

## 2013-06-04 NOTE — Assessment & Plan Note (Signed)
Stable, using O2 qhs

## 2013-06-04 NOTE — Assessment & Plan Note (Signed)
Patient agrees to return

## 2013-06-04 NOTE — Assessment & Plan Note (Signed)
Stable on [place]

## 2013-06-04 NOTE — Assessment & Plan Note (Signed)
bp well controlled today no changes

## 2013-06-04 NOTE — Assessment & Plan Note (Signed)
First visit here, stable, asymptomatic. No changes today

## 2013-06-04 NOTE — Assessment & Plan Note (Signed)
Being monitored elsewhere. asymptomatic

## 2013-06-04 NOTE — Progress Notes (Signed)
Patient ID: URI COVEY, female   DOB: Nov 22, 1923, 79 y.o.   MRN: [id number] Briana Ray [id number] April 18, 1924 06/04/2013      Progress Note-Follow Up  Subjective  Chief Complaint  Chief Complaint  Patient presents with  . Establish Care    new patient  . Injections    flu    HPI  Patient is an 79 year old Caucasian female who is in today for new patient appointment. She feels well today and has no acute concerns. She does have a long history of intermittent diarrhea which she takes Imodium and a probiotic 4. She follows closely with numerous specialists including rheumatology and pulmonology. She denies recent illness, chest pain, palpitations, shortness of breath, or GU complaint  Past Medical History  Diagnosis Date  . Coronary heart disease   . CVA (cerebral infarction) 04/25/10    MRI L parietal lobe infarction   . Hyperlipidemia   . Diabetes mellitus, type 2   . GERD (gastroesophageal reflux disease)   . 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 Rheumatoid arthritis(714.0)     on Arava. Dx for 20 yrs  . Aneurysm     "on my heart; dr said I didn't have to worry about it, it's not big enough yet"  . Hypertension   . Myocardial infarction 1998; 2005  . Pneumonia 11/16/11    "first time ever"  . Anemia   . Blood transfusion   . Stroke   . Diverticulitis 2008    Past Surgical History  Procedure Laterality Date  . Breast reduction surgery    . Cholecystectomy    . Appendectomy    . Tonsillectomy    . Vesicovaginal fistula closure w/ tah    . Carpal tunnel release      left  . Coronary angioplasty with stent placement  ~ 2002    "1"  . Hernia repair      umbilical  . Abdominal hysterectomy    . Cataract extraction w/ intraocular lens  implant, bilateral    . Coronary artery bypass graft  1998    vCABG X4 w/alve repair  . Breast surgery      reduction    Family History  Problem Relation Age of Onset  .  Arthritis Mother   . Other Brother     lung issues- oxygen  . Pneumonia Brother   . Diabetes Sister   . Hyperlipidemia Sister   . Hypertension Sister   . Stroke Sister   . Kidney disease Father   . Fibromyalgia Daughter   . Cancer Son     colon  . Cancer Sister     colon?  . Dementia Sister   . Alcohol abuse Brother   . Aneurysm Brother   . Heart disease Brother     History   Social History  . Marital Status: Widowed    Spouse Name: N/A    Number of Children: N/A  . Years of Education: N/A   Occupational History  . Retired     [employer]   Social History Main Topics  . Smoking status: Never Smoker   . Smokeless tobacco: Never Used  . Alcohol Use: No     Comment: rare  . Drug Use: No  . Sexual Activity: Not Currently   Other Topics Concern  . Not on file   Social History Narrative  . No narrative on file    Current Outpatient Prescriptions on  File Prior to Visit  Medication Sig Dispense Refill  . atorvastatin (LIPITOR) 10 MG tablet Take 10 mg by mouth daily.       Marland Kitchen dipyridamole-aspirin (AGGRENOX) 200-25 MG per 12 hr capsule Take 1 capsule by mouth 2 (two) times daily.      Marland Kitchen glipiZIDE (GLUCOTROL XL) 2.5 MG 24 hr tablet Take 2.5 mg by mouth daily.        . hydroxychloroquine (PLAQUENIL) 200 MG tablet Take 300 mg by mouth daily.      Marland Kitchen leflunomide (ARAVA) 20 MG tablet Take 10 mg by mouth daily.       Marland Kitchen loperamide (IMODIUM A-D) 2 MG tablet Take 2 mg by mouth as needed. For diaherra      . metoprolol (TOPROL-XL) 50 MG 24 hr tablet Take 50 mg by mouth 2 (two) times daily.        . metoprolol tartrate (LOPRESSOR) 25 MG tablet TAKE 1 TABLET BY MOUTH TWICE DAILY  180 tablet  0  . Multiple Vitamins-Minerals (CENTRUM SILVER PO) Take 1 tablet by mouth daily.       . nitroGLYCERIN (NITROSTAT) 0.4 MG SL tablet Place 1 tablet (0.4 mg total) under the tongue every 5 (five) minutes as needed. For chest pain  25 tablet  6  . Omega-3 Fatty Acids (FISH OIL) 1200 MG CAPS  Take 1 capsule by mouth daily.       . Oxygen Permeable Lens Products SOLN by Does not apply route at bedtime.      . SYMBICORT 160-4.5 MCG/ACT inhaler INHALE TWO (2) PUFF(S) BY MOUTH TWICE DAILY ** RINSE MOUTH AF TER EACH USE  1 Inhaler  5  . traMADol (ULTRAM) 50 MG tablet Take 1 tablet (50 mg total) by mouth every 6 (six) hours as needed for pain.  15 tablet  0   No current facility-administered medications on file prior to visit.    Allergies  Allergen Reactions  . Amoxicillin-Pot Clavulanate     REACTION: diarrhea    Review of Systems  Review of Systems  Constitutional: Negative for fever, chills and malaise/fatigue.  HENT: Negative for hearing loss, nosebleeds and congestion.   Eyes: Negative for discharge.  Respiratory: Negative for cough, sputum production, shortness of breath and wheezing.   Cardiovascular: Negative for chest pain, palpitations and leg swelling.  Gastrointestinal: Negative for heartburn, nausea, vomiting, abdominal pain, diarrhea, constipation and blood in stool.  Genitourinary: Negative for dysuria, urgency, frequency and hematuria.  Musculoskeletal: Negative for myalgias, back pain and falls.  Skin: Negative for rash.  Neurological: Negative for dizziness, tremors, sensory change, focal weakness, loss of consciousness, weakness and headaches.  Endo/Heme/Allergies: Negative for polydipsia. Does not bruise/bleed easily.  Psychiatric/Behavioral: Negative for depression and suicidal ideas. The patient is not nervous/anxious and does not have insomnia.     Objective  BP 128/80  Pulse 61  Temp(Src) 98.1 F (36.7 C) (Oral)  Ht 4\' 11"  (1.499 m)  Wt 125 lb 1.3 oz (56.736 kg)  BMI 25.25 kg/m2  SpO2 95%  Physical Exam  Physical Exam  Constitutional: She is oriented to person, place, and time and well-developed, well-nourished, and in no distress. No distress.  HENT:  Head: Normocephalic and atraumatic.  Eyes: Conjunctivae are normal.  Neck: Neck  supple. No thyromegaly present.  Cardiovascular: Normal rate, regular rhythm and normal heart sounds.   No murmur heard. Pulmonary/Chest: Effort normal and breath sounds normal. She has no wheezes.  Abdominal: She exhibits no distension and no mass.  Musculoskeletal: She exhibits no edema.  Lymphadenopathy:    She has no cervical adenopathy.  Neurological: She is alert and oriented to person, place, and time.  Skin: Skin is warm and dry. No rash noted. She is not diaphoretic.  Psychiatric: Memory, affect and judgment normal.    No results found for this basename: TSH   Lab Results  Component Value Date   WBC 8.7 11/19/2011   HGB 11.8* 11/19/2011   HCT 35.3* 11/19/2011   MCV 94.9 11/19/2011   PLT 238 11/19/2011   Lab Results  Component Value Date   CREATININE 1.26* 11/20/2011   BUN 36* 11/20/2011   NA 142 11/20/2011   K 3.2* 11/20/2011   CL 104 11/20/2011   CO2 28 11/20/2011   Lab Results  Component Value Date   ALT 27 08/10/2012   AST 33 08/10/2012   ALKPHOS 100 08/10/2012   BILITOT 0.6 08/10/2012   Lab Results  Component Value Date   CHOL 163 08/10/2012   Lab Results  Component Value Date   HDL 42 08/10/2012   Lab Results  Component Value Date   LDLCALC 85 08/10/2012   Lab Results  Component Value Date   TRIG 181* 08/10/2012   Lab Results  Component Value Date   CHOLHDL 3.9 08/10/2012     Assessment & Plan  HYPERTENSION bp well controlled today no changes  CORONARY HEART DISEASE First visit here, stable, asymptomatic. No changes today  Abdominal aortic aneurysm Being monitored elsewhere. asymptomatic  DIABETES MELLITUS, TYPE II Patient agrees to return  PULMONARY FIBROSIS, POSTINFLAMMATORY Stable, using O2 qhs  Rheumatoid arthritis(714.0) Stable on [place]

## 2013-06-23 ENCOUNTER — Ambulatory Visit (INDEPENDENT_AMBULATORY_CARE_PROVIDER_SITE_OTHER): Payer: Medicare Other | Admitting: Physician Assistant

## 2013-06-23 ENCOUNTER — Encounter: Payer: Self-pay | Admitting: Physician Assistant

## 2013-06-23 VITALS — BP 92/68 | HR 61 | Temp 98.7°F | Resp 14 | Ht 59.0 in | Wt 126.8 lb

## 2013-06-23 DIAGNOSIS — R42 Dizziness and giddiness: Secondary | ICD-10-CM

## 2013-06-23 DIAGNOSIS — I10 Essential (primary) hypertension: Secondary | ICD-10-CM

## 2013-06-23 MED ORDER — METOPROLOL SUCCINATE ER 25 MG PO TB24: 25.0000 mg | ORAL_TABLET | Freq: Two times a day (BID) | ORAL | Status: DC

## 2013-06-23 NOTE — Patient Instructions (Addendum)
Please take 12.5 mg (1/2 tablet) metoprolol BID.  Stay well hydrated.  Continue all other medications as prescribed.  Make sure that you do not get up quickly and wait a few seconds after standing before walking.   I will want to see you in 2 weeks.  Orthostatic Hypotension Orthostatic hypotension is a sudden fall in blood pressure. It occurs when a person goes from a sitting or lying position to a standing position. CAUSES   Loss of body fluids (dehydration).  Medicines that lower blood pressure.  Sudden changes in posture, such as sudden standing when you have been sitting or lying down.  Taking too much of your medicine. SYMPTOMS   Lightheadedness or dizziness.  Fainting or near-fainting.  A fast heart rate (tachycardia).  Weakness.  Feeling tired (fatigue). DIAGNOSIS  Your caregiver may find the cause of orthostatic hypotension through:  A history and/or physical exam.  Checking your blood pressure. Your caregiver will check your blood pressure when you are:  Lying down.  Sitting.  Standing.  Tilt table testing. In this test, you are placed on a table that goes from a lying position to a standing position. You will be strapped to the table. This test helps to monitor your blood pressure and heart rate when you are in different positions. TREATMENT   If orthostatic hypotension is caused by your medicines, your caregiver will need to adjust your dosage. Do not stop or adjust your medicine on your own.  When changing positions, make these changes slowly. This allows your body to adjust to the different position.  Compression stockings that are worn on your lower legs may be helpful.  Your caregiver may have you consume extra salt. Do not add extra salt to your diet unless directed by your caregiver.  Eat frequent, small meals. Avoid sudden standing after eating.  Avoid hot showers or excessive heat.  Your caregiver may give you fluids through the vein  (intravenous).  Your caregiver may put you on medicine to help enhance fluid retention. SEEK IMMEDIATE MEDICAL CARE IF:   You faint or have a near-fainting episode. Call your local emergency services (911 in [place]  You have or develop chest pain.  You feel sick to your stomach (nauseous) or vomit.  You have a loss of feeling or movement in your arms or legs.  You have difficulty talking, slurred speech, or you are unable to talk.  You have difficulty thinking or have confused thinking. MAKE SURE YOU:   Understand these instructions.  Will watch your condition.  Will get help right away if you are not doing well or get worse. Document Released: 09/04/2002 Document Revised: 12/07/2011 Document Reviewed: 12/28/2008 [facility] Patient Information 2014 [place], [place].

## 2013-06-23 NOTE — Progress Notes (Addendum)
Patient ID: Briana Ray, female   DOB: Nov 02, 1923, 79 y.o.   MRN: [id number]  Patient presents to clinic today c/o episodes of dizziness that have been present over the past few months.  Patient states episodes occur when she stands up too quickly and/or tries to walk right after standing.  Episodes last about 10-30 seconds.  Patient has experienced falls with these episodes.  Denies loss of consciousness or trauma during these episodes.  She states she usually falls against something that she is holding on to.  Patient denies history or vertigo or syncope.  Does have history of TIA and MI in the past.  Patient also with history of HTN and hyperlipidemia.  Patient denies chest pain, palpitations, cough or shortness of breath associated with her episodes.  Denies change in mentation, slurred speech, focal weaknesses.  Ambulates successfully each day with a cane.  Patient with  cardiac workup including CXR, stress testing in December 2013 and April 2014.   Past Medical History  Diagnosis Date  . Coronary heart disease   . CVA (cerebral infarction) 04/25/10    MRI L parietal lobe infarction   . Hyperlipidemia   . Diabetes mellitus, type 2   . GERD (gastroesophageal reflux disease)   . 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 Rheumatoid arthritis(714.0)     on Arava. Dx for 20 yrs  . Aneurysm     "on my heart; dr said I didn't have to worry about it, it's not big enough yet"  . Hypertension   . Myocardial infarction 1998; 2005  . Pneumonia 11/16/11    "first time ever"  . Anemia   . Blood transfusion   . Stroke   . Diverticulitis 2008    Current Outpatient Prescriptions on File Prior to Visit  Medication Sig Dispense Refill  . dipyridamole-aspirin (AGGRENOX) 200-25 MG per 12 hr capsule Take 1 capsule by mouth 2 (two) times daily.      Marland Kitchen glipiZIDE (GLUCOTROL XL) 2.5 MG 24 hr tablet Take 2.5 mg by mouth daily.        . hydroxychloroquine (PLAQUENIL)  200 MG tablet Take 300 mg by mouth daily.      Marland Kitchen leflunomide (ARAVA) 20 MG tablet Take 10 mg by mouth daily.       Marland Kitchen loperamide (IMODIUM A-D) 2 MG tablet Take 2 mg by mouth as needed. For diaherra      . Multiple Vitamins-Minerals (CENTRUM SILVER PO) Take 1 tablet by mouth daily.       . nitroGLYCERIN (NITROSTAT) 0.4 MG SL tablet Place 1 tablet (0.4 mg total) under the tongue every 5 (five) minutes as needed. For chest pain  25 tablet  6  . Omega-3 Fatty Acids (FISH OIL) 1200 MG CAPS Take 1 capsule by mouth daily.       . SYMBICORT 160-4.5 MCG/ACT inhaler INHALE TWO (2) PUFF(S) BY MOUTH TWICE DAILY ** RINSE MOUTH AF TER EACH USE  1 Inhaler  5  . traMADol (ULTRAM) 50 MG tablet Take 1 tablet (50 mg total) by mouth every 6 (six) hours as needed for pain.  15 tablet  0   No current facility-administered medications on file prior to visit.    Allergies  Allergen Reactions  . Amoxicillin-Pot Clavulanate     REACTION: diarrhea    Family History  Problem Relation Age of Onset  . Arthritis Mother   . Other Brother     lung  issues- oxygen  . Pneumonia Brother   . Diabetes Sister   . Hyperlipidemia Sister   . Hypertension Sister   . Stroke Sister   . Kidney disease Father   . Fibromyalgia Daughter   . Cancer Son     colon  . Cancer Sister     colon?  . Dementia Sister   . Alcohol abuse Brother   . Aneurysm Brother   . Heart disease Brother     History   Social History  . Marital Status: Widowed    Spouse Name: N/A    Number of Children: N/A  . Years of Education: N/A   Occupational History  . Retired     [employer]   Social History Main Topics  . Smoking status: Never Smoker   . Smokeless tobacco: Never Used  . Alcohol Use: No     Comment: rare  . Drug Use: No  . Sexual Activity: Not Currently   Other Topics Concern  . None   Social History Narrative  . None    ROS See HPI.  All other ROS negative.   Filed Vitals:   06/23/13 1354 06/23/13 1434 06/23/13  1435  BP: 110/70 99/74 92/68   Pulse: 61    Temp: 98.7 F (37.1 C)    TempSrc: Oral    Resp: 14    Height: 4\' 11"  (1.499 m)    Weight: 126 lb 12 oz (57.493 kg)    SpO2: 97%      Physical Exam  Vitals reviewed. Constitutional: She is oriented to person, place, and time.  HENT:  Head: Normocephalic and atraumatic.  Right Ear: External ear normal.  Left Ear: External ear normal.  Nose: Nose normal.  Mouth/Throat: Oropharynx is clear and moist. No oropharyngeal exudate.  TM WNL bilaterally  Eyes: Conjunctivae and EOM are normal. Pupils are equal, round, and reactive to light.  Neck: Neck supple.  Cardiovascular: Normal rate, regular rhythm, normal heart sounds and intact distal pulses.   Pulmonary/Chest: Effort normal and breath sounds normal. No respiratory distress. She has no wheezes. She has no rales. She exhibits no tenderness.  Lymphadenopathy:    She has no cervical adenopathy.  Neurological: She is alert and oriented to person, place, and time. No cranial nerve deficit. Gait normal. Coordination normal. GCS score is 15.  Skin: Skin is warm and dry. No rash noted.   Assessment/Plan: HYPERTENSION Patient complaining of dizziness and lightheadedness with standing.  Orthostatics are positive in clinic.  Discussed case with NP Peggyann Juba. Will reduce metoprolol to 12.5 mg BID.  Encourage adequate fluid intake. Monitor BP at home. Discussed importance of sitting for a few minutes before standing when getting out of bed.  Count to ten after standing before walking when trying to get up.  Follow-up in 2 weeks with myself or Dr. Abner Greenspan.  Orthostatic lightheadedness Patient complaining of dizziness and lightheadedness with standing.  Orthostatics are positive in clinic.  Discussed case with NP Peggyann Juba. Will reduce metoprolol to 12.5 mg BID.  Encourage adequate fluid intake. Monitor BP at home. Discussed importance of sitting for a few minutes before standing when getting out of bed.   Count to ten after standing before walking when trying to get up.  Follow-up in 2 weeks with myself or Dr. Abner Greenspan.

## 2013-06-24 ENCOUNTER — Other Ambulatory Visit: Payer: Self-pay | Admitting: Family Medicine

## 2013-06-25 DIAGNOSIS — R42 Dizziness and giddiness: Secondary | ICD-10-CM | POA: Insufficient documentation

## 2013-06-25 NOTE — Assessment & Plan Note (Addendum)
Patient complaining of dizziness and lightheadedness with standing.  Orthostatics are positive in clinic.  Discussed case with NP Peggyann Juba. Will reduce metoprolol to 12.5 mg BID.  Encourage adequate fluid intake. Monitor BP at home. Discussed importance of sitting for a few minutes before standing when getting out of bed.  Count to ten after standing before walking when trying to get up.  Follow-up in 2 weeks with myself or Dr. Abner Greenspan.

## 2013-06-25 NOTE — Assessment & Plan Note (Addendum)
Patient complaining of dizziness and lightheadedness with standing.  Orthostatics are positive in clinic.  Discussed case with NP O'Sullivan. Will reduce metoprolol to 12.5 mg BID.  Encourage adequate fluid intake. Monitor BP at home. Discussed importance of sitting for a few minutes before standing when getting out of bed.  Count to ten after standing before walking when trying to get up.  Follow-up in 2 weeks with myself or Dr. Blyth. 

## 2013-06-26 NOTE — Telephone Encounter (Signed)
Request from pharmacy for atorvastatin 20mg .  Current med list states 10mg  once a day. Attempted to reach pt for verification and line is busy.

## 2013-06-28 ENCOUNTER — Other Ambulatory Visit: Payer: Self-pay | Admitting: Family Medicine

## 2013-06-29 NOTE — Telephone Encounter (Signed)
Left message on home # to return my call. 

## 2013-06-30 MED ORDER — ATORVASTATIN CALCIUM 20 MG PO TABS: 20.0000 mg | ORAL_TABLET | Freq: Every day | ORAL | Status: DC

## 2013-06-30 NOTE — Telephone Encounter (Signed)
Pt called stating that she is taking 20 mg. RX sent

## 2013-07-04 ENCOUNTER — Other Ambulatory Visit: Payer: Self-pay | Admitting: Family Medicine

## 2013-07-05 NOTE — Telephone Encounter (Signed)
I do not see Plavix on pt's med list. Looks like she is on aggrenox.  Please advise.

## 2013-07-07 ENCOUNTER — Encounter: Payer: Self-pay | Admitting: Physician Assistant

## 2013-07-07 ENCOUNTER — Ambulatory Visit (INDEPENDENT_AMBULATORY_CARE_PROVIDER_SITE_OTHER): Payer: Medicare Other | Admitting: Physician Assistant

## 2013-07-07 VITALS — BP 110/72 | HR 68 | Temp 97.9°F | Resp 16 | Ht 59.0 in | Wt 124.5 lb

## 2013-07-07 DIAGNOSIS — R259 Unspecified abnormal involuntary movements: Secondary | ICD-10-CM

## 2013-07-07 DIAGNOSIS — R42 Dizziness and giddiness: Secondary | ICD-10-CM

## 2013-07-07 DIAGNOSIS — R251 Tremor, unspecified: Secondary | ICD-10-CM

## 2013-07-07 LAB — CBC WITH DIFFERENTIAL/PLATELET
Eosinophils Relative: 2 % (ref 0–5)
Hemoglobin: 13.4 g/dL (ref 12.0–15.0)
Lymphocytes Relative: 19 % (ref 12–46)
Lymphs Abs: 1.9 10*3/uL (ref 0.7–4.0)
MCH: 29.8 pg (ref 26.0–34.0)
MCV: 90.6 fL (ref 78.0–100.0)
Monocytes Relative: 9 % (ref 3–12)
Neutrophils Relative %: 70 % (ref 43–77)
Platelets: 270 10*3/uL (ref 150–400)
RBC: 4.49 MIL/uL (ref 3.87–5.11)
WBC: 9.6 10*3/uL (ref 4.0–10.5)

## 2013-07-07 NOTE — Patient Instructions (Signed)
Please obtain labs.  I will call you with your results.  Please schedule follow-up with Dr. Abner Greenspan in 2-3 weeks.  If symptoms worsen, please return to clinic sooner or proceed to the emergency department.

## 2013-07-08 ENCOUNTER — Other Ambulatory Visit: Payer: Self-pay | Admitting: Critical Care Medicine

## 2013-07-08 DIAGNOSIS — R251 Tremor, unspecified: Secondary | ICD-10-CM | POA: Insufficient documentation

## 2013-07-08 LAB — BASIC METABOLIC PANEL
CO2: 29 mEq/L (ref 19–32)
Chloride: 104 mEq/L (ref 96–112)
Glucose, Bld: 96 mg/dL (ref 70–99)
Sodium: 139 mEq/L (ref 135–145)

## 2013-07-08 NOTE — Assessment & Plan Note (Signed)
Occasional, and frequent symptoms.  Most likely due to patient's lack of food and water intake. Discussed importance of meals in a patient requiring insulin therapy. Will check CBC and BMP. Follow up with Dr. Abner Greenspan in 3-4 weeks.

## 2013-07-08 NOTE — Progress Notes (Signed)
Patient ID: Briana Ray, female   DOB: January 17, 1924, 79 y.o.   MRN: [id number]  Patient presents today for two-week followup orthostatic lightheadedness.  At last visit, patient complained of lightheadedness and dizziness with standing. Orthostatic vitals were obtained. Patient was found to have orthostatic hypotension. At that time patient was taking metoprolol 50 mg 24 hours tablet twice a day.  Patient denied syncope, chest pain, heart palpitations et Karie Soda.  At last visit, we decreased metoprolol to 25 mg twice a day.  Patient's blood pressure today is 110/72. Patient states that her dizziness is much improved.  Still denies fall, chest pain, heart palpitations.  States she has felt a little shaky occasionally. Patient is a diabetic. Patient endorses she doesn't always eat or drink as much as she should.     Past Medical History  Diagnosis Date  . Coronary heart disease   . CVA (cerebral infarction) 04/25/10    MRI L parietal lobe infarction   . Hyperlipidemia   . Diabetes mellitus, type 2   . GERD (gastroesophageal reflux disease)   . 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 Rheumatoid arthritis(714.0)     on Arava. Dx for 20 yrs  . Aneurysm     "on my heart; dr said I didn't have to worry about it, it's not big enough yet"  . Hypertension   . Myocardial infarction 1998; 2005  . Pneumonia 11/16/11    "first time ever"  . Anemia   . Blood transfusion   . Stroke   . Diverticulitis 2008    Current Outpatient Prescriptions on File Prior to Visit  Medication Sig Dispense Refill  . atorvastatin (LIPITOR) 20 MG tablet Take 1 tablet (20 mg total) by mouth daily.  30 tablet  2  . dipyridamole-aspirin (AGGRENOX) 200-25 MG per 12 hr capsule Take 1 capsule by mouth 2 (two) times daily.      Marland Kitchen glipiZIDE (GLUCOTROL XL) 2.5 MG 24 hr tablet Take 2.5 mg by mouth daily.        . hydroxychloroquine (PLAQUENIL) 200 MG tablet Take 300 mg by mouth daily.      Marland Kitchen  leflunomide (ARAVA) 20 MG tablet Take 10 mg by mouth daily.       Marland Kitchen loperamide (IMODIUM A-D) 2 MG tablet Take 2 mg by mouth as needed. For diaherra      . metoprolol succinate (TOPROL-XL) 25 MG 24 hr tablet Take 1 tablet (25 mg total) by mouth 2 (two) times daily.  30 tablet  2  . Multiple Vitamins-Minerals (CENTRUM SILVER PO) Take 1 tablet by mouth daily.       . nitroGLYCERIN (NITROSTAT) 0.4 MG SL tablet Place 1 tablet (0.4 mg total) under the tongue every 5 (five) minutes as needed. For chest pain  25 tablet  6  . Omega-3 Fatty Acids (FISH OIL) 1200 MG CAPS Take 1 capsule by mouth daily.       . OXYGEN-HELIUM IN Inhale 2.5 L/hr into the lungs at bedtime.      . SYMBICORT 160-4.5 MCG/ACT inhaler INHALE TWO (2) PUFF(S) BY MOUTH TWICE DAILY ** RINSE MOUTH AF TER EACH USE  1 Inhaler  5  . traMADol (ULTRAM) 50 MG tablet Take 1 tablet (50 mg total) by mouth every 6 (six) hours as needed for pain.  15 tablet  0   No current facility-administered medications on file prior to visit.    Allergies  Allergen Reactions  .  Amoxicillin-Pot Clavulanate     REACTION: diarrhea    Family History  Problem Relation Age of Onset  . Arthritis Mother   . Other Brother     lung issues- oxygen  . Pneumonia Brother   . Diabetes Sister   . Hyperlipidemia Sister   . Hypertension Sister   . Stroke Sister   . Kidney disease Father   . Fibromyalgia Daughter   . Cancer Son     colon  . Cancer Sister     colon?  . Dementia Sister   . Alcohol abuse Brother   . Aneurysm Brother   . Heart disease Brother     History   Social History  . Marital Status: Widowed    Spouse Name: N/A    Number of Children: N/A  . Years of Education: N/A   Occupational History  . Retired     [employer]   Social History Main Topics  . Smoking status: Never Smoker   . Smokeless tobacco: Never Used  . Alcohol Use: No     Comment: rare  . Drug Use: No  . Sexual Activity: Not Currently   Other Topics Concern  .  None   Social History Narrative  . None   ROS See history of present illness. All other review of systems are negative.   Filed Vitals:   07/07/13 1451  BP: 110/72  Pulse: 68  Temp: 97.9 F (36.6 C)  Resp: 16   Physical Exam  Vitals reviewed. Constitutional: She is oriented to person, place, and time and well-developed, well-nourished, and in no distress.  HENT:  Head: Normocephalic and atraumatic.  Eyes: Conjunctivae are normal.  Neck: Neck supple.  Cardiovascular: Normal rate and regular rhythm.   Murmur heard.  Systolic murmur is present with a grade of 2/6  This is not a new murmur.  Pulmonary/Chest: Effort normal and breath sounds normal.  Neurological: She is alert and oriented to person, place, and time.  Skin: Skin is warm and dry. No rash noted.  Psychiatric: Affect normal.     Recent Results (from the past 2160 hour(s))  CBC WITH DIFFERENTIAL     Status: None   Collection Time    07/07/13  3:53 PM      Result Value Range   WBC 9.6  4.0 - 10.5 K/uL   RBC 4.49  3.87 - 5.11 MIL/uL   Hemoglobin 13.4  12.0 - 15.0 g/dL   HCT 16.1  09.6 - 04.5 %   MCV 90.6  78.0 - 100.0 fL   MCH 29.8  26.0 - 34.0 pg   MCHC 32.9  30.0 - 36.0 g/dL   RDW 40.9  81.1 - 91.4 %   Platelets 270  150 - 400 K/uL   Neutrophils Relative % 70  43 - 77 %   Neutro Abs 6.6  1.7 - 7.7 K/uL   Lymphocytes Relative 19  12 - 46 %   Lymphs Abs 1.9  0.7 - 4.0 K/uL   Monocytes Relative 9  3 - 12 %   Monocytes Absolute 0.9  0.1 - 1.0 K/uL   Eosinophils Relative 2  0 - 5 %   Eosinophils Absolute 0.2  0.0 - 0.7 K/uL   Basophils Relative 0  0 - 1 %   Basophils Absolute 0.0  0.0 - 0.1 K/uL   Smear Review Criteria for review not met    BASIC METABOLIC PANEL     Status: None   Collection Time  07/07/13  3:53 PM      Result Value Range   Sodium 139  135 - 145 mEq/L   Potassium 4.6  3.5 - 5.3 mEq/L   Chloride 104  96 - 112 mEq/L   CO2 29  19 - 32 mEq/L   Glucose, Bld 96  70 - 99 mg/dL   BUN  19  6 - 23 mg/dL   Creat 4.09  8.11 - 9.14 mg/dL   Calcium 9.3  8.4 - 78.2 mg/dL    Assessment/Plan: Orthostatic lightheadedness Much improved with decrease in antihypertensive medication dosage. We'll continue current management. Patient to followup in 3-4 weeks with PCP Dr. Abner Greenspan.  Shakiness Occasional, and frequent symptoms.  Most likely due to patient's lack of food and water intake. Discussed importance of meals in a patient requiring insulin therapy. Will check CBC and BMP. Follow up with Dr. Abner Greenspan in 3-4 weeks.

## 2013-07-08 NOTE — Assessment & Plan Note (Signed)
Much improved with decrease in antihypertensive medication dosage. We'll continue current management. Patient to followup in 3-4 weeks with PCP Dr. Abner Greenspan.

## 2013-07-09 MED ORDER — METOPROLOL TARTRATE 25 MG PO TABS: 12.5000 mg | ORAL_TABLET | Freq: Two times a day (BID) | ORAL | Status: AC

## 2013-07-09 NOTE — Addendum Note (Signed)
Addended by: Marcelline Mates on: 07/09/2013 05:54 PM   Modules accepted: Orders, Medications

## 2013-07-10 ENCOUNTER — Telehealth: Payer: Self-pay | Admitting: Physician Assistant

## 2013-07-10 NOTE — Telephone Encounter (Signed)
Discussed results with patient

## 2013-07-18 ENCOUNTER — Other Ambulatory Visit: Payer: Self-pay | Admitting: Family Medicine

## 2013-07-18 NOTE — Telephone Encounter (Signed)
I don't see where patient takes this medication? Please advise?

## 2013-07-19 NOTE — Telephone Encounter (Signed)
So I assume she just forgot to let us know she was on this. Check with pharmacy and make sure she has been on this dose awhile then refill for 3 mnths. Make sure she has labs done by then with a TSH for surveillance

## 2013-07-21 ENCOUNTER — Encounter: Payer: Self-pay | Admitting: Family Medicine

## 2013-07-21 ENCOUNTER — Ambulatory Visit (INDEPENDENT_AMBULATORY_CARE_PROVIDER_SITE_OTHER): Payer: Medicare Other | Admitting: Family Medicine

## 2013-07-21 VITALS — BP 138/86 | HR 83 | Temp 97.6°F | Ht 59.0 in | Wt 124.1 lb

## 2013-07-21 DIAGNOSIS — R5381 Other malaise: Secondary | ICD-10-CM

## 2013-07-21 DIAGNOSIS — I10 Essential (primary) hypertension: Secondary | ICD-10-CM

## 2013-07-21 DIAGNOSIS — I2510 Atherosclerotic heart disease of native coronary artery without angina pectoris: Secondary | ICD-10-CM

## 2013-07-21 DIAGNOSIS — Z23 Encounter for immunization: Secondary | ICD-10-CM

## 2013-07-21 DIAGNOSIS — E119 Type 2 diabetes mellitus without complications: Secondary | ICD-10-CM

## 2013-07-21 DIAGNOSIS — R350 Frequency of micturition: Secondary | ICD-10-CM

## 2013-07-21 DIAGNOSIS — R269 Unspecified abnormalities of gait and mobility: Secondary | ICD-10-CM

## 2013-07-21 DIAGNOSIS — R2681 Unsteadiness on feet: Secondary | ICD-10-CM

## 2013-07-21 MED ORDER — LEVOTHYROXINE SODIUM 50 MCG PO TABS: 50.0000 ug | ORAL_TABLET | Freq: Every day | ORAL | Status: AC

## 2013-07-21 NOTE — Patient Instructions (Signed)
Urinary Tract Infection  Urinary tract infections (UTIs) can develop anywhere along your urinary tract. Your urinary tract is your body's drainage system for removing wastes and extra water. Your urinary tract includes two kidneys, two ureters, a bladder, and a urethra. Your kidneys are a pair of bean-shaped organs. Each kidney is about the size of your fist. They are located below your ribs, one on each side of your spine.  CAUSES  Infections are caused by microbes, which are microscopic organisms, including fungi, viruses, and bacteria. These organisms are so small that they can only be seen through a microscope. Bacteria are the microbes that most commonly cause UTIs.  SYMPTOMS   Symptoms of UTIs may vary by age and gender of the patient and by the location of the infection. Symptoms in young women typically include a frequent and intense urge to urinate and a painful, burning feeling in the bladder or urethra during urination. Older women and men are more likely to be tired, shaky, and weak and have muscle aches and abdominal pain. A fever may mean the infection is in your kidneys. Other symptoms of a kidney infection include pain in your back or sides below the ribs, nausea, and vomiting.  DIAGNOSIS  To diagnose a UTI, your caregiver will ask you about your symptoms. Your caregiver also will ask to provide a urine sample. The urine sample will be tested for bacteria and white blood cells. White blood cells are made by your body to help fight infection.  TREATMENT   Typically, UTIs can be treated with medication. Because most UTIs are caused by a bacterial infection, they usually can be treated with the use of antibiotics. The choice of antibiotic and length of treatment depend on your symptoms and the type of bacteria causing your infection.  HOME CARE INSTRUCTIONS   If you were prescribed antibiotics, take them exactly as your caregiver instructs you. Finish the medication even if you feel better after you  have only taken some of the medication.   Drink enough water and fluids to keep your urine clear or pale yellow.   Avoid caffeine, tea, and carbonated beverages. They tend to irritate your bladder.   Empty your bladder often. Avoid holding urine for long periods of time.   Empty your bladder before and after sexual intercourse.   After a bowel movement, women should cleanse from front to back. Use each tissue only once.  SEEK MEDICAL CARE IF:    You have back pain.   You develop a fever.   Your symptoms do not begin to resolve within 3 days.  SEEK IMMEDIATE MEDICAL CARE IF:    You have severe back pain or lower abdominal pain.   You develop chills.   You have nausea or vomiting.   You have continued burning or discomfort with urination.  MAKE SURE YOU:    Understand these instructions.   Will watch your condition.   Will get help right away if you are not doing well or get worse.  Document Released: 06/24/2005 Document Revised: 03/15/2012 Document Reviewed: 10/23/2011  [employer] Patient Information 2014 [employer]

## 2013-07-21 NOTE — Telephone Encounter (Signed)
Patient states she is taking Levothyroxine 50 mcg daily. RX sent

## 2013-07-21 NOTE — Addendum Note (Signed)
Addended by: Court Joy on: 07/21/2013 08:11 AM   Modules accepted: Orders

## 2013-07-22 LAB — URINALYSIS
Bilirubin Urine: NEGATIVE
Glucose, UA: NEGATIVE mg/dL
Hgb urine dipstick: NEGATIVE
Ketones, ur: NEGATIVE mg/dL
Leukocytes, UA: NEGATIVE
Nitrite: NEGATIVE
Protein, ur: NEGATIVE mg/dL
Specific Gravity, Urine: 1.018 (ref 1.005–1.030)
pH: 5.5 (ref 5.0–8.0)

## 2013-07-23 ENCOUNTER — Encounter: Payer: Self-pay | Admitting: Family Medicine

## 2013-07-23 DIAGNOSIS — R5381 Other malaise: Secondary | ICD-10-CM

## 2013-07-23 HISTORY — DX: Other malaise: R53.81

## 2013-07-23 LAB — URINE CULTURE: Colony Count: 50000

## 2013-07-23 NOTE — Assessment & Plan Note (Signed)
No recent events or c/o cp

## 2013-07-23 NOTE — Assessment & Plan Note (Signed)
Well controlled, no changes to meds 

## 2013-07-23 NOTE — Assessment & Plan Note (Signed)
Ordered a course of physical therapy to help with her strength and unsteady gait.

## 2013-07-23 NOTE — Progress Notes (Signed)
Patient ID: Briana Ray, female   DOB: 26-Jan-1924, 79 y.o.   MRN: [id number] Briana Ray [id number] 09-18-1924 07/23/2013      Progress Note-Follow Up  Subjective  Chief Complaint  Chief Complaint  Patient presents with  . Follow-up    2-3 week    HPI  Patient is an 79 year old Caucasian female who is here today in followup. She has not had any episodes of syncope or falls. She feels somewhat shaky but acknowledges still feeling unsteady and weak frequently. Denies chest pain or palpitations no shortness of breath GI or GU complaints. Probiotics been helpful and she is having less loose irritated stool. She does also use Imodium intermittently. She's had no new neurologic events or trips in the emergency room.  Past Medical History  Diagnosis Date  . Coronary heart disease   . CVA (cerebral infarction) 04/25/10    MRI L parietal lobe infarction   . Hyperlipidemia   . Diabetes mellitus, type 2   . GERD (gastroesophageal reflux disease)   . 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 Rheumatoid arthritis(714.0)     on Arava. Dx for 20 yrs  . Aneurysm     "on my heart; dr said I didn't have to worry about it, it's not big enough yet"  . Hypertension   . Myocardial infarction 1998; 2005  . Pneumonia 11/16/11    "first time ever"  . Anemia   . Blood transfusion   . Stroke   . Diverticulitis 2008  . Debility 07/23/2013    Past Surgical History  Procedure Laterality Date  . Breast reduction surgery    . Cholecystectomy    . Appendectomy    . Tonsillectomy    . Vesicovaginal fistula closure w/ tah    . Carpal tunnel release      left  . Coronary angioplasty with stent placement  ~ 2002    "1"  . Hernia repair      umbilical  . Abdominal hysterectomy    . Cataract extraction w/ intraocular lens  implant, bilateral    . Coronary artery bypass graft  1998    vCABG X4 w/alve repair  . Breast surgery      reduction     Family History  Problem Relation Age of Onset  . Arthritis Mother   . Other Brother     lung issues- oxygen  . Pneumonia Brother   . Diabetes Sister   . Hyperlipidemia Sister   . Hypertension Sister   . Stroke Sister   . Kidney disease Father   . Fibromyalgia Daughter   . Cancer Son     colon  . Cancer Sister     colon?  . Dementia Sister   . Alcohol abuse Brother   . Aneurysm Brother   . Heart disease Brother     History   Social History  . Marital Status: Widowed    Spouse Name: N/A    Number of Children: N/A  . Years of Education: N/A   Occupational History  . Retired     [employer]   Social History Main Topics  . Smoking status: Never Smoker   . Smokeless tobacco: Never Used  . Alcohol Use: No     Comment: rare  . Drug Use: No  . Sexual Activity: Not Currently   Other Topics Concern  . Not on file   Social History Narrative  . No narrative  on file    Current Outpatient Prescriptions on File Prior to Visit  Medication Sig Dispense Refill  . atorvastatin (LIPITOR) 20 MG tablet Take 1 tablet (20 mg total) by mouth daily.  30 tablet  2  . dipyridamole-aspirin (AGGRENOX) 200-25 MG per 12 hr capsule Take 1 capsule by mouth 2 (two) times daily.      Marland Kitchen glipiZIDE (GLUCOTROL XL) 2.5 MG 24 hr tablet Take 2.5 mg by mouth daily.        . hydroxychloroquine (PLAQUENIL) 200 MG tablet Take 300 mg by mouth daily.      Marland Kitchen leflunomide (ARAVA) 20 MG tablet Take 10 mg by mouth daily.       Marland Kitchen levothyroxine (SYNTHROID, LEVOTHROID) 50 MCG tablet Take 1 tablet (50 mcg total) by mouth daily before breakfast.  30 tablet  4  . loperamide (IMODIUM A-D) 2 MG tablet Take 2 mg by mouth as needed. For diaherra      . metoprolol tartrate (LOPRESSOR) 25 MG tablet Take 0.5 tablets (12.5 mg total) by mouth 2 (two) times daily.  180 tablet  3  . Multiple Vitamins-Minerals (CENTRUM SILVER PO) Take 1 tablet by mouth daily.       . nitroGLYCERIN (NITROSTAT) 0.4 MG SL tablet Place 1  tablet (0.4 mg total) under the tongue every 5 (five) minutes as needed. For chest pain  25 tablet  6  . Omega-3 Fatty Acids (FISH OIL) 1200 MG CAPS Take 1 capsule by mouth daily.       . OXYGEN-HELIUM IN Inhale 2.5 L/hr into the lungs at bedtime.      . SYMBICORT 160-4.5 MCG/ACT inhaler INHALE 2 PUFFS BY MOUTH TWICE DAILY. RINSE MOUTH AFTER EACH USE  10.2 g  6   No current facility-administered medications on file prior to visit.    Allergies  Allergen Reactions  . Amoxicillin-Pot Clavulanate     REACTION: diarrhea    Review of Systems  Review of Systems  Constitutional: Negative for fever and malaise/fatigue.  HENT: Negative for congestion.   Eyes: Negative for discharge.  Respiratory: Negative for shortness of breath.   Cardiovascular: Negative for chest pain, palpitations and leg swelling.  Gastrointestinal: Negative for nausea, abdominal pain and diarrhea.  Genitourinary: Negative for dysuria.  Musculoskeletal: Negative for falls.  Skin: Negative for rash.  Neurological: Negative for loss of consciousness and headaches.  Endo/Heme/Allergies: Negative for polydipsia.  Psychiatric/Behavioral: Negative for depression and suicidal ideas. The patient is not nervous/anxious and does not have insomnia.     Objective  BP 138/86  Pulse 83  Temp(Src) 97.6 F (36.4 C) (Oral)  Ht 4\' 11"  (1.499 m)  Wt 124 lb 1.3 oz (56.282 kg)  BMI 25.05 kg/m2  SpO2 93%  Physical Exam  Physical Exam  Constitutional: She is oriented to person, place, and time and well-developed, well-nourished, and in no distress. No distress.  frail  HENT:  Head: Normocephalic and atraumatic.  Eyes: Conjunctivae are normal.  Neck: Neck supple. No thyromegaly present.  Cardiovascular: Normal rate, regular rhythm and normal heart sounds.   Pulmonary/Chest: Effort normal and breath sounds normal. She has no wheezes.  Abdominal: She exhibits no distension and no mass.  Musculoskeletal: She exhibits no edema.   Lymphadenopathy:    She has no cervical adenopathy.  Neurological: She is alert and oriented to person, place, and time.  Skin: Skin is warm and dry. No rash noted. She is not diaphoretic.  Psychiatric: Memory, affect and judgment normal.    No  results found for this basename: TSH   Lab Results  Component Value Date   WBC 9.6 07/07/2013   HGB 13.4 07/07/2013   HCT 40.7 07/07/2013   MCV 90.6 07/07/2013   PLT 270 07/07/2013   Lab Results  Component Value Date   CREATININE 0.92 07/07/2013   BUN 19 07/07/2013   NA 139 07/07/2013   K 4.6 07/07/2013   CL 104 07/07/2013   CO2 29 07/07/2013   Lab Results  Component Value Date   ALT 27 08/10/2012   AST 33 08/10/2012   ALKPHOS 100 08/10/2012   BILITOT 0.6 08/10/2012   Lab Results  Component Value Date   CHOL 163 08/10/2012   Lab Results  Component Value Date   HDL 42 08/10/2012   Lab Results  Component Value Date   LDLCALC 85 08/10/2012   Lab Results  Component Value Date   TRIG 181* 08/10/2012   Lab Results  Component Value Date   CHOLHDL 3.9 08/10/2012     Assessment & Plan  Debility Ordered a course of physical therapy to help with her strength and unsteady gait.   DIABETES MELLITUS, TYPE II Well controlled, reminded not to skip meals, eat small frequent meals with complex carbs and lean proteins.  CORONARY HEART DISEASE No recent events or c/o cp  HYPERTENSION Well controlled, no changes to meds

## 2013-07-23 NOTE — Assessment & Plan Note (Signed)
Well controlled, reminded not to skip meals, eat small frequent meals with complex carbs and lean proteins.

## 2013-07-25 ENCOUNTER — Encounter: Payer: Self-pay | Admitting: Family Medicine

## 2013-07-25 NOTE — Addendum Note (Signed)
Addended by: Court Joy on: 07/25/2013 06:33 PM   Modules accepted: Orders

## 2013-07-25 NOTE — Progress Notes (Signed)
Quick Note:  Patient Informed and voiced understanding.  Labs ordered ______ 

## 2013-07-27 ENCOUNTER — Other Ambulatory Visit: Payer: Self-pay

## 2013-07-27 ENCOUNTER — Ambulatory Visit: Payer: Medicare Other | Attending: Family Medicine | Admitting: Physical Therapy

## 2013-07-27 DIAGNOSIS — R269 Unspecified abnormalities of gait and mobility: Secondary | ICD-10-CM | POA: Insufficient documentation

## 2013-07-27 DIAGNOSIS — IMO0001 Reserved for inherently not codable concepts without codable children: Secondary | ICD-10-CM | POA: Insufficient documentation

## 2013-07-27 DIAGNOSIS — M6281 Muscle weakness (generalized): Secondary | ICD-10-CM | POA: Insufficient documentation

## 2013-07-27 MED ORDER — GLIPIZIDE ER 2.5 MG PO TB24: 2.5000 mg | ORAL_TABLET | Freq: Every day | ORAL | Status: DC

## 2013-07-28 ENCOUNTER — Other Ambulatory Visit: Payer: Self-pay

## 2013-07-28 ENCOUNTER — Telehealth: Payer: Self-pay

## 2013-07-28 LAB — URINALYSIS
Bilirubin Urine: NEGATIVE
Ketones, ur: NEGATIVE mg/dL
Nitrite: NEGATIVE
Protein, ur: NEGATIVE mg/dL
Specific Gravity, Urine: 1.017 (ref 1.005–1.030)
Urobilinogen, UA: 0.2 mg/dL (ref 0.0–1.0)
pH: 5.5 (ref 5.0–8.0)

## 2013-07-28 MED ORDER — GLIPIZIDE ER 2.5 MG PO TB24: 2.5000 mg | ORAL_TABLET | Freq: Every day | ORAL | Status: AC

## 2013-07-28 NOTE — Telephone Encounter (Signed)
Message copied by Eulis Manly on Fri Jul 28, 2013  5:07 PM ------      Message from: Danise Edge A      Created: Fri Jul 28, 2013  3:22 PM       Notify urine neg ------

## 2013-07-28 NOTE — Telephone Encounter (Signed)
Patient informed of results.  

## 2013-07-29 LAB — CULTURE, URINE COMPREHENSIVE

## 2013-07-31 ENCOUNTER — Ambulatory Visit: Payer: Medicare Other | Attending: Family Medicine | Admitting: Physical Therapy

## 2013-07-31 DIAGNOSIS — IMO0001 Reserved for inherently not codable concepts without codable children: Secondary | ICD-10-CM | POA: Insufficient documentation

## 2013-07-31 DIAGNOSIS — R269 Unspecified abnormalities of gait and mobility: Secondary | ICD-10-CM | POA: Insufficient documentation

## 2013-07-31 DIAGNOSIS — M6281 Muscle weakness (generalized): Secondary | ICD-10-CM | POA: Insufficient documentation

## 2013-08-02 ENCOUNTER — Ambulatory Visit: Payer: Medicare Other | Admitting: Physical Therapy

## 2013-08-07 ENCOUNTER — Ambulatory Visit: Payer: Medicare Other | Admitting: Physical Therapy

## 2013-08-09 ENCOUNTER — Ambulatory Visit: Payer: Medicare Other | Admitting: Rehabilitation

## 2013-08-10 ENCOUNTER — Encounter: Payer: Medicare Other | Admitting: Physical Therapy

## 2013-08-14 ENCOUNTER — Ambulatory Visit: Payer: Medicare Other | Admitting: Rehabilitation

## 2013-08-16 ENCOUNTER — Ambulatory Visit: Payer: Medicare Other | Admitting: Rehabilitation

## 2013-08-17 ENCOUNTER — Encounter: Payer: Medicare Other | Admitting: Physical Therapy

## 2013-08-21 ENCOUNTER — Ambulatory Visit: Payer: Medicare Other | Admitting: Physical Therapy

## 2013-08-23 ENCOUNTER — Ambulatory Visit: Payer: Medicare Other | Admitting: Rehabilitation

## 2013-08-28 ENCOUNTER — Encounter: Payer: Self-pay | Admitting: Physician Assistant

## 2013-08-28 ENCOUNTER — Ambulatory Visit: Payer: Medicare Other | Admitting: Rehabilitation

## 2013-08-28 ENCOUNTER — Ambulatory Visit (INDEPENDENT_AMBULATORY_CARE_PROVIDER_SITE_OTHER): Payer: Medicare Other | Admitting: Physician Assistant

## 2013-08-28 VITALS — BP 137/85 | HR 68 | Temp 98.1°F | Resp 14 | Ht 59.0 in | Wt 123.5 lb

## 2013-08-28 DIAGNOSIS — R42 Dizziness and giddiness: Secondary | ICD-10-CM

## 2013-08-28 DIAGNOSIS — W19XXXS Unspecified fall, sequela: Secondary | ICD-10-CM

## 2013-08-28 LAB — CBC WITH DIFFERENTIAL/PLATELET
Basophils Absolute: 0 10*3/uL (ref 0.0–0.1)
Basophils Relative: 0 % (ref 0–1)
Eosinophils Absolute: 0.2 10*3/uL (ref 0.0–0.7)
HCT: 38.5 % (ref 36.0–46.0)
Hemoglobin: 12.8 g/dL (ref 12.0–15.0)
Lymphs Abs: 1.6 10*3/uL (ref 0.7–4.0)
MCH: 30.7 pg (ref 26.0–34.0)
MCHC: 33.2 g/dL (ref 30.0–36.0)
MCV: 92.3 fL (ref 78.0–100.0)
Monocytes Absolute: 1 10*3/uL (ref 0.1–1.0)
Monocytes Relative: 13 % — ABNORMAL HIGH (ref 3–12)
Neutrophils Relative %: 64 % (ref 43–77)
RBC: 4.17 MIL/uL (ref 3.87–5.11)
RDW: 14.9 % (ref 11.5–15.5)

## 2013-08-28 LAB — BASIC METABOLIC PANEL
BUN: 18 mg/dL (ref 6–23)
CO2: 27 mEq/L (ref 19–32)
Calcium: 9.4 mg/dL (ref 8.4–10.5)
Glucose, Bld: 95 mg/dL (ref 70–99)

## 2013-08-28 LAB — GLUCOSE, POCT (MANUAL RESULT ENTRY): POC Glucose: 136 mg/dl — AB (ref 70–99)

## 2013-08-28 NOTE — Progress Notes (Signed)
Pre visit review using our clinic review tool, if applicable. No additional management support is needed unless otherwise documented below in the visit note/SLS  

## 2013-08-28 NOTE — Patient Instructions (Signed)
Continue medications as prescribed, making sure to stay well-hydrated.  You should attempt to eat a small meal every 3 hours to keep your blood sugars stable.  You should avoid missing meals while on your diabetes medication.  Please schedule an appointment with Dr. Jens Som, your cardiologist.  We will schedule an MRI of the brain giving your history of stroke to make sure everything looks ok.

## 2013-08-29 ENCOUNTER — Telehealth: Payer: Self-pay | Admitting: *Deleted

## 2013-08-29 NOTE — Telephone Encounter (Signed)
Patient requested a refill on aggrenox. She stated that she doesn't know when she took it last or even when she filled it last. Is this ok to fill now or wait until her appointment on 08/31/13? Please advise. Thanks, MI

## 2013-08-30 ENCOUNTER — Ambulatory Visit: Payer: Medicare Other | Admitting: Physical Therapy

## 2013-08-30 NOTE — Telephone Encounter (Signed)
Not on mrd list from 11/13. Would not fill until seen.

## 2013-08-30 NOTE — Telephone Encounter (Signed)
Patient aware.

## 2013-08-30 NOTE — Progress Notes (Signed)
Patient ID: Briana Ray, female   DOB: 04-12-1924, 79 y.o.   MRN: [id number]  Patient presents to clinic today c/o lightheadedness with fall 2 days ago w/o injury.  Patient states she felt lightheaded when getting up from her seat at church.  She went to walk outside and stumbled on the concrete, falling on the ground.  Denies trauma or injury.  Was able to get back up with help from church members.  Patient denies fall since then.  Patient has history of diabetes, orthostatic lightheadedness and debility.  Patient endorses taking DM medication as prescribed.  She endorses good fluid intake and not skipping meals.  Fingerstick glucose 138 in clinic today.  Orthostatics are negative.  Patient was recently seen by Dr. Abner Greenspan for debility and set up with PT to help with ambulation and unsteadiness.  Patient endorses going to scheduled sessions.  Patient denies chest pain, palpitations, vertigo or syncope.   Patient does have history of CVA in the past.  Patient is followed by Cardiology but has not been seen in over a year.  Patient does not have any follow-up with neurology.   Past Medical History  Diagnosis Date  . Coronary heart disease   . CVA (cerebral infarction) 04/25/10    MRI L parietal lobe infarction   . Hyperlipidemia   . Diabetes mellitus, type 2   . GERD (gastroesophageal reflux disease)   . 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 Rheumatoid arthritis(714.0)     on Arava. Dx for 20 yrs  . Aneurysm     "on my heart; dr said I didn't have to worry about it, it's not big enough yet"  . Hypertension   . Myocardial infarction 1998; 2005  . Pneumonia 11/16/11    "first time ever"  . Anemia   . Blood transfusion   . Stroke   . Diverticulitis 2008  . Debility 07/23/2013    Current Outpatient Prescriptions on File Prior to Visit  Medication Sig Dispense Refill  . atorvastatin (LIPITOR) 20 MG tablet Take 1 tablet (20 mg total) by mouth daily.   30 tablet  2  . dipyridamole-aspirin (AGGRENOX) 200-25 MG per 12 hr capsule Take 1 capsule by mouth 2 (two) times daily.      Marland Kitchen glipiZIDE (GLUCOTROL XL) 2.5 MG 24 hr tablet Take 1 tablet (2.5 mg total) by mouth daily.  30 tablet  5  . hydroxychloroquine (PLAQUENIL) 200 MG tablet Take 300 mg by mouth daily.      Marland Kitchen leflunomide (ARAVA) 20 MG tablet Take 10 mg by mouth daily.       Marland Kitchen levothyroxine (SYNTHROID, LEVOTHROID) 50 MCG tablet Take 1 tablet (50 mcg total) by mouth daily before breakfast.  30 tablet  4  . loperamide (IMODIUM A-D) 2 MG tablet Take 2 mg by mouth as needed. For diaherra      . metoprolol tartrate (LOPRESSOR) 25 MG tablet Take 0.5 tablets (12.5 mg total) by mouth 2 (two) times daily.  180 tablet  3  . Multiple Vitamins-Minerals (CENTRUM SILVER PO) Take 1 tablet by mouth daily.       . nitroGLYCERIN (NITROSTAT) 0.4 MG SL tablet Place 1 tablet (0.4 mg total) under the tongue every 5 (five) minutes as needed. For chest pain  25 tablet  6  . Omega-3 Fatty Acids (FISH OIL) 1200 MG CAPS Take 1 capsule by mouth daily.       Docia Barrier IN  Inhale 2.5 L/hr into the lungs at bedtime.      . SYMBICORT 160-4.5 MCG/ACT inhaler INHALE 2 PUFFS BY MOUTH TWICE DAILY. RINSE MOUTH AFTER EACH USE  10.2 g  6   No current facility-administered medications on file prior to visit.    Allergies  Allergen Reactions  . Amoxicillin-Pot Clavulanate     REACTION: diarrhea    Family History  Problem Relation Age of Onset  . Arthritis Mother   . Other Brother     lung issues- oxygen  . Pneumonia Brother   . Diabetes Sister   . Hyperlipidemia Sister   . Hypertension Sister   . Stroke Sister   . Kidney disease Father   . Fibromyalgia Daughter   . Cancer Son     colon  . Cancer Sister     colon?  . Dementia Sister   . Alcohol abuse Brother   . Aneurysm Brother   . Heart disease Brother     History   Social History  . Marital Status: Widowed    Spouse Name: N/A    Number of  Children: N/A  . Years of Education: N/A   Occupational History  . Retired     [employer]   Social History Main Topics  . Smoking status: Never Smoker   . Smokeless tobacco: Never Used  . Alcohol Use: No     Comment: rare  . Drug Use: No  . Sexual Activity: Not Currently   Other Topics Concern  . None   Social History Narrative  . None   Review of Systems - see HPI.  All other ROS are negative.  Filed Vitals:   08/28/13 1535 08/28/13 1550 08/28/13 1551  BP: 149/82 140/85 137/85  Pulse: 68    Temp: 98.1 F (36.7 C)    TempSrc: Oral    Resp: 14    Height: 4\' 11"  (1.499 m)    Weight: 123 lb 8 oz (56.019 kg)    SpO2: 97%     Physical Exam  Vitals reviewed. Constitutional: She is oriented to person, place, and time and well-developed, well-nourished, and in no distress.  HENT:  Head: Normocephalic and atraumatic.  Nose: Nose normal.  Mouth/Throat: Oropharynx is clear and moist. No oropharyngeal exudate.  Eyes: Conjunctivae and EOM are normal. Pupils are equal, round, and reactive to light.  Neck: Neck supple.  Cardiovascular: Normal rate, regular rhythm and intact distal pulses.   Murmur noted on exam -- patient with mechanical valve  Pulmonary/Chest: Effort normal and breath sounds normal. No respiratory distress. She has no wheezes. She has no rales. She exhibits no tenderness.  Neurological: She is alert and oriented to person, place, and time. No cranial nerve deficit. Gait normal. Coordination normal. GCS score is 15.  Skin: Skin is warm and dry. No rash noted.  Psychiatric: Affect normal.   Recent Results (from the past 2160 hour(s))  CBC WITH DIFFERENTIAL     Status: None   Collection Time    07/07/13  3:53 PM      Result Value Range   WBC 9.6  4.0 - 10.5 K/uL   RBC 4.49  3.87 - 5.11 MIL/uL   Hemoglobin 13.4  12.0 - 15.0 g/dL   HCT 56.3  87.5 - 64.3 %   MCV 90.6  78.0 - 100.0 fL   MCH 29.8  26.0 - 34.0 pg   MCHC 32.9  30.0 - 36.0 g/dL   RDW 32.9   51.8 - 84.1 %  Platelets 270  150 - 400 K/uL   Neutrophils Relative % 70  43 - 77 %   Neutro Abs 6.6  1.7 - 7.7 K/uL   Lymphocytes Relative 19  12 - 46 %   Lymphs Abs 1.9  0.7 - 4.0 K/uL   Monocytes Relative 9  3 - 12 %   Monocytes Absolute 0.9  0.1 - 1.0 K/uL   Eosinophils Relative 2  0 - 5 %   Eosinophils Absolute 0.2  0.0 - 0.7 K/uL   Basophils Relative 0  0 - 1 %   Basophils Absolute 0.0  0.0 - 0.1 K/uL   Smear Review Criteria for review not met    BASIC METABOLIC PANEL     Status: None   Collection Time    07/07/13  3:53 PM      Result Value Range   Sodium 139  135 - 145 mEq/L   Potassium 4.6  3.5 - 5.3 mEq/L   Chloride 104  96 - 112 mEq/L   CO2 29  19 - 32 mEq/L   Glucose, Bld 96  70 - 99 mg/dL   BUN 19  6 - 23 mg/dL   Creat 8.65  7.84 - 6.96 mg/dL   Calcium 9.3  8.4 - 29.5 mg/dL  URINE CULTURE     Status: None   Collection Time    07/21/13  2:56 PM      Result Value Range   Colony Count 50,000 COLONIES/ML     Organism ID, Bacteria Multiple bacterial morphotypes present, none     Organism ID, Bacteria predominant. Suggest appropriate recollection if      Organism ID, Bacteria clinically indicated.    URINALYSIS     Status: Abnormal   Collection Time    07/21/13  2:58 PM      Result Value Range   Color, Urine YELLOW  YELLOW   APPearance TURBID (*) CLEAR   Specific Gravity, Urine 1.018  1.005 - 1.030   pH 5.5  5.0 - 8.0   Glucose, UA NEG  NEG mg/dL   Bilirubin Urine NEG  NEG   Ketones, ur NEG  NEG mg/dL   Hgb urine dipstick NEG  NEG   Protein, ur NEG  NEG mg/dL   Urobilinogen, UA 0.2  0.0 - 1.0 mg/dL   Nitrite NEG  NEG   Leukocytes, UA NEG  NEG  CULTURE, URINE COMPREHENSIVE     Status: None   Collection Time    07/25/13  2:12 PM      Result Value Range   Colony Count 50,000 COLONIES/ML     Organism ID, Bacteria Multiple bacterial morphotypes present, none     Organism ID, Bacteria predominant. Suggest appropriate recollection if      Organism ID, Bacteria  clinically indicated.    URINALYSIS     Status: None   Collection Time    07/25/13  2:13 PM      Result Value Range   Color, Urine YELLOW  YELLOW   APPearance CLEAR  CLEAR   Specific Gravity, Urine 1.017  1.005 - 1.030   pH 5.5  5.0 - 8.0   Glucose, UA NEG  NEG mg/dL   Bilirubin Urine NEG  NEG   Ketones, ur NEG  NEG mg/dL   Hgb urine dipstick NEG  NEG   Protein, ur NEG  NEG mg/dL   Urobilinogen, UA 0.2  0.0 - 1.0 mg/dL   Nitrite NEG  NEG   Leukocytes, UA  NEG  NEG  GLUCOSE, POCT (MANUAL RESULT ENTRY)     Status: Normal   Collection Time    08/28/13  4:28 PM      Result Value Range   POC Glucose 136 (*) 70 - 99 mg/dl   Comment: Non-fasting  CBC WITH DIFFERENTIAL     Status: Abnormal   Collection Time    08/28/13  5:14 PM      Result Value Range   WBC 8.0  4.0 - 10.5 K/uL   RBC 4.17  3.87 - 5.11 MIL/uL   Hemoglobin 12.8  12.0 - 15.0 g/dL   HCT 28.4  13.2 - 44.0 %   MCV 92.3  78.0 - 100.0 fL   MCH 30.7  26.0 - 34.0 pg   MCHC 33.2  30.0 - 36.0 g/dL   RDW 10.2  72.5 - 36.6 %   Platelets 213  150 - 400 K/uL   Neutrophils Relative % 64  43 - 77 %   Neutro Abs 5.2  1.7 - 7.7 K/uL   Lymphocytes Relative 20  12 - 46 %   Lymphs Abs 1.6  0.7 - 4.0 K/uL   Monocytes Relative 13 (*) 3 - 12 %   Monocytes Absolute 1.0  0.1 - 1.0 K/uL   Eosinophils Relative 3  0 - 5 %   Eosinophils Absolute 0.2  0.0 - 0.7 K/uL   Basophils Relative 0  0 - 1 %   Basophils Absolute 0.0  0.0 - 0.1 K/uL   Smear Review Criteria for review not met    BASIC METABOLIC PANEL     Status: None   Collection Time    08/28/13  5:14 PM      Result Value Range   Sodium 141  135 - 145 mEq/L   Potassium 4.7  3.5 - 5.3 mEq/L   Chloride 106  96 - 112 mEq/L   CO2 27  19 - 32 mEq/L   Glucose, Bld 95  70 - 99 mg/dL   BUN 18  6 - 23 mg/dL   Creat 4.40  3.47 - 4.25 mg/dL   Calcium 9.4  8.4 - 95.6 mg/dL    Assessment/Plan: Lightheadedness Will obtain CBC, BMP.  EKG shows NSR at rate of 68, with incomplete RBBB.    Patient advised to schedule appointment with Cardiology giving PMH, RBBB and overdue for annual follow-up. MRI Brain w/o Contrast giving debility and history of stroke.  Continue with PT for debility.

## 2013-08-30 NOTE — Assessment & Plan Note (Signed)
Will obtain CBC, BMP.  EKG shows NSR at rate of 68, with incomplete RBBB.   Patient advised to schedule appointment with Cardiology giving PMH, RBBB and overdue for annual follow-up. MRI Brain w/o Contrast giving debility and history of stroke.  Continue with PT for debility.

## 2013-08-31 ENCOUNTER — Encounter: Payer: Self-pay | Admitting: Cardiology

## 2013-08-31 ENCOUNTER — Ambulatory Visit (INDEPENDENT_AMBULATORY_CARE_PROVIDER_SITE_OTHER): Payer: Medicare Other | Admitting: Cardiology

## 2013-08-31 ENCOUNTER — Encounter: Payer: Medicare Other | Admitting: Physical Therapy

## 2013-08-31 VITALS — BP 130/70 | HR 70 | Ht 59.0 in | Wt 123.1 lb

## 2013-08-31 DIAGNOSIS — E785 Hyperlipidemia, unspecified: Secondary | ICD-10-CM

## 2013-08-31 DIAGNOSIS — I10 Essential (primary) hypertension: Secondary | ICD-10-CM

## 2013-08-31 DIAGNOSIS — I2510 Atherosclerotic heart disease of native coronary artery without angina pectoris: Secondary | ICD-10-CM

## 2013-08-31 DIAGNOSIS — I714 Abdominal aortic aneurysm, without rupture: Secondary | ICD-10-CM

## 2013-08-31 DIAGNOSIS — Z9889 Other specified postprocedural states: Secondary | ICD-10-CM

## 2013-08-31 DIAGNOSIS — I059 Rheumatic mitral valve disease, unspecified: Secondary | ICD-10-CM

## 2013-08-31 MED ORDER — ASPIRIN EC 81 MG PO TBEC: 81.0000 mg | DELAYED_RELEASE_TABLET | Freq: Every day | ORAL | Status: AC

## 2013-08-31 NOTE — Assessment & Plan Note (Signed)
Continue SBE prophylaxis. Repeat echocardiogram. 

## 2013-08-31 NOTE — Progress Notes (Signed)
HPI: FU coronary artery disease. Status post coronary artery bypass graft and valve repair in 1998. Patient had a LIMA to the LAD, saphenous vein graft to PDA, saphenous vein graft to the intermediate and saphenous vein graft to the marginal. Last cardiac catheterization was performed in 2005 and revealed severe three-vessel obstructive coronary artery disease. Patent saphenous vein graft to the posterior descending artery but with high-grade stenosis at the anastomosis site. Patent left internal mammary artery graft to the left anterior descending. Two occluded vein grafts presumably to intermediate and marginal vessels. Mild left ventricular dysfunction. Successful stenting of the saphenous vein graft to the posterior descending artery at the distal anastomosis. Last echocardiogram performed in September of 2011 showed normal LV function, small pericardial effusion, prior mitral valve repair and trace mitral regurgitation. Last nuclear study in December of 2013 showed an ejection fraction of 60%. There is a prior lateral infarct but no ischemia. Abdominal ultrasound in December of 2013 showed an aneurysm measuring 3.8 x 3.8 cm. Followup recommended in one year. Patient last seen 11/13. Since then, she has not had a chest pain. She has dyspnea on exertion related to pulmonary fibrosis. She has dizziness that can last all day. She has not had syncope. No pedal edema.   Current Outpatient Prescriptions  Medication Sig Dispense Refill  . atorvastatin (LIPITOR) 20 MG tablet Take 1 tablet (20 mg total) by mouth daily.  30 tablet  2  . glipiZIDE (GLUCOTROL XL) 2.5 MG 24 hr tablet Take 1 tablet (2.5 mg total) by mouth daily.  30 tablet  5  . hydroxychloroquine (PLAQUENIL) 200 MG tablet Take 300 mg by mouth daily.      Marland Kitchen leflunomide (ARAVA) 20 MG tablet Take 10 mg by mouth daily.       Marland Kitchen levothyroxine (SYNTHROID, LEVOTHROID) 50 MCG tablet Take 1 tablet (50 mcg total) by mouth daily before breakfast.  30  tablet  4  . loperamide (IMODIUM A-D) 2 MG tablet Take 2 mg by mouth as needed. For diaherra      . metoprolol tartrate (LOPRESSOR) 25 MG tablet Take 0.5 tablets (12.5 mg total) by mouth 2 (two) times daily.  180 tablet  3  . Multiple Vitamins-Minerals (CENTRUM SILVER PO) Take 1 tablet by mouth daily.       . nitroGLYCERIN (NITROSTAT) 0.4 MG SL tablet Place 1 tablet (0.4 mg total) under the tongue every 5 (five) minutes as needed. For chest pain  25 tablet  6  . Omega-3 Fatty Acids (FISH OIL) 1200 MG CAPS Take 1 capsule by mouth daily.       . OXYGEN-HELIUM IN Inhale 2.5 L/hr into the lungs at bedtime.      . SYMBICORT 160-4.5 MCG/ACT inhaler INHALE 2 PUFFS BY MOUTH TWICE DAILY. RINSE MOUTH AFTER EACH USE  10.2 g  6   No current facility-administered medications for this visit.     Past Medical History  Diagnosis Date  . Coronary heart disease   . CVA (cerebral infarction) 04/25/10    MRI L parietal lobe infarction   . Hyperlipidemia   . Diabetes mellitus, type 2   . GERD (gastroesophageal reflux disease)   . 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 Rheumatoid arthritis(714.0)     on Arava. Dx for 20 yrs  . Aneurysm     "on my heart; dr said I didn't have to worry about it, it's not big enough  yet"  . Hypertension   . Myocardial infarction 1998; 2005  . Pneumonia 11/16/11    "first time ever"  . Anemia   . Blood transfusion   . Stroke   . Diverticulitis 2008  . Debility 07/23/2013    Past Surgical History  Procedure Laterality Date  . Breast reduction surgery    . Cholecystectomy    . Appendectomy    . Tonsillectomy    . Vesicovaginal fistula closure w/ tah    . Carpal tunnel release      left  . Coronary angioplasty with stent placement  ~ 2002    "1"  . Hernia repair      umbilical  . Abdominal hysterectomy    . Cataract extraction w/ intraocular lens  implant, bilateral    . Coronary artery bypass graft  1998    vCABG X4 w/alve  repair  . Breast surgery      reduction    History   Social History  . Marital Status: Widowed    Spouse Name: N/A    Number of Children: N/A  . Years of Education: N/A   Occupational History  . Retired     [employer]   Social History Main Topics  . Smoking status: Never Smoker   . Smokeless tobacco: Never Used  . Alcohol Use: No     Comment: rare  . Drug Use: No  . Sexual Activity: Not Currently   Other Topics Concern  . Not on file   Social History Narrative  . No narrative on file    ROS: dizziness, unsteady gait, left neck tightness but no fevers or chills, productive cough, hemoptysis, dysphasia, odynophagia, melena, hematochezia, dysuria, hematuria, rash, seizure activity, orthopnea, PND, pedal edema, claudication. Remaining systems are negative.  Physical Exam: Well-developed well-nourished in no acute distress.  Skin is warm and dry.  HEENT is normal.  Neck is supple.  Chest dry basilar crackles Cardiovascular exam is regular rate and rhythm. 2/6 systolic murmur left sternal border. Abdominal exam nontender or distended. No masses palpated. Extremities show trace edema. neuro grossly intact  ECG sinus rhythm, RV conduction delay.

## 2013-08-31 NOTE — Assessment & Plan Note (Signed)
Continue statin. 

## 2013-08-31 NOTE — Patient Instructions (Signed)
Your physician wants you to follow-up in: ONE YEAR WITH DR Shelda Pal will receive a reminder letter in the mail two months in advance. If you don't receive a letter, please call our office to schedule the follow-up appointment.   Your physician has requested that you have an abdominal aorta duplex. During this test, an ultrasound is used to evaluate the aorta. Allow 30 minutes for this exam. Do not eat after midnight the day before and avoid carbonated beverages   Your physician has requested that you have an echocardiogram. Echocardiography is a painless test that uses sound waves to create images of your heart. It provides your doctor with information about the size and shape of your heart and how well your heart's chambers and valves are working. This procedure takes approximately one hour. There are no restrictions for this procedure.   START ASPIRIN 81 MG ONCE DAILY

## 2013-08-31 NOTE — Assessment & Plan Note (Signed)
Aspirin 81 mg daily. Continue statin.

## 2013-08-31 NOTE — Assessment & Plan Note (Signed)
Plan schedule a followup abdominal ultrasound.

## 2013-08-31 NOTE — Assessment & Plan Note (Signed)
Continue present blood pressure medications. 

## 2013-09-01 ENCOUNTER — Ambulatory Visit: Payer: Medicare Other | Admitting: Family Medicine

## 2013-09-02 ENCOUNTER — Ambulatory Visit (HOSPITAL_BASED_OUTPATIENT_CLINIC_OR_DEPARTMENT_OTHER)
Admission: RE | Admit: 2013-09-02 | Discharge: 2013-09-02 | Disposition: A | Discharge status: Home / Self Care | Payer: Medicare Other | Source: Ambulatory Visit | Attending: Physician Assistant | Admitting: Physician Assistant

## 2013-09-02 ENCOUNTER — Telehealth: Payer: Self-pay | Admitting: Family Medicine

## 2013-09-02 ENCOUNTER — Other Ambulatory Visit (HOSPITAL_BASED_OUTPATIENT_CLINIC_OR_DEPARTMENT_OTHER): Payer: Medicare Other

## 2013-09-02 DIAGNOSIS — I6789 Other cerebrovascular disease: Secondary | ICD-10-CM | POA: Insufficient documentation

## 2013-09-02 DIAGNOSIS — W19XXXS Unspecified fall, sequela: Secondary | ICD-10-CM

## 2013-09-02 DIAGNOSIS — G319 Degenerative disease of nervous system, unspecified: Secondary | ICD-10-CM | POA: Insufficient documentation

## 2013-09-02 DIAGNOSIS — R55 Syncope and collapse: Secondary | ICD-10-CM | POA: Insufficient documentation

## 2013-09-02 DIAGNOSIS — R42 Dizziness and giddiness: Secondary | ICD-10-CM | POA: Insufficient documentation

## 2013-09-02 DIAGNOSIS — Z8673 Personal history of transient ischemic attack (TIA), and cerebral infarction without residual deficits: Secondary | ICD-10-CM | POA: Insufficient documentation

## 2013-09-02 NOTE — Telephone Encounter (Signed)
Can we please call Briana Ray to have her schedule appointment with myself or Melissa since Dr. Abner Greenspan (PCP) is out this week.  The sooner the better.  I would like for her daughter to be present during the visit, if patient allows.

## 2013-09-02 NOTE — Telephone Encounter (Signed)
Call report from radiology - re: MRI today-pt has a new tiny R parietal cva  I called pt and spoke to she and her daughter - overall feeling the same - she has 79 year old L ear numbness and is dizzy only when she gets up in am  She was supposed to be on aggrenox but stopped taking it a while back because she ran out of it - then was inst to get asa 81 mg by her cardiologist yesterday but has not started that yet I inst her to start the 81 mg daily asa today (re affirmed that with her daughter)  Rev s/s of CVA with her in great detail and need to call 911/get to the hospital if symptoms develop She will call Monday to get f/u with PCP , and will not drive / her daughter is staying with her and will not leave her alone

## 2013-09-04 ENCOUNTER — Ambulatory Visit: Payer: Medicare Other | Admitting: Rehabilitation

## 2013-09-04 NOTE — Telephone Encounter (Signed)
Appointment scheduled for 09/05/13 with Sandford Craze.

## 2013-09-05 ENCOUNTER — Ambulatory Visit: Payer: Medicare Other | Admitting: Family

## 2013-09-06 ENCOUNTER — Ambulatory Visit: Payer: Medicare Other | Admitting: Rehabilitation

## 2013-09-07 ENCOUNTER — Encounter: Payer: Medicare Other | Admitting: Rehabilitation

## 2013-09-08 ENCOUNTER — Encounter: Payer: Self-pay | Admitting: Family

## 2013-09-08 ENCOUNTER — Ambulatory Visit (HOSPITAL_BASED_OUTPATIENT_CLINIC_OR_DEPARTMENT_OTHER): Payer: Medicare Other

## 2013-09-08 ENCOUNTER — Encounter: Payer: Self-pay | Admitting: Cardiology

## 2013-09-08 ENCOUNTER — Telehealth: Payer: Self-pay | Admitting: Cardiology

## 2013-09-08 ENCOUNTER — Ambulatory Visit (INDEPENDENT_AMBULATORY_CARE_PROVIDER_SITE_OTHER): Payer: Medicare Other | Admitting: Family

## 2013-09-08 ENCOUNTER — Other Ambulatory Visit: Payer: Self-pay | Admitting: Family

## 2013-09-08 VITALS — BP 148/80 | HR 75 | Temp 98.0°F | Resp 16 | Ht 59.0 in | Wt 122.0 lb

## 2013-09-08 DIAGNOSIS — E785 Hyperlipidemia, unspecified: Secondary | ICD-10-CM | POA: Insufficient documentation

## 2013-09-08 DIAGNOSIS — I7789 Other specified disorders of arteries and arterioles: Secondary | ICD-10-CM | POA: Insufficient documentation

## 2013-09-08 DIAGNOSIS — I2510 Atherosclerotic heart disease of native coronary artery without angina pectoris: Secondary | ICD-10-CM | POA: Insufficient documentation

## 2013-09-08 DIAGNOSIS — I6350 Cerebral infarction due to unspecified occlusion or stenosis of unspecified cerebral artery: Secondary | ICD-10-CM

## 2013-09-08 DIAGNOSIS — I714 Abdominal aortic aneurysm, without rupture, unspecified: Secondary | ICD-10-CM

## 2013-09-08 DIAGNOSIS — I639 Cerebral infarction, unspecified: Secondary | ICD-10-CM

## 2013-09-08 DIAGNOSIS — I10 Essential (primary) hypertension: Secondary | ICD-10-CM | POA: Insufficient documentation

## 2013-09-08 DIAGNOSIS — Z8673 Personal history of transient ischemic attack (TIA), and cerebral infarction without residual deficits: Secondary | ICD-10-CM | POA: Insufficient documentation

## 2013-09-08 DIAGNOSIS — I6789 Other cerebrovascular disease: Secondary | ICD-10-CM

## 2013-09-08 DIAGNOSIS — E119 Type 2 diabetes mellitus without complications: Secondary | ICD-10-CM | POA: Insufficient documentation

## 2013-09-08 DIAGNOSIS — Z951 Presence of aortocoronary bypass graft: Secondary | ICD-10-CM | POA: Insufficient documentation

## 2013-09-08 MED ORDER — CLOPIDOGREL BISULFATE 75 MG PO TABS: 75.0000 mg | ORAL_TABLET | Freq: Every day | ORAL | Status: AC

## 2013-09-08 NOTE — Patient Instructions (Signed)
Start Plavix. We have made referral for carotid duplex.   Go to ER if you develop worsening dizziness, if you develop numbness or slurred speech. Follow up in 1 month.

## 2013-09-08 NOTE — Progress Notes (Signed)
Pre visit review using our clinic review tool, if applicable. No additional management support is needed unless otherwise documented below in the visit note. 

## 2013-09-08 NOTE — Progress Notes (Signed)
Subjective:    Patient ID: Briana Ray, female    DOB: October 15, 1923, 79 y.o.   MRN: [id number]  HPI  Ms. Briana Ray is an 79 yr old female who presents today for follow up of her dizziness. Reports dizziness continues and has issues with balance.  She underwent MRI of the brain on 12/1 which noted tiny acute infarct in the right parietal lobe.  Also noted was poor delineation of the right vertebral artery.  ? Occlusion of artery.  She is in the process of trying to transition to live in [place] with her daughter so that she will have more assistance. They are scheduled for a flight on 12/15 and wish to defer her trip due to symptoms and stroke.     Review of Systems See HPI  Past Medical History  Diagnosis Date  . Coronary heart disease   . CVA (cerebral infarction) 04/25/10    MRI L parietal lobe infarction   . Hyperlipidemia   . Diabetes mellitus, type 2   . GERD (gastroesophageal reflux disease)   . 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 Rheumatoid arthritis(714.0)     on Arava. Dx for 20 yrs  . Aneurysm     "on my heart; dr said I didn't have to worry about it, it's not big enough yet"  . Hypertension   . Myocardial infarction 1998; 2005  . Pneumonia 11/16/11    "first time ever"  . Anemia   . Blood transfusion   . Stroke   . Diverticulitis 2008  . Debility 07/23/2013    History   Social History  . Marital Status: Widowed    Spouse Name: N/A    Number of Children: N/A  . Years of Education: N/A   Occupational History  . Retired     [employer]   Social History Main Topics  . Smoking status: Never Smoker   . Smokeless tobacco: Never Used  . Alcohol Use: No     Comment: rare  . Drug Use: No  . Sexual Activity: Not Currently   Other Topics Concern  . Not on file   Social History Narrative  . No narrative on file    Past Surgical History  Procedure Laterality Date  . Breast reduction surgery    .  Cholecystectomy    . Appendectomy    . Tonsillectomy    . Vesicovaginal fistula closure w/ tah    . Carpal tunnel release      left  . Coronary angioplasty with stent placement  ~ 2002    "1"  . Hernia repair      umbilical  . Abdominal hysterectomy    . Cataract extraction w/ intraocular lens  implant, bilateral    . Coronary artery bypass graft  1998    vCABG X4 w/alve repair  . Breast surgery      reduction    Family History  Problem Relation Age of Onset  . Arthritis Mother   . Other Brother     lung issues- oxygen  . Pneumonia Brother   . Diabetes Sister   . Hyperlipidemia Sister   . Hypertension Sister   . Stroke Sister   . Kidney disease Father   . Fibromyalgia Daughter   . Cancer Son     colon  . Cancer Sister     colon?  . Dementia Sister   . Alcohol abuse Brother   . Aneurysm  Brother   . Heart disease Brother     Allergies  Allergen Reactions  . Amoxicillin-Pot Clavulanate     REACTION: diarrhea    Current Outpatient Prescriptions on File Prior to Visit  Medication Sig Dispense Refill  . aspirin EC 81 MG tablet Take 1 tablet (81 mg total) by mouth daily.  90 tablet  3  . atorvastatin (LIPITOR) 20 MG tablet Take 1 tablet (20 mg total) by mouth daily.  30 tablet  2  . glipiZIDE (GLUCOTROL XL) 2.5 MG 24 hr tablet Take 1 tablet (2.5 mg total) by mouth daily.  30 tablet  5  . hydroxychloroquine (PLAQUENIL) 200 MG tablet Take 300 mg by mouth daily.      Marland Kitchen leflunomide (ARAVA) 20 MG tablet Take 10 mg by mouth daily.       Marland Kitchen levothyroxine (SYNTHROID, LEVOTHROID) 50 MCG tablet Take 1 tablet (50 mcg total) by mouth daily before breakfast.  30 tablet  4  . metoprolol tartrate (LOPRESSOR) 25 MG tablet Take 0.5 tablets (12.5 mg total) by mouth 2 (two) times daily.  180 tablet  3  . Multiple Vitamins-Minerals (CENTRUM SILVER PO) Take 1 tablet by mouth daily.       . nitroGLYCERIN (NITROSTAT) 0.4 MG SL tablet Place 1 tablet (0.4 mg total) under the tongue every 5  (five) minutes as needed. For chest pain  25 tablet  6  . Omega-3 Fatty Acids (FISH OIL) 1200 MG CAPS Take 1 capsule by mouth daily.       . OXYGEN-HELIUM IN Inhale 2.5 L/hr into the lungs at bedtime.       No current facility-administered medications on file prior to visit.    BP 148/80  Pulse 75  Temp(Src) 98 F (36.7 C) (Oral)  Resp 16  Ht 4\' 11"  (1.499 m)  Wt 122 lb (55.339 kg)  BMI 24.63 kg/m2  SpO2 97%       Objective:   Physical Exam  Constitutional: She is oriented to person, place, and time. She appears well-developed and well-nourished. No distress.  Cardiovascular: Normal rate and regular rhythm.   No murmur heard. Pulmonary/Chest: Effort normal and breath sounds normal. No respiratory distress. She has no wheezes. She has no rales. She exhibits no tenderness.  Neurological: She is alert and oriented to person, place, and time.  Speech is clear.  + facial symmetry.  Bilateral UE/LE strength is 5/5.    Psychiatric: She has a normal mood and affect. Her behavior is normal. Judgment and thought content normal.          Assessment & Plan:

## 2013-09-08 NOTE — Telephone Encounter (Signed)
New problem   [name] is a doctor and want Dr Jens Som to call him back to discuss his mom's test today.

## 2013-09-08 NOTE — Telephone Encounter (Signed)
Will forward for dr crenshaw review  

## 2013-09-11 ENCOUNTER — Emergency Department (HOSPITAL_BASED_OUTPATIENT_CLINIC_OR_DEPARTMENT_OTHER): Payer: Medicare Other

## 2013-09-11 ENCOUNTER — Ambulatory Visit: Payer: Medicare Other | Admitting: Physical Therapy

## 2013-09-11 ENCOUNTER — Inpatient Hospital Stay (HOSPITAL_BASED_OUTPATIENT_CLINIC_OR_DEPARTMENT_OTHER)
Admission: EM | Admit: 2013-09-11 | Discharge: 2013-09-14 | DRG: 189 | Disposition: A | Discharge status: Home / Self Care | Payer: Medicare Other | Attending: Internal Medicine | Admitting: Internal Medicine

## 2013-09-11 ENCOUNTER — Encounter (HOSPITAL_BASED_OUTPATIENT_CLINIC_OR_DEPARTMENT_OTHER): Payer: Self-pay | Admitting: Emergency Medicine

## 2013-09-11 ENCOUNTER — Other Ambulatory Visit: Payer: Self-pay

## 2013-09-11 DIAGNOSIS — J8410 Pulmonary fibrosis, unspecified: Secondary | ICD-10-CM | POA: Diagnosis present

## 2013-09-11 DIAGNOSIS — Z8673 Personal history of transient ischemic attack (TIA), and cerebral infarction without residual deficits: Secondary | ICD-10-CM

## 2013-09-11 DIAGNOSIS — I10 Essential (primary) hypertension: Secondary | ICD-10-CM | POA: Diagnosis present

## 2013-09-11 DIAGNOSIS — I2510 Atherosclerotic heart disease of native coronary artery without angina pectoris: Secondary | ICD-10-CM | POA: Diagnosis present

## 2013-09-11 DIAGNOSIS — J441 Chronic obstructive pulmonary disease with (acute) exacerbation: Secondary | ICD-10-CM

## 2013-09-11 DIAGNOSIS — E119 Type 2 diabetes mellitus without complications: Secondary | ICD-10-CM | POA: Diagnosis present

## 2013-09-11 DIAGNOSIS — J189 Pneumonia, unspecified organism: Secondary | ICD-10-CM

## 2013-09-11 DIAGNOSIS — E785 Hyperlipidemia, unspecified: Secondary | ICD-10-CM | POA: Diagnosis present

## 2013-09-11 DIAGNOSIS — Z8249 Family history of ischemic heart disease and other diseases of the circulatory system: Secondary | ICD-10-CM

## 2013-09-11 DIAGNOSIS — Z833 Family history of diabetes mellitus: Secondary | ICD-10-CM

## 2013-09-11 DIAGNOSIS — K219 Gastro-esophageal reflux disease without esophagitis: Secondary | ICD-10-CM | POA: Diagnosis present

## 2013-09-11 DIAGNOSIS — E039 Hypothyroidism, unspecified: Secondary | ICD-10-CM | POA: Diagnosis present

## 2013-09-11 DIAGNOSIS — F0390 Unspecified dementia without behavioral disturbance: Secondary | ICD-10-CM | POA: Diagnosis present

## 2013-09-11 DIAGNOSIS — Z823 Family history of stroke: Secondary | ICD-10-CM

## 2013-09-11 DIAGNOSIS — I5043 Acute on chronic combined systolic (congestive) and diastolic (congestive) heart failure: Secondary | ICD-10-CM | POA: Diagnosis present

## 2013-09-11 DIAGNOSIS — I714 Abdominal aortic aneurysm, without rupture, unspecified: Secondary | ICD-10-CM | POA: Diagnosis present

## 2013-09-11 DIAGNOSIS — J9620 Acute and chronic respiratory failure, unspecified whether with hypoxia or hypercapnia: Principal | ICD-10-CM | POA: Diagnosis present

## 2013-09-11 DIAGNOSIS — Z9861 Coronary angioplasty status: Secondary | ICD-10-CM

## 2013-09-11 DIAGNOSIS — J440 Chronic obstructive pulmonary disease with acute lower respiratory infection: Secondary | ICD-10-CM | POA: Diagnosis present

## 2013-09-11 DIAGNOSIS — I252 Old myocardial infarction: Secondary | ICD-10-CM

## 2013-09-11 DIAGNOSIS — R5381 Other malaise: Secondary | ICD-10-CM | POA: Diagnosis present

## 2013-09-11 DIAGNOSIS — J209 Acute bronchitis, unspecified: Secondary | ICD-10-CM | POA: Diagnosis present

## 2013-09-11 DIAGNOSIS — I679 Cerebrovascular disease, unspecified: Secondary | ICD-10-CM | POA: Diagnosis present

## 2013-09-11 DIAGNOSIS — M069 Rheumatoid arthritis, unspecified: Secondary | ICD-10-CM | POA: Diagnosis present

## 2013-09-11 DIAGNOSIS — I509 Heart failure, unspecified: Secondary | ICD-10-CM | POA: Diagnosis present

## 2013-09-11 DIAGNOSIS — I5032 Chronic diastolic (congestive) heart failure: Secondary | ICD-10-CM

## 2013-09-11 DIAGNOSIS — Z951 Presence of aortocoronary bypass graft: Secondary | ICD-10-CM

## 2013-09-11 DIAGNOSIS — Z9981 Dependence on supplemental oxygen: Secondary | ICD-10-CM

## 2013-09-11 LAB — COMPREHENSIVE METABOLIC PANEL
AST: 38 U/L — ABNORMAL HIGH (ref 0–37)
Albumin: 3.3 g/dL — ABNORMAL LOW (ref 3.5–5.2)
Alkaline Phosphatase: 86 U/L (ref 39–117)
BUN: 15 mg/dL (ref 6–23)
CO2: 21 mEq/L (ref 19–32)
Calcium: 9.2 mg/dL (ref 8.4–10.5)
Chloride: 101 mEq/L (ref 96–112)
GFR calc Af Amer: 57 mL/min — ABNORMAL LOW (ref >90)
GFR calc non Af Amer: 49 mL/min — ABNORMAL LOW (ref >90)
Glucose, Bld: 98 mg/dL (ref 70–99)
Potassium: 3.7 mEq/L (ref 3.5–5.1)
Total Bilirubin: 0.5 mg/dL (ref 0.3–1.2)

## 2013-09-11 LAB — TROPONIN I
Troponin I: 0.6 ng/mL (ref <0.30)
Troponin I: 0.46 ng/mL (ref <0.30)

## 2013-09-11 LAB — CBC WITH DIFFERENTIAL/PLATELET
Basophils Absolute: 0 10*3/uL (ref 0.0–0.1)
Eosinophils Relative: 0 % (ref 0–5)
HCT: 37.3 % (ref 36.0–46.0)
Hemoglobin: 12.1 g/dL (ref 12.0–15.0)
Lymphocytes Relative: 11 % — ABNORMAL LOW (ref 12–46)
Lymphs Abs: 0.8 10*3/uL (ref 0.7–4.0)
MCH: 30.3 pg (ref 26.0–34.0)
MCV: 93.3 fL (ref 78.0–100.0)
Monocytes Absolute: 1 10*3/uL (ref 0.1–1.0)
Monocytes Relative: 14 % — ABNORMAL HIGH (ref 3–12)
Neutro Abs: 5.6 10*3/uL (ref 1.7–7.7)
Neutrophils Relative %: 75 % (ref 43–77)
RBC: 4 MIL/uL (ref 3.87–5.11)

## 2013-09-11 LAB — URINALYSIS, ROUTINE W REFLEX MICROSCOPIC
Bilirubin Urine: NEGATIVE
Glucose, UA: NEGATIVE mg/dL
Leukocytes, UA: NEGATIVE
Nitrite: NEGATIVE
Specific Gravity, Urine: 1.012 (ref 1.005–1.030)
Urobilinogen, UA: 0.2 mg/dL (ref 0.0–1.0)
pH: 5.5 (ref 5.0–8.0)

## 2013-09-11 LAB — PRO B NATRIURETIC PEPTIDE: Pro B Natriuretic peptide (BNP): 4266 pg/mL — ABNORMAL HIGH (ref 0–450)

## 2013-09-11 MED ORDER — ACETAMINOPHEN 325 MG PO TABS
ORAL_TABLET | ORAL | Status: AC
  Filled 2013-09-11: qty 2

## 2013-09-11 MED ORDER — GUAIFENESIN-DM 100-10 MG/5ML PO SYRP: 5.0000 mL | ORAL_SOLUTION | ORAL | Status: DC | PRN

## 2013-09-11 MED ORDER — CLOPIDOGREL BISULFATE 75 MG PO TABS
75.0000 mg | ORAL_TABLET | Freq: Every day | ORAL | Status: DC
  Administered 2013-09-12 – 2013-09-14 (×3): 75 mg via ORAL
  Filled 2013-09-11 (×4): qty 1

## 2013-09-11 MED ORDER — LOPERAMIDE HCL 2 MG PO TABS
2.0000 mg | ORAL_TABLET | ORAL | Status: DC | PRN
Start: 2013-09-11 — End: 2013-09-14
  Administered 2013-09-13: 2 mg via ORAL
  Filled 2013-09-11 (×2): qty 1

## 2013-09-11 MED ORDER — HYDROXYCHLOROQUINE SULFATE 200 MG PO TABS
300.0000 mg | ORAL_TABLET | Freq: Every day | ORAL | Status: DC
  Administered 2013-09-12 – 2013-09-14 (×3): 300 mg via ORAL
  Filled 2013-09-11 (×3): qty 1.5

## 2013-09-11 MED ORDER — ATORVASTATIN CALCIUM 20 MG PO TABS
20.0000 mg | ORAL_TABLET | Freq: Every day | ORAL | Status: DC
  Administered 2013-09-11 – 2013-09-14 (×4): 20 mg via ORAL
  Filled 2013-09-11 (×4): qty 1

## 2013-09-11 MED ORDER — ONDANSETRON HCL 4 MG/2ML IJ SOLN: 4.0000 mg | Freq: Four times a day (QID) | INTRAMUSCULAR | Status: DC | PRN

## 2013-09-11 MED ORDER — CEFTRIAXONE SODIUM 1 G IJ SOLR
INTRAMUSCULAR | Status: AC
  Filled 2013-09-11: qty 10

## 2013-09-11 MED ORDER — FUROSEMIDE 10 MG/ML IJ SOLN
40.0000 mg | Freq: Every day | INTRAMUSCULAR | Status: DC
  Filled 2013-09-11: qty 4

## 2013-09-11 MED ORDER — ONDANSETRON HCL 4 MG PO TABS: 4.0000 mg | ORAL_TABLET | Freq: Four times a day (QID) | ORAL | Status: DC | PRN

## 2013-09-11 MED ORDER — DEXTROSE 5 % IV SOLN
1.0000 g | INTRAVENOUS | Status: DC
  Administered 2013-09-12 – 2013-09-14 (×3): 1 g via INTRAVENOUS
  Filled 2013-09-11 (×3): qty 10

## 2013-09-11 MED ORDER — HYDROCODONE-ACETAMINOPHEN 5-325 MG PO TABS: 1.0000 | ORAL_TABLET | ORAL | Status: DC | PRN

## 2013-09-11 MED ORDER — AZITHROMYCIN 500 MG PO TABS
500.0000 mg | ORAL_TABLET | Freq: Every day | ORAL | Status: DC
  Administered 2013-09-12 – 2013-09-14 (×3): 500 mg via ORAL
  Filled 2013-09-11 (×3): qty 1

## 2013-09-11 MED ORDER — DEXTROSE 5 % IV SOLN
500.0000 mg | Freq: Once | INTRAVENOUS | Status: AC
  Administered 2013-09-11: 500 mg via INTRAVENOUS

## 2013-09-11 MED ORDER — FUROSEMIDE 10 MG/ML IJ SOLN
40.0000 mg | Freq: Once | INTRAMUSCULAR | Status: AC
  Administered 2013-09-11: 40 mg via INTRAVENOUS
  Filled 2013-09-11: qty 4

## 2013-09-11 MED ORDER — ACETAMINOPHEN 325 MG PO TABS: 650.0000 mg | ORAL_TABLET | Freq: Four times a day (QID) | ORAL | Status: DC | PRN

## 2013-09-11 MED ORDER — METHYLPREDNISOLONE SODIUM SUCC 125 MG IJ SOLR
125.0000 mg | Freq: Once | INTRAMUSCULAR | Status: AC
  Administered 2013-09-11: 125 mg via INTRAVENOUS
  Filled 2013-09-11: qty 2

## 2013-09-11 MED ORDER — LEVOTHYROXINE SODIUM 50 MCG PO TABS
50.0000 ug | ORAL_TABLET | Freq: Every day | ORAL | Status: DC
  Administered 2013-09-12 – 2013-09-14 (×3): 50 ug via ORAL
  Filled 2013-09-11 (×4): qty 1

## 2013-09-11 MED ORDER — ACETAMINOPHEN 650 MG RE SUPP: 650.0000 mg | Freq: Four times a day (QID) | RECTAL | Status: DC | PRN

## 2013-09-11 MED ORDER — DEXTROSE 5 % IV SOLN
1.0000 g | Freq: Once | INTRAVENOUS | Status: AC
  Administered 2013-09-11: 1 g via INTRAVENOUS

## 2013-09-11 MED ORDER — NITROGLYCERIN 0.4 MG SL SUBL: 0.4000 mg | SUBLINGUAL_TABLET | SUBLINGUAL | Status: DC | PRN

## 2013-09-11 MED ORDER — METOPROLOL TARTRATE 12.5 MG HALF TABLET
12.5000 mg | ORAL_TABLET | Freq: Two times a day (BID) | ORAL | Status: DC
  Administered 2013-09-11 – 2013-09-14 (×6): 12.5 mg via ORAL
  Filled 2013-09-11 (×7): qty 1

## 2013-09-11 MED ORDER — GLIPIZIDE ER 2.5 MG PO TB24
2.5000 mg | ORAL_TABLET | Freq: Every day | ORAL | Status: DC
  Administered 2013-09-12 – 2013-09-14 (×3): 2.5 mg via ORAL
  Filled 2013-09-11 (×4): qty 1

## 2013-09-11 MED ORDER — BUDESONIDE-FORMOTEROL FUMARATE 160-4.5 MCG/ACT IN AERO
2.0000 | INHALATION_SPRAY | Freq: Two times a day (BID) | RESPIRATORY_TRACT | Status: DC
  Administered 2013-09-11 – 2013-09-13 (×2): 2 via RESPIRATORY_TRACT
  Filled 2013-09-11 (×2): qty 6

## 2013-09-11 MED ORDER — ASPIRIN EC 81 MG PO TBEC
81.0000 mg | DELAYED_RELEASE_TABLET | Freq: Every day | ORAL | Status: DC
  Administered 2013-09-12 – 2013-09-14 (×3): 81 mg via ORAL
  Filled 2013-09-11 (×3): qty 1

## 2013-09-11 MED ORDER — ENOXAPARIN SODIUM 40 MG/0.4ML ~~LOC~~ SOLN
40.0000 mg | SUBCUTANEOUS | Status: DC
  Administered 2013-09-11 – 2013-09-13 (×3): 40 mg via SUBCUTANEOUS
  Filled 2013-09-11 (×4): qty 0.4

## 2013-09-11 MED ORDER — ALBUTEROL SULFATE (5 MG/ML) 0.5% IN NEBU
INHALATION_SOLUTION | RESPIRATORY_TRACT | Status: AC
  Administered 2013-09-11: 5 mg
  Filled 2013-09-11: qty 1

## 2013-09-11 MED ORDER — ALUM & MAG HYDROXIDE-SIMETH 200-200-20 MG/5ML PO SUSP: 30.0000 mL | Freq: Four times a day (QID) | ORAL | Status: DC | PRN

## 2013-09-11 MED ORDER — ACETAMINOPHEN 325 MG PO TABS
650.0000 mg | ORAL_TABLET | Freq: Once | ORAL | Status: AC
  Administered 2013-09-11: 650 mg via ORAL

## 2013-09-11 MED ORDER — DEXTROSE 5 % IV SOLN
INTRAVENOUS | Status: AC
  Filled 2013-09-11: qty 500

## 2013-09-11 MED ORDER — ALBUTEROL SULFATE (5 MG/ML) 0.5% IN NEBU
2.5000 mg | INHALATION_SOLUTION | RESPIRATORY_TRACT | Status: DC | PRN
  Filled 2013-09-11: qty 0.5

## 2013-09-11 MED ORDER — IPRATROPIUM BROMIDE 0.02 % IN SOLN
RESPIRATORY_TRACT | Status: AC
  Administered 2013-09-11: 0.5 mg
  Filled 2013-09-11: qty 2.5

## 2013-09-11 NOTE — H&P (Signed)
[facility] History and Physical  Briana Ray ZOX:[id number] DOB: 23-Mar-1924 DOA: 09/11/2013  Referring physician: EDP PCP: Danise Edge, MD   Chief Complaint: dizzy, short of breath  HPI: Briana Ray is a 79 y.o. female with h/o RA and pulmonary fibrosis, CAD on intermittent oxygen presents to [facility] with dizziness and dyspnea.  Also cough for about a week. No chest pain.  In ED, temp 101.2.  WBC normal.  CXR shows pulmonary edema, v. Pulmonary fibrosis v. Atypical infection.  No myalgias, sore throat, rhinorhea, N/V/D.  Also, proBNP >4K and troponin 0.6.  No chest pain or ischemic changes on EKG.  Recived azithromycin, ceftriaxone, solumedrol, lasix and bronchodilator in ED.  Feels better.  No h/o CHF.  Review of Systems: systems reviewed. As above, otherwise negative.  Past Medical History  Diagnosis Date  . Coronary heart disease   . CVA (cerebral infarction) 04/25/10    MRI L parietal lobe infarction   . Hyperlipidemia   . Diabetes mellitus, type 2   . GERD (gastroesophageal reflux disease)   . 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 Rheumatoid arthritis(714.0)     on Arava. Dx for 20 yrs  . Aneurysm     "on my heart; dr said I didn't have to worry about it, it's not big enough yet"  . Hypertension   . Myocardial infarction 1998; 2005  . Pneumonia 11/16/11    "first time ever"  . Anemia   . Blood transfusion   . Stroke   . Diverticulitis 2008  . Debility 07/23/2013   Past Surgical History  Procedure Laterality Date  . Breast reduction surgery    . Cholecystectomy    . Appendectomy    . Tonsillectomy    . Vesicovaginal fistula closure w/ tah    . Carpal tunnel release      left  . Coronary angioplasty with stent placement  ~ 2002    "1"  . Hernia repair      umbilical  . Abdominal hysterectomy    . Cataract extraction w/ intraocular lens  implant, bilateral    . Coronary artery bypass graft  1998    vCABG  X4 w/alve repair  . Breast surgery      reduction   Social History:  reports that she has never smoked. She has never used smokeless tobacco. She reports that she does not drink alcohol or use illicit drugs.  Allergies  Allergen Reactions  . Amoxicillin-Pot Clavulanate     REACTION: diarrhea    Family History  Problem Relation Age of Onset  . Arthritis Mother   . Other Brother     lung issues- oxygen  . Pneumonia Brother   . Diabetes Sister   . Hyperlipidemia Sister   . Hypertension Sister   . Stroke Sister   . Kidney disease Father   . Fibromyalgia Daughter   . Cancer Son     colon  . Cancer Sister     colon?  . Dementia Sister   . Alcohol abuse Brother   . Aneurysm Brother   . Heart disease Brother      Prior to Admission medications   Medication Sig Start Date End Date Taking? Authorizing Provider  aspirin EC 81 MG tablet Take 1 tablet (81 mg total) by mouth daily. 08/31/13   Lewayne Bunting, MD  atorvastatin (LIPITOR) 20 MG tablet Take 1 tablet (20 mg total) by mouth daily. 06/30/13  Bradd Canary, MD  clopidogrel (PLAVIX) 75 MG tablet Take 1 tablet (75 mg total) by mouth daily with breakfast. 09/08/13   Sandford Craze, NP  glipiZIDE (GLUCOTROL XL) 2.5 MG 24 hr tablet Take 1 tablet (2.5 mg total) by mouth daily. 07/28/13   Bradd Canary, MD  hydroxychloroquine (PLAQUENIL) 200 MG tablet Take 300 mg by mouth daily.    Historical Provider, MD  leflunomide (ARAVA) 20 MG tablet Take 10 mg by mouth daily.     Historical Provider, MD  levothyroxine (SYNTHROID, LEVOTHROID) 50 MCG tablet Take 1 tablet (50 mcg total) by mouth daily before breakfast. 07/21/13   Bradd Canary, MD  loperamide (IMODIUM A-D) 2 MG tablet Take 2 mg by mouth as needed. For diaherra    Historical Provider, MD  metoprolol tartrate (LOPRESSOR) 25 MG tablet Take 0.5 tablets (12.5 mg total) by mouth 2 (two) times daily. 07/09/13   Piedad Climes, PA-C  Multiple Vitamins-Minerals (CENTRUM SILVER  PO) Take 1 tablet by mouth daily.     Historical Provider, MD  nitroGLYCERIN (NITROSTAT) 0.4 MG SL tablet Place 1 tablet (0.4 mg total) under the tongue every 5 (five) minutes as needed. For chest pain 08/18/12   Peter M [place], MD  Omega-3 Fatty Acids (FISH OIL) 1200 MG CAPS Take 1 capsule by mouth daily.     Historical Provider, MD  OXYGEN-HELIUM IN Inhale 2.5 L/hr into the lungs at bedtime.    Historical Provider, MD  SYMBICORT 160-4.5 MCG/ACT inhaler INHALE 2 PUFFS BY MOUTH TWICE DAILY. RINSE MOUTH AFTER EACH USE 07/08/13   Storm Frisk, MD   Physical Exam: Filed Vitals:   09/11/13 1808  BP: 127/77  Pulse: 94  Temp: 97.6 F (36.4 C)  Resp: 17    BP 127/77  Pulse 94  Temp(Src) 97.6 F (36.4 C) (Oral)  Resp 17  Ht 5' (1.524 m)  Wt 54.4 kg (119 lb 14.9 oz)  BMI 23.42 kg/m2  SpO2 95%  BP 127/77  Pulse 94  Temp(Src) 97.6 F (36.4 C) (Oral)  Resp 17  Ht 5' (1.524 m)  Wt 54.4 kg (119 lb 14.9 oz)  BMI 23.42 kg/m2  SpO2 95%  General Appearance:    Alert, cooperative, no distress, appears stated age  Head:    Normocephalic, without obvious abnormality, atraumatic  Eyes:    PERRL, conjunctiva/corneas clear, EOM's intact, fundi    benign, both eyes     Nose:   Nares normal, septum midline, mucosa normal, no drainage    or sinus tenderness  Throat:   Lips, mucosa, and tongue normal; teeth and gums normal  Neck:   Supple, symmetrical, trachea midline, no adenopathy;    thyroid:  no enlargement/tenderness/nodules; no carotid   bruit or JVD  Back:     Symmetric, no curvature, ROM normal, no CVA tenderness  Lungs:     velcro rales. No wheeze. Good air movement. No rhonchi  Chest Wall:    No tenderness or deformity   Heart:    Regular rate and rhythm, S1 and S2 normal, no murmur, rub   or gallop  Breast Exam:    No tenderness, masses, or nipple abnormality  Abdomen:     Soft, non-tender, bowel sounds active all four quadrants,    no masses, no organomegaly  Genitalia:   deferred  Rectal:   deferred  Extremities:   Extremities normal, atraumatic, no cyanosis or edema  Pulses:   2+ and symmetric all extremities  Skin:  Skin color, texture, turgor normal, no rashes or lesions  Lymph nodes:   Cervical, supraclavicular, and axillary nodes normal  Neurologic:   CNII-XII intact, normal strength, sensation and reflexes    throughout              Psych: normal affect.  Labs on Admission:  Basic Metabolic Panel:  Recent Labs Lab 09/11/13 1430  NA 137  K 3.7  CL 101  CO2 21  GLUCOSE 98  BUN 15  CREATININE 1.00  CALCIUM 9.2   Liver Function Tests:  Recent Labs Lab 09/11/13 1430  AST 38*  ALT 18  ALKPHOS 86  BILITOT 0.5  PROT 7.1  ALBUMIN 3.3*   No results found for this basename: LIPASE, AMYLASE,  in the last 168 hours No results found for this basename: AMMONIA,  in the last 168 hours CBC:  Recent Labs Lab 09/11/13 1430  WBC 7.5  NEUTROABS 5.6  HGB 12.1  HCT 37.3  MCV 93.3  PLT 200   Cardiac Enzymes:  Recent Labs Lab 09/11/13 1430  TROPONINI 0.60*    BNP (last 3 results)  Recent Labs  09/11/13 1430  PROBNP 4266.0*   CBG: No results found for this basename: GLUCAP,  in the last 168 hours  Radiological Exams on Admission: Dg Chest 2 View  09/11/2013   CLINICAL DATA:  Fever, shortness of breath  EXAM: CHEST  2 VIEW  COMPARISON:  12/31/2011; 11/16/2011; 08/05/2011  FINDINGS: Grossly unchanged borderline enlarged cardiac silhouette and mediastinal contours post median sternotomy, CABG and valve replacement. There is persistent tortuosity and possible slight ectasia of the thoracic aorta with associated atherosclerotic calcifications.  Lung volumes are slightly reduced. Pulmonary vasculature is less distinct of the present examination with cephalization of flow. Worsening bibasilar slightly coarsened interstitial opacities. No definite pleural effusion or pneumothorax. Grossly unchanged bones.  IMPRESSION: 1. Findings most  suggestive of pulmonary edema superimposed on emphysematous change, though note, underlying atypical infection and/or pulmonary fibrosis is not excluded on the basis of this examination. 2. Worsening bibasilar atelectasis. No focal airspace opacities to suggest pneumonia.   Electronically Signed   By: Simonne Come M.D.   On: 09/11/2013 14:25    EKG: Sinus tachycardia with occasional Premature ventricular complexes and Fusion complexes Incomplete right bundle branch block Left anterior fascicular block Anterolateral infarct , age undetermined T wave abnormality, consider inferior ischemia  Assessment/Plan Probable pneumonia: continue rocephin and azithromycin.  No longer wheezing.  No steroids for now.   Probable CHF: get echo and continue lasix.  Echo in 2011 showed normal EF Abnormal troponin: no CP. Trend. Continue ASA, metoprolol.   HYPERLIPIDEMIA   HYPERTENSION   CORONARY HEART DISEASE   Rheumatoid arthritis Hypothyroid: check TSH DM 2: continue glipizide and monitor CBG  Code Status: full Family Communication: *none. Pt is aler and oriented Disposition Plan: home  Time spent: 60  Briana Ray L [facility] Pager [phone]

## 2013-09-11 NOTE — ED Provider Notes (Signed)
4:40 PM patient is alert Glasgow Coma Score 15. Appears to be in no distress  Briana Sou, MD 09/11/13 1645

## 2013-09-11 NOTE — ED Notes (Signed)
Nasal cannula applied for oxygenation.

## 2013-09-11 NOTE — ED Notes (Signed)
Daughter states she has O2 at home but is noncompliant

## 2013-09-11 NOTE — Progress Notes (Signed)
CRITICAL VALUE ALERT  Critical value received: Troponin=0.46  Date of notification:  09/11/2013  Time of notification:  2200  Critical value read back:yes  Nurse who received alert:  R. Skylene Deremer rn  MD notified (1st page):  Maren Reamer  Time of first page:  2210  MD notified (2nd page):  Time of second page:  Responding MD:  Maren Reamer  Time MD responded:  2215

## 2013-09-11 NOTE — Telephone Encounter (Signed)
Follow up    Returning Briana Ray's call from friday

## 2013-09-11 NOTE — ED Notes (Signed)
Pt woke approx 7am with dizziness-denies any new pain site-hx of chronic pain and is at baseline-pt A/O

## 2013-09-11 NOTE — Telephone Encounter (Signed)
Spoke with pt son, questions regarding recent abdominal duplex results and future testing answered. He wants to know from dr Jens Som if the pt would be a surgical candidate if the future testing were to be abnormal. Will discuss with dr Jens Som and call him back. He agreed with this plan.

## 2013-09-11 NOTE — ED Notes (Signed)
MD at bedside discussing test results and dispo plan of care. 

## 2013-09-11 NOTE — ED Provider Notes (Addendum)
CSN: [id number]     Arrival date & time 09/11/13  1318 History   This chart was scribed for Geoffery Lyons, MD by Clydene Laming, ED Scribe. This patient was seen in room MH03/MH03 and the patient's care was started at 2:00 PM.   Chief Complaint  Patient presents with  . Dizziness    HPI HPI Comments: Briana Ray is a 79 y.o. female who presents to the Emergency Department complaining of dizziness with an associated fever onset this morning at 7AM. Pt has also had a constant, non productive cough in the last week. Pt uses oxygen at home. Her daughter states she is usually dizzy and sob but the fever is abnormal. Pt was seen by her pcp and was told to postpone her flight. Pt denies abd pain and vomiting. Pt has a hx of rheumatoid arthritis and heart surgery. Pt has had 3 strokes, the most recent 2 weeks ago. Pt was placed on plavix.      Past Medical History  Diagnosis Date  . Coronary heart disease   . CVA (cerebral infarction) 04/25/10    MRI L parietal lobe infarction   . Hyperlipidemia   . Diabetes mellitus, type 2   . GERD (gastroesophageal reflux disease)   . 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 Rheumatoid arthritis(714.0)     on Arava. Dx for 20 yrs  . Aneurysm     "on my heart; dr said I didn't have to worry about it, it's not big enough yet"  . Hypertension   . Myocardial infarction 1998; 2005  . Pneumonia 11/16/11    "first time ever"  . Anemia   . Blood transfusion   . Stroke   . Diverticulitis 2008  . Debility 07/23/2013   Past Surgical History  Procedure Laterality Date  . Breast reduction surgery    . Cholecystectomy    . Appendectomy    . Tonsillectomy    . Vesicovaginal fistula closure w/ tah    . Carpal tunnel release      left  . Coronary angioplasty with stent placement  ~ 2002    "1"  . Hernia repair      umbilical  . Abdominal hysterectomy    . Cataract extraction w/ intraocular lens  implant, bilateral     . Coronary artery bypass graft  1998    vCABG X4 w/alve repair  . Breast surgery      reduction   Family History  Problem Relation Age of Onset  . Arthritis Mother   . Other Brother     lung issues- oxygen  . Pneumonia Brother   . Diabetes Sister   . Hyperlipidemia Sister   . Hypertension Sister   . Stroke Sister   . Kidney disease Father   . Fibromyalgia Daughter   . Cancer Son     colon  . Cancer Sister     colon?  . Dementia Sister   . Alcohol abuse Brother   . Aneurysm Brother   . Heart disease Brother    History  Substance Use Topics  . Smoking status: Never Smoker   . Smokeless tobacco: Never Used  . Alcohol Use: No     Comment: rare   OB History   Grav Para Term Preterm Abortions TAB SAB Ect Mult Living                 Review of Systems  Allergies  Amoxicillin-pot clavulanate  Home Medications   Current Outpatient Rx  Name  Route  Sig  Dispense  Refill  . aspirin EC 81 MG tablet   Oral   Take 1 tablet (81 mg total) by mouth daily.   90 tablet   3   . atorvastatin (LIPITOR) 20 MG tablet   Oral   Take 1 tablet (20 mg total) by mouth daily.   30 tablet   2   . clopidogrel (PLAVIX) 75 MG tablet   Oral   Take 1 tablet (75 mg total) by mouth daily with breakfast.   30 tablet   5   . glipiZIDE (GLUCOTROL XL) 2.5 MG 24 hr tablet   Oral   Take 1 tablet (2.5 mg total) by mouth daily.   30 tablet   5   . hydroxychloroquine (PLAQUENIL) 200 MG tablet   Oral   Take 300 mg by mouth daily.         Marland Kitchen leflunomide (ARAVA) 20 MG tablet   Oral   Take 10 mg by mouth daily.          Marland Kitchen levothyroxine (SYNTHROID, LEVOTHROID) 50 MCG tablet   Oral   Take 1 tablet (50 mcg total) by mouth daily before breakfast.   30 tablet   4   . loperamide (IMODIUM A-D) 2 MG tablet   Oral   Take 2 mg by mouth as needed. For diaherra         . metoprolol tartrate (LOPRESSOR) 25 MG tablet   Oral   Take 0.5 tablets (12.5 mg total) by mouth 2 (two) times  daily.   180 tablet   3   . Multiple Vitamins-Minerals (CENTRUM SILVER PO)   Oral   Take 1 tablet by mouth daily.          . nitroGLYCERIN (NITROSTAT) 0.4 MG SL tablet   Sublingual   Place 1 tablet (0.4 mg total) under the tongue every 5 (five) minutes as needed. For chest pain   25 tablet   6   . Omega-3 Fatty Acids (FISH OIL) 1200 MG CAPS   Oral   Take 1 capsule by mouth daily.          . OXYGEN-HELIUM IN   Inhalation   Inhale 2.5 L/hr into the lungs at bedtime.         . SYMBICORT 160-4.5 MCG/ACT inhaler      INHALE 2 PUFFS BY MOUTH TWICE DAILY. RINSE MOUTH AFTER EACH USE   10.2 g   6    Triage Vitals:BP 149/90  Pulse 109  Temp(Src) 101.2 F (38.4 C) (Oral)  Resp 26  Ht 5' (1.524 m)  Wt 122 lb (55.339 kg)  BMI 23.83 kg/m2  SpO2 94% Physical Exam  Nursing note and vitals reviewed. Constitutional: She is oriented to person, place, and time. She appears well-developed and well-nourished. No distress.  HENT:  Head: Normocephalic and atraumatic.  Neck: Normal range of motion. Neck supple.  Cardiovascular: Normal rate and regular rhythm.  Exam reveals no gallop and no friction rub.   No murmur heard. Pulmonary/Chest: Effort normal. No respiratory distress. She has wheezes. She has rales.  There are bilateral rhonchi and rales present.  Abdominal: Soft. Bowel sounds are normal. She exhibits no distension. There is no tenderness.  Musculoskeletal: Normal range of motion. She exhibits edema.  There is 2 to 3+ bilateral lower extremity pitting edema present.  Neurological: She is alert and oriented to person, place, and time.  Skin: Skin is warm and dry. She is not diaphoretic.    ED Course  Procedures (including critical care time) DIAGNOSTIC STUDIES: Oxygen Saturation is 94% on RA, normal by my interpretation.    COORDINATION OF CARE: 2:12 PM- Discussed treatment plan with pt at bedside. Pt verbalized understanding and agreement with plan.   Labs  Review Labs Reviewed - No data to display Imaging Review No results found.   Date: 09/11/2013  Rate: 104  Rhythm: sinus tachycardia  QRS Axis: normal  Intervals: normal  ST/T Wave abnormalities: nonspecific T wave changes  Conduction Disutrbances:none  Narrative Interpretation:   Old EKG Reviewed: unchanged    MDM  No diagnosis found. Patient is an 79 year old female with a history of COPD and CAD status post bypass. She presents today with complaints of dizziness and shortness of breath that started this morning. She reports a cough earlier on in the week. Workup reveals evidence for CHF on the chest x-ray and elevated BNP. She is also febrile upon presentation which raises my concern for pneumonia. She was also found to have a troponin of 0.6. It appears as though her dyspnea and dizziness is multifactorial and significant enough to cause her troponin to elevate. She was given Lasix, antibiotics, and steroids along with a breathing treatment in the emergency Department. I've spoken with Dr. Yehuda Savannah from [facility] agrees to admit the patient to his service for further treatment and observation.  CRITICAL CARE Performed by: Geoffery Lyons Total critical care time: 30 minutes Critical care time was exclusive of separately billable procedures and treating other patients. Critical care was necessary to treat or prevent imminent or life-threatening deterioration. Critical care was time spent personally by me on the following activities: development of treatment plan with patient and/or surrogate as well as nursing, discussions with consultants, evaluation of patient's response to treatment, examination of patient, obtaining history from patient or surrogate, ordering and performing treatments and interventions, ordering and review of laboratory studies, ordering and review of radiographic studies, pulse oximetry and re-evaluation of patient's condition.     I personally performed the  services described in this documentation, which was scribed in my presence. The recorded information has been reviewed and is accurate.    Geoffery Lyons, MD 09/11/13 1547  Geoffery Lyons, MD 09/11/13 1549  Geoffery Lyons, MD 09/11/13 [phone]

## 2013-09-12 ENCOUNTER — Encounter: Payer: Self-pay | Admitting: Family Medicine

## 2013-09-12 DIAGNOSIS — J9620 Acute and chronic respiratory failure, unspecified whether with hypoxia or hypercapnia: Principal | ICD-10-CM

## 2013-09-12 DIAGNOSIS — J209 Acute bronchitis, unspecified: Secondary | ICD-10-CM | POA: Diagnosis present

## 2013-09-12 DIAGNOSIS — I5032 Chronic diastolic (congestive) heart failure: Secondary | ICD-10-CM

## 2013-09-12 LAB — LEGIONELLA ANTIGEN, URINE: Legionella Antigen, Urine: NEGATIVE

## 2013-09-12 LAB — GLUCOSE, CAPILLARY
Glucose-Capillary: 147 mg/dL — ABNORMAL HIGH (ref 70–99)
Glucose-Capillary: 182 mg/dL — ABNORMAL HIGH (ref 70–99)
Glucose-Capillary: 130 mg/dL — ABNORMAL HIGH (ref 70–99)
Glucose-Capillary: 178 mg/dL — ABNORMAL HIGH (ref 70–99)

## 2013-09-12 LAB — TROPONIN I: Troponin I: 0.34 ng/mL (ref <0.30)

## 2013-09-12 LAB — BASIC METABOLIC PANEL
BUN: 22 mg/dL (ref 6–23)
Calcium: 9.4 mg/dL (ref 8.4–10.5)
Creatinine, Ser: 1 mg/dL (ref 0.50–1.10)
GFR calc Af Amer: 57 mL/min — ABNORMAL LOW (ref >90)
GFR calc non Af Amer: 49 mL/min — ABNORMAL LOW (ref >90)
Glucose, Bld: 209 mg/dL — ABNORMAL HIGH (ref 70–99)

## 2013-09-12 LAB — TSH: TSH: 0.506 u[IU]/mL (ref 0.350–4.500)

## 2013-09-12 MED ORDER — INSULIN ASPART 100 UNIT/ML ~~LOC~~ SOLN: 0.0000 [IU] | Freq: Every day | SUBCUTANEOUS | Status: DC

## 2013-09-12 MED ORDER — INSULIN ASPART 100 UNIT/ML ~~LOC~~ SOLN
0.0000 [IU] | Freq: Three times a day (TID) | SUBCUTANEOUS | Status: DC
  Administered 2013-09-12: 17:00:00 2 [IU] via SUBCUTANEOUS
  Administered 2013-09-12 – 2013-09-13 (×2): 3 [IU] via SUBCUTANEOUS
  Administered 2013-09-13: 06:00:00 2 [IU] via SUBCUTANEOUS

## 2013-09-12 MED ORDER — INSULIN DETEMIR 100 UNIT/ML ~~LOC~~ SOLN
5.0000 [IU] | Freq: Every day | SUBCUTANEOUS | Status: DC
  Administered 2013-09-12 – 2013-09-13 (×2): 5 [IU] via SUBCUTANEOUS
  Filled 2013-09-12 (×3): qty 0.05

## 2013-09-12 MED ORDER — METHYLPREDNISOLONE SODIUM SUCC 125 MG IJ SOLR
80.0000 mg | Freq: Two times a day (BID) | INTRAMUSCULAR | Status: DC
  Administered 2013-09-12 (×2): 80 mg via INTRAVENOUS
  Filled 2013-09-12 (×4): qty 1.28

## 2013-09-12 MED ORDER — BUDESONIDE-FORMOTEROL FUMARATE 160-4.5 MCG/ACT IN AERO
2.0000 | INHALATION_SPRAY | Freq: Two times a day (BID) | RESPIRATORY_TRACT | Status: DC
  Administered 2013-09-12: 21:00:00 2 via RESPIRATORY_TRACT
  Filled 2013-09-12 (×2): qty 6

## 2013-09-12 MED ORDER — INSULIN ASPART 100 UNIT/ML ~~LOC~~ SOLN
3.0000 [IU] | Freq: Three times a day (TID) | SUBCUTANEOUS | Status: DC
  Administered 2013-09-12 – 2013-09-13 (×4): 3 [IU] via SUBCUTANEOUS
  Administered 2013-09-13: 12:00:00 via SUBCUTANEOUS
  Administered 2013-09-14 (×2): 3 [IU] via SUBCUTANEOUS

## 2013-09-12 NOTE — Progress Notes (Addendum)
[facility] PROGRESS NOTE Interim History: 79 y.o. female with h/o RA and pulmonary fibrosis, CAD on intermittent oxygen presents to [facility] with dizziness and dyspnea. Also cough for about a week. No chest pain. In ED, temp 101.2. WBC normal. CXR shows pulmonary edema, v. Pulmonary fibrosis v. Atypical infection. No myalgias, sore throat, rhinorhea, N/V/D. Also, proBNP >4K and troponin 0.6. No chest pain or ischemic changes on EKG.   Assessment/Plan: Acute-on-chronic respiratory failure/acute bronchitis: - Cont IV steroids and antibiotics. - she has remained afebrile, with a normal white blood cell count. - Is a difficult caseas she is a poor historian. She did have a temperature in the emergency room, she relates no fevers at home. She does relate a progressive cough that started about 2 weeks ago with increase of oxygen requirement that was noticed by his daughter. She doesn't have any JVD or hepatojugular reflex, she describes no orthopnea, no lower extreity the edema. 2-D echo is pending. It's highly unlikely that this could be an exacerbation of her diastolic heart failure. - I am leaning towards towards a lower respiratory tract infection like an acute bronchitis versus pneumonia. She does have pulmonary fibrosis would put her at higher risk of developing lower respiratory tract infection. I will continue IV steroids started initially and antibiotics. We'll continue her IV steroids and see her response. She does relate her shortness of breath is better. We'll get physical therapy to evaluate her. At baseline she relates she uses 2 L of oxygen at sleep. When ambulating check her saturations.  Chronic diastolic CHF (congestive heart failure), NYHA class 2:  - She seems to be compensated, I think the elevation of her BNP is probably a response to #1.  DIABETES MELLITUS, TYPE II: - continue CBGs a.c. And at bedtime. Right sliding-scale insulin as her heart started her on  steroids.  HYPERTENSION - oral control continue current treatment.  Rheumatoid arthritis(714.0) - Currently stable.    Code Status: full  Family Communication: none. Pt is alert and oriented  Disposition Plan: home    Consultants:  none  Procedures:  CXR  Antibiotics:  Rocephin and azithro  HPI/Subjective: Complain of SOB and productive cough  Objective: Filed Vitals:   09/11/13 1347 09/11/13 1530 09/11/13 1808 09/12/13 0348  BP:  133/79 127/77 104/70  Pulse:  103 94 73  Temp:  99 F (37.2 C) 97.6 F (36.4 C) 97.5 F (36.4 C)  TempSrc:  Oral Oral Oral  Resp: 26 32 17 19  Height:   5' (1.524 m)   Weight:   54.4 kg (119 lb 14.9 oz) 52.3 kg (115 lb 4.8 oz)  SpO2: 94% 97% 95% 99%    Intake/Output Summary (Last 24 hours) at 09/12/13 1014 Last data filed at 09/12/13 0844  Gross per 24 hour  Intake    540 ml  Output    900 ml  Net   -360 ml   Filed Weights   09/11/13 1334 09/11/13 1808 09/12/13 0348  Weight: 55.339 kg (122 lb) 54.4 kg (119 lb 14.9 oz) 52.3 kg (115 lb 4.8 oz)    Exam:  General: Alert, awake, oriented x3, in no acute distress.  HEENT: No bruits, no goiter. -JVD, -HJ reflex Heart: Regular rate and rhythm, without murmurs, rubs, gallops.  Lungs: moderate air movement with ronchi diffuse and crackles. Abdomen: Soft, nontender, nondistended, positive bowel sounds.     Data Reviewed: Basic Metabolic Panel:  Recent Labs Lab 09/11/13 1430 09/12/13 0038  NA 137 136  K 3.7 4.2  CL 101 98  CO2 21 23  GLUCOSE 98 209*  BUN 15 22  CREATININE 1.00 1.00  CALCIUM 9.2 9.4   Liver Function Tests:  Recent Labs Lab 09/11/13 1430  AST 38*  ALT 18  ALKPHOS 86  BILITOT 0.5  PROT 7.1  ALBUMIN 3.3*   No results found for this basename: LIPASE, AMYLASE,  in the last 168 hours No results found for this basename: AMMONIA,  in the last 168 hours CBC:  Recent Labs Lab 09/11/13 1430  WBC 7.5  NEUTROABS 5.6  HGB 12.1  HCT 37.3  MCV  93.3  PLT 200   Cardiac Enzymes:  Recent Labs Lab 09/11/13 1430 09/11/13 1940 09/12/13 0038  TROPONINI 0.60* 0.46* 0.34*   BNP (last 3 results)  Recent Labs  09/11/13 1430  PROBNP 4266.0*   CBG:  Recent Labs Lab 09/12/13 0617  GLUCAP 147*    No results found for this or any previous visit (from the past 240 hour(s)).   Studies: Dg Chest 2 View  09/11/2013   CLINICAL DATA:  Fever, shortness of breath  EXAM: CHEST  2 VIEW  COMPARISON:  12/31/2011; 11/16/2011; 08/05/2011  FINDINGS: Grossly unchanged borderline enlarged cardiac silhouette and mediastinal contours post median sternotomy, CABG and valve replacement. There is persistent tortuosity and possible slight ectasia of the thoracic aorta with associated atherosclerotic calcifications.  Lung volumes are slightly reduced. Pulmonary vasculature is less distinct of the present examination with cephalization of flow. Worsening bibasilar slightly coarsened interstitial opacities. No definite pleural effusion or pneumothorax. Grossly unchanged bones.  IMPRESSION: 1. Findings most suggestive of pulmonary edema superimposed on emphysematous change, though note, underlying atypical infection and/or pulmonary fibrosis is not excluded on the basis of this examination. 2. Worsening bibasilar atelectasis. No focal airspace opacities to suggest pneumonia.   Electronically Signed   By: Simonne Come M.D.   On: 09/11/2013 14:25    Scheduled Meds: . aspirin EC  81 mg Oral Daily  . atorvastatin  20 mg Oral Daily  . azithromycin  500 mg Oral Daily  . budesonide-formoterol  2 puff Inhalation BID  . budesonide-formoterol  2 puff Inhalation BID  . cefTRIAXone (ROCEPHIN)  IV  1 g Intravenous Q24H  . clopidogrel  75 mg Oral Q breakfast  . enoxaparin (LOVENOX) injection  40 mg Subcutaneous Q24H  . glipiZIDE  2.5 mg Oral Q breakfast  . hydroxychloroquine  300 mg Oral Daily  . insulin aspart  0-15 Units Subcutaneous TID WC  . insulin aspart  0-5  Units Subcutaneous QHS  . insulin aspart  3 Units Subcutaneous TID WC  . insulin detemir  5 Units Subcutaneous QHS  . levothyroxine  50 mcg Oral QAC breakfast  . methylPREDNISolone (SOLU-MEDROL) injection  80 mg Intravenous Q12H  . metoprolol tartrate  12.5 mg Oral BID   Continuous Infusions:    Marinda Elk  [facility] Pager [phone].  If 8PM-8AM, please contact night-coverage at [email or web address], password [facility] 09/12/2013, 10:14 AM  LOS: 1 day

## 2013-09-12 NOTE — Evaluation (Signed)
Physical Therapy Evaluation Patient Details Name: RUQAYYA VENTRESS MRN: [id number] DOB: 04-26-1924 Today's Date: 09/12/2013 Time: 4098-1191 PT Time Calculation (min): 28 min  PT Assessment / Plan / Recommendation History of Present Illness  79 y.o. female with h/o RA and pulmonary fibrosis, CAD on intermittent oxygen presents to [facility] with dizziness and dyspnea. Also cough for about a week. No chest pain. In ED, temp 101.2. WBC normal. CXR shows pulmonary edema, v. Pulmonary fibrosis v. Atypical infection. No myalgias, sore throat, rhinorhea, N/V/D. Also, proBNP >4K and troponin 0.6. No chest pain or ischemic changes on EKG.  Clinical Impression  Pt very pleasant and endorses 3 recent falls with and without cane. Recommend RW due to falls, decreased balance and desaturation on RA. Pt with sats 95% at rest on RA with drop to 87% with ambulation. 95% on 2L with gait. Pt with below deficits and states she has been receiving OPPT but unclear as to where. Pt would benefit from acute as well as continued OPPT to address balance and functional deficits to maximize independence.     PT Assessment  Patient needs continued PT services    Follow Up Recommendations  Outpatient PT    Does the patient have the potential to tolerate intense rehabilitation      Barriers to Discharge        Equipment Recommendations  Rolling walker with 5" wheels    Recommendations for Other Services     Frequency Min 3X/week    Precautions / Restrictions Precautions Precautions: Fall   Pertinent Vitals/Pain No pain      Mobility  Bed Mobility Bed Mobility: Not assessed Details for Bed Mobility Assistance: pt in recliner at beginning of session Transfers Transfers: Sit to Stand;Stand to Sit Sit to Stand: 5: Supervision;From chair/3-in-1 Stand to Sit: 5: Supervision;To chair/3-in-1;To toilet Details for Transfer Assistance: cueing for hand placement not to pull on RW Ambulation/Gait Ambulation/Gait  Assistance: 5: Supervision Ambulation Distance (Feet): 300 Feet Assistive device: Rolling walker Ambulation/Gait Assistance Details: cueing for stepping into Rw and posture Gait Pattern: Step-through pattern;Decreased stride length Gait velocity: decreased Stairs: No    Exercises     PT Diagnosis: Difficulty walking  PT Problem List: Decreased activity tolerance;Decreased balance;Decreased knowledge of use of DME PT Treatment Interventions: Gait training;DME instruction;Functional mobility training;Patient/family education     PT Goals(Current goals can be found in the care plan section) Acute Rehab PT Goals Patient Stated Goal: move to [facility] with her dgtr PT Goal Formulation: With patient Time For Goal Achievement: 09/19/13 Potential to Achieve Goals: Good  Visit Information  Last PT Received On: 09/12/13 Assistance Needed: +1 History of Present Illness: 79 y.o. female with h/o RA and pulmonary fibrosis, CAD on intermittent oxygen presents to [facility] with dizziness and dyspnea. Also cough for about a week. No chest pain. In ED, temp 101.2. WBC normal. CXR shows pulmonary edema, v. Pulmonary fibrosis v. Atypical infection. No myalgias, sore throat, rhinorhea, N/V/D. Also, proBNP >4K and troponin 0.6. No chest pain or ischemic changes on EKG.       Prior Functioning  Home Living Family/patient expects to be discharged to:: Private residence Living Arrangements: Children Available Help at Discharge: Family Type of Home: House Home Access: Level entry Home Layout: One level Home Equipment: Environmental consultant - standard;Cane - single point Prior Function Level of Independence: Independent Comments: pt reports she uses the cane most of the time and furniture walks inside the house but has had 3 recent falls with and without cane  Communication Communication: No difficulties    Cognition  Cognition Arousal/Alertness: Awake/alert Behavior During Therapy: WFL for tasks  assessed/performed Overall Cognitive Status: Within Functional Limits for tasks assessed    Extremity/Trunk Assessment Lower Extremity Assessment Lower Extremity Assessment: Generalized weakness Cervical / Trunk Assessment Cervical / Trunk Assessment: Normal   Balance    End of Session PT - End of Session Equipment Utilized During Treatment: Gait belt;Oxygen Activity Tolerance: Patient tolerated treatment well Patient left: with nursing/sitter in room;Other (comment) (in bathroom) Nurse Communication: Mobility status  GP     Toney Sang Beth 09/12/2013, 2:38 PM Delaney Meigs, PT [phone]

## 2013-09-12 NOTE — Progress Notes (Signed)
SATURATION QUALIFICATIONS: (This note is used to comply with regulatory documentation for home oxygen)  Patient Saturations on Room Air at Rest = 95%  Patient Saturations on Room Air while Ambulating = 87%  Patient Saturations on 2 Liters of oxygen while Ambulating = 95%  Please briefly explain why patient needs home oxygen:desaturation with activity without supplemental oxygen Delaney Meigs, PT [phone]

## 2013-09-12 NOTE — Progress Notes (Signed)
Pt alert and oriented times 4 with forgetfulness at times, repeatedly asking the same question 2 to 3 times, able to communicate needs, ambulated with PT today, see notes, will continue to monitor

## 2013-09-13 ENCOUNTER — Ambulatory Visit (HOSPITAL_BASED_OUTPATIENT_CLINIC_OR_DEPARTMENT_OTHER): Payer: Medicare Other

## 2013-09-13 ENCOUNTER — Ambulatory Visit: Payer: Medicare Other | Admitting: Rehabilitation

## 2013-09-13 ENCOUNTER — Encounter (HOSPITAL_COMMUNITY): Payer: Medicare Other

## 2013-09-13 ENCOUNTER — Ambulatory Visit (HOSPITAL_BASED_OUTPATIENT_CLINIC_OR_DEPARTMENT_OTHER): Admission: RE | Admit: 2013-09-13 | Payer: Medicare Other | Source: Ambulatory Visit

## 2013-09-13 DIAGNOSIS — I2510 Atherosclerotic heart disease of native coronary artery without angina pectoris: Secondary | ICD-10-CM

## 2013-09-13 DIAGNOSIS — I714 Abdominal aortic aneurysm, without rupture: Secondary | ICD-10-CM

## 2013-09-13 DIAGNOSIS — I369 Nonrheumatic tricuspid valve disorder, unspecified: Secondary | ICD-10-CM

## 2013-09-13 DIAGNOSIS — I679 Cerebrovascular disease, unspecified: Secondary | ICD-10-CM | POA: Diagnosis present

## 2013-09-13 DIAGNOSIS — J8410 Pulmonary fibrosis, unspecified: Secondary | ICD-10-CM | POA: Diagnosis present

## 2013-09-13 LAB — GLUCOSE, CAPILLARY: Glucose-Capillary: 124 mg/dL — ABNORMAL HIGH (ref 70–99)

## 2013-09-13 MED ORDER — DIPHENHYDRAMINE HCL 12.5 MG/5ML PO ELIX
12.5000 mg | ORAL_SOLUTION | Freq: Once | ORAL | Status: AC
  Administered 2013-09-13: 22:00:00 12.5 mg via ORAL
  Filled 2013-09-13: qty 5

## 2013-09-13 MED ORDER — PREDNISONE 50 MG PO TABS
60.0000 mg | ORAL_TABLET | Freq: Every day | ORAL | Status: DC
  Administered 2013-09-14: 06:00:00 60 mg via ORAL
  Filled 2013-09-13 (×2): qty 1

## 2013-09-13 NOTE — Progress Notes (Signed)
Echocardiogram 2D Echocardiogram has been performed.  Briana Ray 09/13/2013, 1:50 PM

## 2013-09-13 NOTE — Progress Notes (Signed)
[facility] PROGRESS NOTE   Briana Ray ZOX:[id number] DOB: 10/28/1923 DOA: 09/11/2013 PCP: Danise Edge, MD  Brief narrative: Briana Ray is an 79 y.o. female with a PMH of RA, pulmonary fibrosis with chronic respiratory failure on intermittent home oxygen, CAD, and diabetes who was admitted on 09/11/2013 with a chief complaint of dizziness and dyspnea, thought to be secondary to pneumonia versus bronchitis.  Assessment/Plan: Principal Problem:   Acute-on-chronic respiratory failure / acute bronchitis / pulmonary fibrosis The patient was admitted and placed on empiric IV steroids and antibiotics consisting of Rocephin and azithromycin. She has been afebrile with no leukocytosis. Two-dimensional echocardiography has been requested to rule out underlying CHF. Pro BNP and troponins elevated. Continue Symbicort. Wean Solu-Medrol to prednisone. Active Problems:   Cerebrovascular disease Continue Plavix. Continue risk factor modification.   DIABETES MELLITUS, TYPE II Currently being managed with Glucotrol and moderate scale SSI with 3 units of meal coverage and 5 units of Levemir each bedtime. CBGs 122-182.   HYPERLIPIDEMIA Continue statin therapy.   HYPERTENSION Controlled on metoprolol.   CORONARY HEART DISEASE / elevated troponin Mild elevation of troponins noted. Continue statin and beta blocker. No complaints of chest pain, but does have nitroglycerin sublingual when necessary ordered. Seen by cardiologist. Mild troponin elevation thought to be secondary to demand ischemia. No further cardiac evaluation recommended at this time.   Rheumatoid arthritis(714.0) Continue Plaquenil.   Chronic diastolic CHF (congestive heart failure), NYHA class 2 Seems to be compensated, followup 2-D echocardiogram results.   AAA Currently measures 4.3 X4 0.3. Repeat abdominal duplex scan recommended in 6 months.  Code Status: Full. Family Communication: Daughter at the  bedside. Disposition Plan: Home when stable.   IV access:  Peripheral IV   Medical Consultants:  None.  Other Consultants:  Physical therapy: Outpatient PT recommended.  Anti-infectives:  Rocephin 09/11/2013--->  Azithromycin 09/11/2013--->  HPI/Subjective: Briana Ray denies chest pain, but does complain of a dizzy feeling in her head, and head congestion.  Occasional cough, currently non-productive.  No myalgias.  "I think this is all emotional", describes feeling lonely and dissatisfied with current living situation.  Objective: Filed Vitals:   09/12/13 1431 09/12/13 2103 09/12/13 2104 09/13/13 0500  BP:  113/72  128/74  Pulse:  80  74  Temp:  97.5 F (36.4 C)  97.8 F (36.6 C)  TempSrc:  Oral  Oral  Resp:  18  19  Height:      Weight:    52.8 kg (116 lb 6.5 oz)  SpO2: 95% 98% 97% 96%    Intake/Output Summary (Last 24 hours) at 09/13/13 0807 Last data filed at 09/13/13 0403  Gross per 24 hour  Intake   1130 ml  Output    525 ml  Net    605 ml    Exam: Gen:  NAD Cardiovascular:  RRR, grade 2/6 systolic ejection murmur Respiratory:  Lungs with bibasilar crackles Gastrointestinal:  Abdomen soft, NT/ND, + BS Extremities:  No C/E/C  Data Reviewed: Basic Metabolic Panel:  Recent Labs Lab 09/11/13 1430 09/12/13 0038  NA 137 136  K 3.7 4.2  CL 101 98  CO2 21 23  GLUCOSE 98 209*  BUN 15 22  CREATININE 1.00 1.00  CALCIUM 9.2 9.4   GFR Estimated Creatinine Clearance: 28.5 ml/min (by C-G formula based on Cr of 1). Liver Function Tests:  Recent Labs Lab 09/11/13 1430  AST 38*  ALT 18  ALKPHOS 86  BILITOT 0.5  PROT 7.1  ALBUMIN 3.3*   CBC:  Recent Labs Lab 09/11/13 1430  WBC 7.5  NEUTROABS 5.6  HGB 12.1  HCT 37.3  MCV 93.3  PLT 200   Cardiac Enzymes:  Recent Labs Lab 09/11/13 1430 09/11/13 1940 09/12/13 0038  TROPONINI 0.60* 0.46* 0.34*   BNP (last 3 results)  Recent Labs  09/11/13 1430  PROBNP 4266.0*    CBG:  Recent Labs Lab 09/12/13 0617 09/12/13 1108 09/12/13 1536 09/12/13 2118 09/13/13 0618  GLUCAP 147* 182* 130* 178* 122*   Thyroid function studies  Recent Labs  09/11/13 1940  TSH 0.506    Procedures and Diagnostic Studies: Dg Chest 2 View  09/11/2013   CLINICAL DATA:  Fever, shortness of breath  EXAM: CHEST  2 VIEW  COMPARISON:  12/31/2011; 11/16/2011; 08/05/2011  FINDINGS: Grossly unchanged borderline enlarged cardiac silhouette and mediastinal contours post median sternotomy, CABG and valve replacement. There is persistent tortuosity and possible slight ectasia of the thoracic aorta with associated atherosclerotic calcifications.  Lung volumes are slightly reduced. Pulmonary vasculature is less distinct of the present examination with cephalization of flow. Worsening bibasilar slightly coarsened interstitial opacities. No definite pleural effusion or pneumothorax. Grossly unchanged bones.  IMPRESSION: 1. Findings most suggestive of pulmonary edema superimposed on emphysematous change, though note, underlying atypical infection and/or pulmonary fibrosis is not excluded on the basis of this examination. 2. Worsening bibasilar atelectasis. No focal airspace opacities to suggest pneumonia.   Electronically Signed   By: Simonne Come M.D.   On: 09/11/2013 14:25   Scheduled Meds: . aspirin EC  81 mg Oral Daily  . atorvastatin  20 mg Oral Daily  . azithromycin  500 mg Oral Daily  . budesonide-formoterol  2 puff Inhalation BID  . cefTRIAXone (ROCEPHIN)  IV  1 g Intravenous Q24H  . clopidogrel  75 mg Oral Q breakfast  . enoxaparin (LOVENOX) injection  40 mg Subcutaneous Q24H  . glipiZIDE  2.5 mg Oral Q breakfast  . hydroxychloroquine  300 mg Oral Daily  . insulin aspart  0-15 Units Subcutaneous TID WC  . insulin aspart  0-5 Units Subcutaneous QHS  . insulin aspart  3 Units Subcutaneous TID WC  . insulin detemir  5 Units Subcutaneous QHS  . levothyroxine  50 mcg Oral QAC  breakfast  . methylPREDNISolone (SOLU-MEDROL) injection  80 mg Intravenous Q12H  . metoprolol tartrate  12.5 mg Oral BID   Continuous Infusions:   Time spent: 35 minutes with > 50% of time discussing current diagnostic test results, clinical impression and plan of care.    LOS: 2 days   Tedford Berg  [facility] Pager [phone].   *Please note that the hospitalists switch teams on Wednesdays. Please call the flow manager at [phone] if you are having difficulty reaching the hospitalist taking care of this patient as she can update you and provide the most up-to-date pager number of provider caring for the patient. If 8PM-8AM, please contact night-coverage at [email or web address], password [facility]  09/13/2013, 8:07 AM

## 2013-09-13 NOTE — Consult Note (Signed)
CONSULT NOTE  Date: 09/13/2013               Patient Name:  Briana Ray MRN: [id number]  DOB: [birth year] Age / Sex: 79 y.o., female        PCP: Danise Edge Primary Cardiologist: Jens Som            Referring Physician: Rosine Beat, MD              Reason for Consult: CAD, AAA           History of Present Illness: Patient is a 79 y.o. female with a PMHx of CAD, AAA, dementia, pulmonary fibrosis , who was admitted to [facility] on 09/11/2013 for evaluation of pneumonia.  .   We are asked to see because of a mild troponin elevation in the setting of pneumonia and fevers.  She has seen Dr. Jens Som for follow up of her CAD and AAA.  She has Status post coronary artery bypass graft and valve repair in 1998. Patient had a LIMA to the LAD, saphenous vein graft to PDA, saphenous vein graft to the intermediate and saphenous vein graft to the marginal. Last cardiac catheterization was performed in 2005 and revealed severe three-vessel obstructive coronary artery disease. Patent saphenous vein graft to the posterior descending artery but with high-grade stenosis at the anastomosis site. Patent left internal mammary artery graft to the left anterior descending. Two occluded vein grafts presumably to intermediate and marginal vessels. Mild left ventricular dysfunction. Successful stenting of the saphenous vein graft to the posterior descending artery at the distal anastomosis.  She is very pleasant - very talkative.  She does not have any CP but her activities are limited.  She has no Cp with bringing the groceries in from the car.  Does not do her own housework.    Her initial Troponin was 0.6 and her levels have fallen to 0.34.    She is not having any CP - her breathing is normal.    Medications: Outpatient medications: Prescriptions prior to admission  Medication Sig Dispense Refill  . aspirin EC 81 MG tablet Take 1 tablet (81 mg total) by mouth daily.  90 tablet  3  .  atorvastatin (LIPITOR) 20 MG tablet Take 1 tablet (20 mg total) by mouth daily.  30 tablet  2  . budesonide-formoterol (SYMBICORT) 160-4.5 MCG/ACT inhaler Inhale 2 puffs into the lungs 2 (two) times daily.      . clopidogrel (PLAVIX) 75 MG tablet Take 1 tablet (75 mg total) by mouth daily with breakfast.  30 tablet  5  . glipiZIDE (GLUCOTROL XL) 2.5 MG 24 hr tablet Take 1 tablet (2.5 mg total) by mouth daily.  30 tablet  5  . hydroxychloroquine (PLAQUENIL) 200 MG tablet Take 300 mg by mouth daily.      Marland Kitchen leflunomide (ARAVA) 20 MG tablet Take 10 mg by mouth daily.       Marland Kitchen levothyroxine (SYNTHROID, LEVOTHROID) 50 MCG tablet Take 1 tablet (50 mcg total) by mouth daily before breakfast.  30 tablet  4  . loperamide (IMODIUM) 2 MG capsule Take 2 mg by mouth as needed for diarrhea or loose stools.      . metoprolol tartrate (LOPRESSOR) 25 MG tablet Take 0.5 tablets (12.5 mg total) by mouth 2 (two) times daily.  180 tablet  3  . Multiple Vitamins-Minerals (CENTRUM SILVER PO) Take 1 tablet by mouth daily.       . nitroGLYCERIN (NITROSTAT)  0.4 MG SL tablet Place 1 tablet (0.4 mg total) under the tongue every 5 (five) minutes as needed. For chest pain  25 tablet  6  . Omega-3 Fatty Acids (FISH OIL) 1200 MG CAPS Take 1 capsule by mouth daily.       . OXYGEN-HELIUM IN Inhale 2.5 L/hr into the lungs at bedtime.        Current medications: Current Facility-Administered Medications  Medication Dose Route Frequency Provider Last Rate Last Dose  . acetaminophen (TYLENOL) tablet 650 mg  650 mg Oral Q6H PRN Christiane Ha, MD       Or  . acetaminophen (TYLENOL) suppository 650 mg  650 mg Rectal Q6H PRN Christiane Ha, MD      . albuterol (PROVENTIL) (5 MG/ML) 0.5% nebulizer solution 2.5 mg  2.5 mg Nebulization Q2H PRN Christiane Ha, MD      . alum & mag hydroxide-simeth (MAALOX/MYLANTA) 200-200-20 MG/5ML suspension 30 mL  30 mL Oral Q6H PRN Christiane Ha, MD      . aspirin EC tablet 81 mg  81 mg  Oral Daily Christiane Ha, MD   81 mg at 09/13/13 1015  . atorvastatin (LIPITOR) tablet 20 mg  20 mg Oral Daily Christiane Ha, MD   20 mg at 09/13/13 1015  . azithromycin (ZITHROMAX) tablet 500 mg  500 mg Oral Daily Christiane Ha, MD   500 mg at 09/13/13 1015  . budesonide-formoterol (SYMBICORT) 160-4.5 MCG/ACT inhaler 2 puff  2 puff Inhalation BID Christiane Ha, MD   2 puff at 09/13/13 [phone]  . cefTRIAXone (ROCEPHIN) 1 g in dextrose 5 % 50 mL IVPB  1 g Intravenous Q24H Christiane Ha, MD   1 g at 09/12/13 1316  . clopidogrel (PLAVIX) tablet 75 mg  75 mg Oral Q breakfast Christiane Ha, MD   75 mg at 09/13/13 0620  . enoxaparin (LOVENOX) injection 40 mg  40 mg Subcutaneous Q24H Christiane Ha, MD   40 mg at 09/12/13 2202  . glipiZIDE (GLUCOTROL XL) 24 hr tablet 2.5 mg  2.5 mg Oral Q breakfast Christiane Ha, MD   2.5 mg at 09/13/13 0620  . guaiFENesin-dextromethorphan (ROBITUSSIN DM) 100-10 MG/5ML syrup 5 mL  5 mL Oral Q4H PRN Christiane Ha, MD      . HYDROcodone-acetaminophen (NORCO/VICODIN) 5-325 MG per tablet 1-2 tablet  1-2 tablet Oral Q4H PRN Christiane Ha, MD      . hydroxychloroquine (PLAQUENIL) tablet 300 mg  300 mg Oral Daily Christiane Ha, MD   300 mg at 09/13/13 1015  . insulin aspart (novoLOG) injection 0-15 Units  0-15 Units Subcutaneous TID WC Marinda Elk, MD   2 Units at 09/13/13 [phone]  . insulin aspart (novoLOG) injection 0-5 Units  0-5 Units Subcutaneous QHS Marinda Elk, MD      . insulin aspart (novoLOG) injection 3 Units  3 Units Subcutaneous TID WC Marinda Elk, MD   3 Units at 09/13/13 0800  . insulin detemir (LEVEMIR) injection 5 Units  5 Units Subcutaneous QHS Marinda Elk, MD   5 Units at 09/12/13 2203  . levothyroxine (SYNTHROID, LEVOTHROID) tablet 50 mcg  50 mcg Oral QAC breakfast Christiane Ha, MD   50 mcg at 09/13/13 [phone]  . loperamide (IMODIUM A-D) tablet 2 mg  2 mg Oral PRN Christiane Ha, MD   2 mg at 09/13/13 [phone]  . methylPREDNISolone sodium succinate (SOLU-MEDROL) 125 mg/2  mL injection 80 mg  80 mg Intravenous Q12H Marinda Elk, MD   80 mg at 09/12/13 2203  . metoprolol tartrate (LOPRESSOR) tablet 12.5 mg  12.5 mg Oral BID Christiane Ha, MD   12.5 mg at 09/13/13 1015  . nitroGLYCERIN (NITROSTAT) SL tablet 0.4 mg  0.4 mg Sublingual Q5 min PRN Christiane Ha, MD      . ondansetron [facility]) tablet 4 mg  4 mg Oral Q6H PRN Christiane Ha, MD       Or  . ondansetron (ZOFRAN) injection 4 mg  4 mg Intravenous Q6H PRN Christiane Ha, MD         Allergies  Allergen Reactions  . Amoxicillin-Pot Clavulanate     REACTION: diarrhea     Past Medical History  Diagnosis Date  . Coronary heart disease   . CVA (cerebral infarction) 04/25/10    MRI L parietal lobe infarction   . Hyperlipidemia   . Diabetes mellitus, type 2   . GERD (gastroesophageal reflux disease)   . 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 Rheumatoid arthritis(714.0)     on Arava. Dx for 20 yrs  . Aneurysm     "on my heart; dr said I didn't have to worry about it, it's not big enough yet"  . Hypertension   . Myocardial infarction 1998; 2005  . Pneumonia 11/16/11    "first time ever"  . Anemia   . Blood transfusion   . Stroke   . Diverticulitis 2008  . Debility 07/23/2013    Past Surgical History  Procedure Laterality Date  . Breast reduction surgery    . Cholecystectomy    . Appendectomy    . Tonsillectomy    . Vesicovaginal fistula closure w/ tah    . Carpal tunnel release      left  . Coronary angioplasty with stent placement  ~ 2002    "1"  . Hernia repair      umbilical  . Abdominal hysterectomy    . Cataract extraction w/ intraocular lens  implant, bilateral    . Coronary artery bypass graft  1998    vCABG X4 w/alve repair  . Breast surgery      reduction    Family History  Problem Relation Age of Onset  .  Arthritis Mother   . Other Brother     lung issues- oxygen  . Pneumonia Brother   . Diabetes Sister   . Hyperlipidemia Sister   . Hypertension Sister   . Stroke Sister   . Kidney disease Father   . Fibromyalgia Daughter   . Cancer Son     colon  . Cancer Sister     colon?  . Dementia Sister   . Alcohol abuse Brother   . Aneurysm Brother   . Heart disease Brother     Social History:  reports that she has never smoked. She has never used smokeless tobacco. She reports that she does not drink alcohol or use illicit drugs.   Review of Systems: Constitutional:  denies fever, chills, diaphoresis, appetite change and fatigue.  HEENT: denies photophobia, eye pain, redness, hearing loss, ear pain, congestion, sore throat, rhinorrhea, sneezing, neck pain, neck stiffness and tinnitus.  Respiratory: denies SOB, DOE, cough, chest tightness, and wheezing.  Cardiovascular: denies chest pain, palpitations and leg swelling.  Gastrointestinal: denies nausea, vomiting, abdominal pain, diarrhea, constipation, blood in stool.  Genitourinary: denies dysuria, urgency,  frequency, hematuria, flank pain and difficulty urinating.  Musculoskeletal: denies  myalgias, back pain, joint swelling, arthralgias and gait problem.   Skin: denies pallor, rash and wound.  Neurological: denies dizziness, seizures, syncope, weakness, light-headedness, numbness and headaches.   Hematological: denies adenopathy, easy bruising, personal or family bleeding history.  Psychiatric/ Behavioral: denies suicidal ideation, mood changes, confusion, nervousness, sleep disturbance and agitation.    Physical Exam: BP 128/74  Pulse 74  Temp(Src) 97.8 F (36.6 C) (Oral)  Resp 19  Ht 5' (1.524 m)  Wt 116 lb 6.5 oz (52.8 kg)  BMI 22.73 kg/m2  SpO2 96%  General: Vital signs reviewed and noted. Well-developed, well-nourished, in no acute distress; alert,  Elderly - may have some mild dementia  Head: Normocephalic, atraumatic,  sclera anicteric, mucus membranes are moist  Neck: Supple. Negative for carotid bruits. JVD not elevated.  Lungs:  Fibrotic rales bilaterally  Heart: RRR with S1 S2.  , soft systolic murmur  Abdomen:  Soft, non-tender, non-distended with normoactive bowel sounds. No hepatomegaly. No rebound/guarding. No obvious abdominal masses  MSK: Strength and the appear normal for age.  Extremities: No clubbing or cyanosis. No edema.  Distal pedal pulses are 2+ and equal bilaterally.  Neurologic: Alert and oriented X 3. Moves all extremities spontaneously.  Psych: Responds to questions appropriately with a normal affect.    Lab results: Basic Metabolic Panel:  Recent Labs Lab 09/11/13 1430 09/12/13 0038  NA 137 136  K 3.7 4.2  CL 101 98  CO2 21 23  GLUCOSE 98 209*  BUN 15 22  CREATININE 1.00 1.00  CALCIUM 9.2 9.4    Liver Function Tests:  Recent Labs Lab 09/11/13 1430  AST 38*  ALT 18  ALKPHOS 86  BILITOT 0.5  PROT 7.1  ALBUMIN 3.3*   No results found for this basename: LIPASE, AMYLASE,  in the last 168 hours No results found for this basename: AMMONIA,  in the last 168 hours  CBC:  Recent Labs Lab 09/11/13 1430  WBC 7.5  NEUTROABS 5.6  HGB 12.1  HCT 37.3  MCV 93.3  PLT 200    Cardiac Enzymes:  Recent Labs Lab 09/11/13 1430 09/11/13 1940 09/12/13 0038  TROPONINI 0.60* 0.46* 0.34*    BNP: No components found with this basename: POCBNP,   CBG:  Recent Labs Lab 09/12/13 0617 09/12/13 1108 09/12/13 1536 09/12/13 2118 09/13/13 0618  GLUCAP 147* 182* 130* 178* 122*    Coagulation Studies: No results found for this basename: LABPROT, INR,  in the last 72 hours   Other results:  EKG  : NSR Inf TWI, old ant. MI   Imaging: Dg Chest 2 View  09/11/2013   CLINICAL DATA:  Fever, shortness of breath  EXAM: CHEST  2 VIEW  COMPARISON:  12/31/2011; 11/16/2011; 08/05/2011  FINDINGS: Grossly unchanged borderline enlarged cardiac silhouette and mediastinal  contours post median sternotomy, CABG and valve replacement. There is persistent tortuosity and possible slight ectasia of the thoracic aorta with associated atherosclerotic calcifications.  Lung volumes are slightly reduced. Pulmonary vasculature is less distinct of the present examination with cephalization of flow. Worsening bibasilar slightly coarsened interstitial opacities. No definite pleural effusion or pneumothorax. Grossly unchanged bones.  IMPRESSION: 1. Findings most suggestive of pulmonary edema superimposed on emphysematous change, though note, underlying atypical infection and/or pulmonary fibrosis is not excluded on the basis of this examination. 2. Worsening bibasilar atelectasis. No focal airspace opacities to suggest pneumonia.   Electronically Signed   By: Jonny Ruiz  Watts M.D.   On: 09/11/2013 14:25       Assessment & Plan:  1. CAD:  She has a hx of CAD - s/p CABG - last cath was in 2012 which revealed severe native 3 V CAD, patent IMA to LAD.  The 2 SVGs to ramus and marginal vessels are occluded.  She had PCI of the SVG to PDA.    The topronin levels are very low and are decreasing as her pneumonia is treated.  I suspect this was due to demand ischemia in the setting of severe CAD at baseline.  Fortunately, she is very comfortable - is not having any pain at present.   I do not think she needs any further evaluation for this at this time.   A myoview would most likely be abnormal based on what we know about her CAD in 2012.    2. AAA:  Currently her AAA measures 4.3 x 4.3 .,  She will be moving down to [place] with her daughter who has already arranged a doctor for her .  She should have a repeat abdomain duplex scan in 6 months.  If / when her aneurism grows to the point that she should be considered for repair, pre-op assessment should be made at that time.    3. Pulmonary Firbosis:       Vesta Mixer, Montez Hageman., MD, [facility] 09/13/2013, 12:14 PM

## 2013-09-13 NOTE — Telephone Encounter (Signed)
Discussed with dr Jens Som, Spoke with pt son, andy, explained dr Jens Som would like to talk to the pt before deciding about future treatments. i had called to set up an appointment and he informed me the pt was currently admitted for pneumonia and they had told him her troponin was elevated. He would like for cardiology to be involved in her care prior to leaving the hosp. cardmaster aware and they will call the son once she is seen. Pt agreed with this plan.

## 2013-09-13 NOTE — Progress Notes (Signed)
PT Cancellation Note  Patient Details Name: Briana Ray MRN: [id number] DOB: 22-Jul-1924   Cancelled Treatment:    Reason Eval/Treat Not Completed: Patient at procedure or test/unavailable.  Attempted to see pt, but with 2 different MDs on attempt.  Nurse reports she did walk with her earlier and she walked to nursing station.     [name] 09/13/2013, 1:29 PM

## 2013-09-13 NOTE — Care Management Note (Addendum)
    Page 1 of 2   09/20/2013     10:55:51 AM   CARE MANAGEMENT NOTE 09/20/2013  Patient:  Briana Ray, Briana Ray   Account Number:  [id number]  Date Initiated:  09/12/2013  Documentation initiated by:  HUTCHINSON,CRYSTAL  Subjective/Objective Assessment:   Admitted with weakness, dizziness, shortness of breath, elevated BP, COPD, and hx/o CHF     Action/Plan:   CM will ocntinue to follow for disposiiton needs.   Lives home alone.   Anticipated DC Date:  09/15/2013   Anticipated DC Plan:        DC Planning Services  CM consult      Choice offered to / List presented to:  C-1 Patient           Status of service:  Completed, signed off Medicare Important Message given?   (If response is "NO", the following Medicare IM given date fields will be blank) Date Medicare IM given:   Date Additional Medicare IM given:    Discharge Disposition:  HOME/SELF CARE  Per UR Regulation:  Reviewed for med. necessity/level of care/duration of stay  If discussed at Long Length of Stay Meetings, dates discussed:    Comments:  09/20/2013  Post D/c CM Note: Request for O2 order received via fax 09/19/2013 5:48pm Hx/o patient on home oxygen prior to admission with [facility]. Hx/o PT summary and d/c summary previously faxed to [facility] 09/13/2013. CM reviewed MR and noted per PT note:  SpO2 90-98% on RA, 96-98% on 1L with activity. CM contacted [facility] who instructs that Christoper Allegra will need to get new certification to begin new service. D/C order to resume home oxygen at 2.5 L/m via [place]. Please consult PCP for any new order needed to recertify for continued use of home oxygen. Fax sent to [employer] with the above update. Thanks, [employer] RN, BSN, [place], [place] [phone] 10:17am.   09/13/2013 CM Consult:  Oxygen for transport to [facility] Please check with the home health company that provides her oxygen and make them aware that she will need oxygen to travel to [place] to see how to  facilitate this. Oxygen Millee Denise:  [facility] CM has attempted contact with [facility]/contact Darion for clariification. Outcome:  Darion instructs to contact international Allene Furuya [employer]. [employer]/Contact Cathlean Cower contacted [phone] Christoper Allegra instructs will need H&P, Progress note and PT note with O2 sats faxed to Christoper Allegra [phone]. (Faxed by CM) Plan:  Oxygen will be established in [place], [place] location prior to transport date. Oxygne coverage will be maintained with [facility] in [facility] RN, BSN, [place], [place] 09/14/2013 12:27 pm / completed 1:11pm   09/13/2013 CM Consult:  OP PT Social:  Patient plans to move to [facility] to live with PCG/DTR/Mary Kasper [phone] [address], [place], [place] [address] once DTR has moved patient out of her home. DME:  O2 in the home active with [facility], walker Patient and DTR state interest in Vestibular Therapy to manage s/s of dizziness but states regular PT only makes condition worse. Elects OP PT services to be initiated with [facility], [address] location. [phone] -CM suggested use of AMD: reacher to assist with picking up items from floor and provided VE on Trigger points which could increase risk of falls. Crystal Hutchinson RN, BSN, MSHL, CCM 09/13/2013 2:36pm

## 2013-09-14 ENCOUNTER — Encounter: Payer: Medicare Other | Admitting: Rehabilitation

## 2013-09-14 ENCOUNTER — Other Ambulatory Visit (HOSPITAL_COMMUNITY): Payer: Self-pay | Admitting: Internal Medicine

## 2013-09-14 DIAGNOSIS — I5043 Acute on chronic combined systolic (congestive) and diastolic (congestive) heart failure: Secondary | ICD-10-CM

## 2013-09-14 DIAGNOSIS — R7989 Other specified abnormal findings of blood chemistry: Secondary | ICD-10-CM

## 2013-09-14 LAB — GLUCOSE, CAPILLARY
Glucose-Capillary: 63 mg/dL — ABNORMAL LOW (ref 70–99)
Glucose-Capillary: 95 mg/dL (ref 70–99)

## 2013-09-14 MED ORDER — FUROSEMIDE 20 MG PO TABS: 20.0000 mg | ORAL_TABLET | Freq: Every day | ORAL | Status: AC | PRN

## 2013-09-14 MED ORDER — PREDNISONE (PAK) 10 MG PO TABS: ORAL_TABLET | ORAL | Status: DC

## 2013-09-14 MED ORDER — LEVOFLOXACIN 500 MG PO TABS: 500.0000 mg | ORAL_TABLET | Freq: Every day | ORAL | Status: DC

## 2013-09-14 MED ORDER — FUROSEMIDE 20 MG PO TABS
20.0000 mg | ORAL_TABLET | Freq: Once | ORAL | Status: AC
  Administered 2013-09-14: 20 mg via ORAL
  Filled 2013-09-14: qty 1

## 2013-09-14 NOTE — Progress Notes (Signed)
Hypoglycemic Event  CBG: 63  Treatment: 15 GM carbohydrate snack  Symptoms: Hungry  Follow-up CBG: Time:0651 CBG Result:95  Possible Reasons for Event: Unknown  Comments/MD notified: morning sliding scale insulin held    Briana Ray  Remember to initiate Hypoglycemia Order Set & complete

## 2013-09-14 NOTE — Progress Notes (Deleted)
Physical Therapy Treatment Patient Details Name: Briana Ray MRN: [id number] DOB: March 10, 1924 Today's Date: 09/14/2013 Time:  -     PT Assessment / Plan / Recommendation  History of Present Illness     PT Comments   Pt standing up with nurse performing ADLs without the use of RW. Instructed the need to use AD all the time. She exhibits decreases safety with RW (navigating obstacles, placement of body inside.) Elements of DGI assessed and confirm dynamic balance deficits. Pt can benefit from continued PT services to address impairments in gait and for  safer functional mobility.  Follow Up Recommendations        Does the patient have the potential to tolerate intense rehabilitation     Barriers to Discharge        Equipment Recommendations       Recommendations for Other Services    Frequency     Progress towards PT Goals    Plan      Precautions / Restrictions Restrictions Weight Bearing Restrictions: No   Pertinent Vitals/Pain SpO2 90-98% on RA, 96-98% on 1L with activity;      Mobility       Exercises     PT Diagnosis:    PT Problem List:   PT Treatment Interventions:     PT Goals (current goals can now be found in the care plan section)    Visit Information       Subjective Data      Cognition       Balance     End of Session     GP     Willette Pa, SPT 09/14/2013, 12:19 PM

## 2013-09-14 NOTE — Progress Notes (Signed)
Pt is being discharged home. Pt has been provided with discharge instructions. RN answered all questions the patient had about the discharge instructions. Awaiting for daughter to come pick the patient up

## 2013-09-14 NOTE — Assessment & Plan Note (Signed)
Pt was on aspirin at time of stroke.  Will add Plavix.  We discussed increased risk of GI bleed with both plavix and aspirin. Plan to continue both and possible d/c aspirin in a few weeks after she is stable and has established with her new PCP in [place].  Daughter verbalizes understanding.  Will obtain 2D echo, carotid duplex as part of stroke eval.   Her last LDL as in October 2013.  Unfortunately she is not fasting today.  She will need FLP next visit. Continue Lipitor.  Case discussed with Dr. Abner Greenspan.  Lab Results  Component Value Date   CHOL 163 08/10/2012   HDL 42 08/10/2012   LDLCALC 85 08/10/2012   TRIG 181* 08/10/2012   CHOLHDL 3.9 08/10/2012

## 2013-09-14 NOTE — Progress Notes (Signed)
    Subjective:  Denies CP; dyspnea and cough improved.   Objective:  Filed Vitals:   09/13/13 2046 09/13/13 2137 09/14/13 0416 09/14/13 0546  BP: 132/78   153/75  Pulse: 75 75  58  Temp: 97.7 F (36.5 C)   97.5 F (36.4 C)  TempSrc: Oral   Oral  Resp: 18   18  Height:      Weight:   117 lb 1.6 oz (53.116 kg)   SpO2: 98%   99%    Intake/Output from previous day:  Intake/Output Summary (Last 24 hours) at 09/14/13 0709 Last data filed at 09/14/13 0416  Gross per 24 hour  Intake   1010 ml  Output   1525 ml  Net   -515 ml    Physical Exam: Physical exam: Well-developed frail in no acute distress.  Skin is warm and dry.  HEENT is normal.  Neck is supple.  Chest basilar dry crackles Cardiovascular exam is regular rate and rhythm.  Abdominal exam nontender or distended. No masses palpated. Extremities show no edema. neuro grossly intact    Lab Results: Basic Metabolic Panel:  Recent Labs  16/10/96 1430 09/12/13 0038  NA 137 136  K 3.7 4.2  CL 101 98  CO2 21 23  GLUCOSE 98 209*  BUN 15 22  CREATININE 1.00 1.00  CALCIUM 9.2 9.4   CBC:  Recent Labs  09/11/13 1430  WBC 7.5  NEUTROABS 5.6  HGB 12.1  HCT 37.3  MCV 93.3  PLT 200   Cardiac Enzymes:  Recent Labs  09/11/13 1430 09/11/13 1940 09/12/13 0038  TROPONINI 0.60* 0.46* 0.34*     Assessment/Plan:  1. CAD: She has a hx of CAD - s/p CABG - last cath was in 2012 which revealed severe native 3 V CAD, patent IMA to LAD. The 2 SVGs to ramus and marginal vessels are occluded. She had PCI of the SVG to PDA.  The topronin levels are very low and are decreasing as her pneumonia is treated. I suspect this was due to demand ischemia in the setting of severe CAD at baseline. Fortunately, she is very comfortable - is not having any pain at present. I do not think she needs any further evaluation for this at this time. A myoview would most likely be abnormal based on what we know about her CAD in 2012.  Would continue medical therapy including ASA, plavix, statin and beta blocker. Will give one dose of lasix this AM. 2. AAA: Currently her AAA measures 4.3 x 4.3 ., She will be moving down to [place] with her daughter who has already arranged a doctor for her . She should have a repeat abdomain duplex scan in June. If / when her aneurism grows to the point that she should be considered for repair (hopefully stent graft given multiple medical problems), pre-op assessment should be made at that time.  3. Pneumonia-management per primary care.   Olga Millers 09/14/2013, 7:09 AM

## 2013-09-14 NOTE — Progress Notes (Signed)
PT Progress Note  Pt standing up with nurse performing ADLs without the use of RW. Instructed the need to use AD all the time. She exhibits decreases safety with RW (navigating obstacles, placement of body inside.) Elements of DGI assessed and confirm dynamic balance deficits. Pt can benefit from continued PT services to address impairments in gait and for safer functional mobility.  SpO2 90-98% on RA, 96-98% on 1L with activity;    09/14/13 0800  PT Visit Information  Last PT Received On 09/14/13  Assistance Needed +1  History of Present Illness 79 y.o. female with h/o RA and pulmonary fibrosis, CAD on intermittent oxygen presents to [facility] with dizziness and dyspnea. Also cough for about a week. No chest pain. In ED, temp 101.2. WBC normal. CXR shows pulmonary edema, v. Pulmonary fibrosis v. Atypical infection. No myalgias, sore throat, rhinorhea, N/V/D. Also, proBNP >4K and troponin 0.6. No chest pain or ischemic changes on EKG.  PT Time Calculation  PT Start Time 0730  PT Stop Time 0758  PT Time Calculation (min) 28 min  Subjective Data  Patient Stated Goal move to [facility] with her dgtr  Precautions  Precautions Fall  Restrictions  Weight Bearing Restrictions No  Cognition  Arousal/Alertness Awake/alert  Behavior During Therapy WFL for tasks assessed/performed  Overall Cognitive Status Within Functional Limits for tasks assessed  Bed Mobility  Bed Mobility Not assessed  Details for Bed Mobility Assistance pt standing up performing ADLs with nurse  Transfers  Transfers Stand to Sit  Stand to Sit 5: Supervision;To chair/3-in-1;With upper extremity assist  Details for Transfer Assistance cueing needed to stay with the RW until positioned in from of recliner with hands on armrests  Ambulation/Gait  Ambulation/Gait Assistance 5: Supervision  Ambulation Distance (Feet) 200 Feet  Assistive device Rolling walker  Ambulation/Gait Assistance Details cues to stand inside RW, cues for  directing the RW  Gait Pattern Step-through pattern;Decreased stride length;Trunk flexed  Gait velocity 20'/27sec or .36ft/sec decreased  Stairs No  Balance  Balance Assessed Yes  Standardized Balance Assessment  Standardized Balance Assessment Dynamic Gait Index  Dynamic Gait Index  Level Surface 2  Change in Gait Speed 1  Gait with Horizontal Head Turns 2  Gait with Vertical Head Turns 1  Gait and Pivot Turn 1  Step Over Obstacle 1  Step Around Obstacles 0  Exercises  Exercises General Lower Extremity  General Exercises - Lower Extremity  Ankle Circles/Pumps AROM;Both;10 reps;Seated  Hip ABduction/ADduction AROM;Both;10 reps;Seated  Hip Flexion/Marching AROM;Both;10 reps;Seated  Toe Raises AROM;Both;10 reps;Seated  Heel Raises AROM;Both;10 reps;Seated  PT - End of Session  Equipment Utilized During Treatment Gait belt;Oxygen  Activity Tolerance Patient tolerated treatment well  Patient left with nursing/sitter in room;with call bell/phone within reach;in chair  Nurse Communication Mobility status  PT - Assessment/Plan  PT Plan Current plan remains appropriate  PT Frequency Min 3X/week  Follow Up Recommendations Outpatient PT  PT equipment Rolling walker with 5" wheels  PT Goal Progression  Progress towards PT goals Progressing toward goals    Willette Pa, SPT

## 2013-09-14 NOTE — Progress Notes (Signed)
Seen and agree with SPT note Takeela Peil Tabor Kaegan Hettich, PT [phone]  

## 2013-09-14 NOTE — Discharge Summary (Signed)
Physician Discharge Summary  Briana Ray ZHY:[id number] DOB: 12-23-1923 DOA: 09/11/2013  PCP: Danise Edge, MD  Admit date: 09/11/2013 Discharge date: 09/14/2013  Recommendations for Outpatient Follow-up:  1. Outpatient ultrasound to followup on a AAA recommended 02/2013. 2. Note: The patient had a decrease in her EF is being sent home on Lasix therapy to use as needed for greater than 2 pound weight gain/24 hours.  Discharge Diagnoses:  Principal Problem:    Acute-on-chronic respiratory failure Active Problems:    DIABETES MELLITUS, TYPE II    HYPERLIPIDEMIA    HYPERTENSION    CORONARY HEART DISEASE    Rheumatoid arthritis(714.0)    Abdominal aortic aneurysm    Acute on chronic combined systolic and diastolic CHF, NYHA class 2    Acute bronchitis    Cerebrovascular disease    Pulmonary fibrosis    Elevated troponin   Discharge Condition: Improved.  Diet recommendation: Low-sodium, heart healthy, carbohydrate modified.  History of present illness:  Briana Ray is an 79 y.o. female with a PMH of RA, pulmonary fibrosis with chronic respiratory failure on intermittent home oxygen, CAD, and diabetes who was admitted on 09/11/2013 with a chief complaint of dizziness and dyspnea, thought to be secondary to pneumonia versus bronchitis.  Hospital Course by problem:  Principal Problem:  Acute-on-chronic respiratory failure / acute bronchitis / pulmonary fibrosis  The patient was admitted and placed on empiric IV steroids and antibiotics consisting of Rocephin and azithromycin. She has been afebrile with no leukocytosis. Two-dimensional echocardiography has been requested to rule out underlying CHF. Pro BNP and troponins elevated. Wean Solu-Medrol to prednisone. We'll discharge home on an additional 4 days of therapy with Levaquin and a prednisone taper. Active Problems:  Cerebrovascular disease  Continue Plavix. Continue risk factor modification.   DIABETES MELLITUS, TYPE II  Currently being managed with Glucotrol and moderate scale SSI with 3 units of meal coverage and 5 units of Levemir each bedtime. CBGs 122-182. Resume Glucotrol at home dose on discharge. HYPERLIPIDEMIA  Continue statin therapy.  HYPERTENSION  Controlled on metoprolol.  CORONARY HEART DISEASE / elevated troponin  Mild elevation of troponins noted. Continue statin and beta blocker. No complaints of chest pain, but does have nitroglycerin sublingual when necessary ordered. Seen by cardiologist. Mild troponin elevation thought to be secondary to demand ischemia. No further cardiac evaluation recommended at this time.  Rheumatoid arthritis(714.0)  Continue Plaquenil.  Acute on Chronic systolic and diastolic CHF (congestive heart failure), NYHA class 2  2-D echocardiogram confirms a reduction in her EF from prior values. We'll discharge home on Lasix to use as needed for 2 pound weight gain or worsening dyspnea. AAA  Currently measures 4.3 X4 0.3. Repeat abdominal duplex scan recommended in 6 months.  Procedures:  2-D echocardiogram 09/13/2013: EF 40-45%, grade 1 diastolic dysfunction.  Consultations:  Cardiology  Discharge Exam: Filed Vitals:   09/14/13 0546  BP: 153/75  Pulse: 58  Temp: 97.5 F (36.4 C)  Resp: 18   Filed Vitals:   09/13/13 2046 09/13/13 2137 09/14/13 0416 09/14/13 0546  BP: 132/78   153/75  Pulse: 75 75  58  Temp: 97.7 F (36.5 C)   97.5 F (36.4 C)  TempSrc: Oral   Oral  Resp: 18   18  Height:      Weight:   53.116 kg (117 lb 1.6 oz)   SpO2: 98%   99%    Gen:  NAD Cardiovascular:  RRR, 2/6 systolic ejection murmur Respiratory: Lungs with crackles  in the bases Gastrointestinal: Abdomen soft, NT/ND with normal active bowel sounds. Extremities: No C/E/C   Discharge Instructions  Discharge Orders   Future Appointments Provider Department Dept Phone   09/29/2013 11:30 AM Glendale Chard, MD [facility] Neurology [facility]  [phone]   Future Orders Complete By Expires   (HEART FAILURE PATIENTS) Call MD:  Anytime you have any of the following symptoms: 1) 3 pound weight gain in 24 hours or 5 pounds in 1 week 2) shortness of breath, with or without a dry hacking cough 3) swelling in the hands, feet or stomach 4) if you have to sleep on extra pillows at night in order to breathe.  As directed    Diet - low sodium heart healthy  As directed    Discharge instructions  As directed    Comments:     You were cared for by Dr. Hillery Aldo  (a hospitalist) during your hospital stay. If you have any questions about your discharge medications or the care you received while you were in the hospital after you are discharged, you can call the unit and ask to speak with the hospitalist on call if the hospitalist that took care of you is not available. Once you are discharged, your primary care physician will handle any further medical issues. Please note that NO REFILLS for any discharge medications will be authorized once you are discharged, as it is imperative that you return to your primary care physician (or establish a relationship with a primary care physician if you do not have one) for your aftercare needs so that they can reassess your need for medications and monitor your lab values.  Any outstanding tests can be reviewed by your PCP at your follow up visit.  It is also important to review any medicine changes with your PCP.  Please bring these d/c instructions with you to your next visit so your physician can review these changes with you.  If you do not have a primary care physician, you can call [phone] for a physician referral.  It is highly recommended that you obtain a PCP for hospital follow up.   Increase activity slowly  As directed        Medication List         aspirin EC 81 MG tablet  Take 1 tablet (81 mg total) by mouth daily.     atorvastatin 20 MG tablet  Commonly known as:  LIPITOR  Take 1 tablet  (20 mg total) by mouth daily.     budesonide-formoterol 160-4.5 MCG/ACT inhaler  Commonly known as:  SYMBICORT  Inhale 2 puffs into the lungs 2 (two) times daily.     CENTRUM SILVER PO  Take 1 tablet by mouth daily.     clopidogrel 75 MG tablet  Commonly known as:  PLAVIX  Take 1 tablet (75 mg total) by mouth daily with breakfast.     Fish Oil 1200 MG Caps  Take 1 capsule by mouth daily.     furosemide 20 MG tablet  Commonly known as:  LASIX  Take 1 tablet (20 mg total) by mouth daily as needed (Take daily as needed if you gain more than 2 pounds in one day.).     glipiZIDE 2.5 MG 24 hr tablet  Commonly known as:  GLUCOTROL XL  Take 1 tablet (2.5 mg total) by mouth daily.     hydroxychloroquine 200 MG tablet  Commonly known as:  PLAQUENIL  Take 300 mg by mouth daily.  leflunomide 20 MG tablet  Commonly known as:  ARAVA  Take 10 mg by mouth daily.     levofloxacin 500 MG tablet  Commonly known as:  LEVAQUIN  Take 1 tablet (500 mg total) by mouth daily.     levothyroxine 50 MCG tablet  Commonly known as:  SYNTHROID, LEVOTHROID  Take 1 tablet (50 mcg total) by mouth daily before breakfast.     loperamide 2 MG capsule  Commonly known as:  IMODIUM  Take 2 mg by mouth as needed for diarrhea or loose stools.     metoprolol tartrate 25 MG tablet  Commonly known as:  LOPRESSOR  Take 0.5 tablets (12.5 mg total) by mouth 2 (two) times daily.     nitroGLYCERIN 0.4 MG SL tablet  Commonly known as:  NITROSTAT  Place 1 tablet (0.4 mg total) under the tongue every 5 (five) minutes as needed. For chest pain     OXYGEN-HELIUM IN  Inhale 2.5 L/hr into the lungs at bedtime.     predniSONE 10 MG tablet  Commonly known as:  STERAPRED UNI-PAK  Take as directed.           Follow-up Information   Please follow up. (Out-patient Physical Therapy:  Provider:  [facility] [address] Holl[place] Gong [place], [place] [address]  Phone:  [phone])       Follow up  with Danise Edge, MD. Schedule an appointment as soon as possible for a visit in 1 week.   Specialty:  Family Medicine   Contact information:   [address] [place] [address] [phone]        The results of significant diagnostics from this hospitalization (including imaging, microbiology, ancillary and laboratory) are listed below for reference.    Significant Diagnostic Studies: Dg Chest 2 View  09/11/2013   CLINICAL DATA:  Fever, shortness of breath  EXAM: CHEST  2 VIEW  COMPARISON:  12/31/2011; 11/16/2011; 08/05/2011  FINDINGS: Grossly unchanged borderline enlarged cardiac silhouette and mediastinal contours post median sternotomy, CABG and valve replacement. There is persistent tortuosity and possible slight ectasia of the thoracic aorta with associated atherosclerotic calcifications.  Lung volumes are slightly reduced. Pulmonary vasculature is less distinct of the present examination with cephalization of flow. Worsening bibasilar slightly coarsened interstitial opacities. No definite pleural effusion or pneumothorax. Grossly unchanged bones.  IMPRESSION: 1. Findings most suggestive of pulmonary edema superimposed on emphysematous change, though note, underlying atypical infection and/or pulmonary fibrosis is not excluded on the basis of this examination. 2. Worsening bibasilar atelectasis. No focal airspace opacities to suggest pneumonia.   Electronically Signed   By: Simonne Come M.D.   On: 09/11/2013 14:25   Mr Brain Wo Contrast  09/02/2013   CLINICAL DATA:  Dizziness with syncope.  History of prior stroke.  EXAM: MRI HEAD WITHOUT CONTRAST  TECHNIQUE: Multiplanar, multiecho pulse sequences of the brain and surrounding structures were obtained without intravenous contrast.  COMPARISON:  04/25/2010.  FINDINGS: Tiny acute infarct right parietal lobe.  Prominent small vessel disease type changes.  Remote small left vermian infarct. Remote left parietal lobe tiny  infarct. Remote tiny left caudate/left lenticular nucleus infarct.  Global atrophy. Ventricular prominence unchanged and may be related to atrophy although difficult to completely exclude mild component of hydrocephalus.  No intracranial mass lesion noted on this unenhanced exam.  No intracranial hemorrhage.  Poor delineation of the right vertebral artery which may be occluded.  Left vertebral artery,  basilar artery and internal carotid arteries are patent.  Minimal to mild mucosal thickening ethmoid sinus air cells.  Partially empty sella felt to be incidental. Cervical medullary junction, pineal region and orbital structures unremarkable.  IMPRESSION: Tiny acute infarct right parietal lobe.  Prominent small vessel disease type changes.  Remote infarcts as noted above.  Global atrophy and ventricular prominence without significant change.  Poor delineation of the right vertebral artery which may be occluded.  These results will be called to the ordering clinician or representative by the Radiologist Assistant, and communication documented in the PACS Dashboard.   Electronically Signed   By: Bridgett Larsson M.D.   On: 09/02/2013 15:47    Labs:  Basic Metabolic Panel:  Recent Labs Lab 09/11/13 1430 09/12/13 0038  NA 137 136  K 3.7 4.2  CL 101 98  CO2 21 23  GLUCOSE 98 209*  BUN 15 22  CREATININE 1.00 1.00  CALCIUM 9.2 9.4   GFR Estimated Creatinine Clearance: 28.5 ml/min (by C-G formula based on Cr of 1). Liver Function Tests:  Recent Labs Lab 09/11/13 1430  AST 38*  ALT 18  ALKPHOS 86  BILITOT 0.5  PROT 7.1  ALBUMIN 3.3*   CBC:  Recent Labs Lab 09/11/13 1430  WBC 7.5  NEUTROABS 5.6  HGB 12.1  HCT 37.3  MCV 93.3  PLT 200   Cardiac Enzymes:  Recent Labs Lab 09/11/13 1430 09/11/13 1940 09/12/13 0038  TROPONINI 0.60* 0.46* 0.34*   BNP: No components found with this basename: POCBNP,  CBG:  Recent Labs Lab 09/13/13 0618 09/13/13 1548 09/13/13 2137 09/14/13 0605  09/14/13 0651  GLUCAP 122* 168* 124* 63* 95   Thyroid function studies  Recent Labs  09/11/13 1940  TSH 0.506    Time coordinating discharge: 35 minutes.  Signed:  Benedicta Sultan  Pager [phone] [facility] 09/14/2013, 11:40 AM

## 2013-09-20 ENCOUNTER — Encounter: Payer: Self-pay | Admitting: Physician Assistant

## 2013-09-20 ENCOUNTER — Ambulatory Visit (HOSPITAL_BASED_OUTPATIENT_CLINIC_OR_DEPARTMENT_OTHER)
Admission: RE | Admit: 2013-09-20 | Discharge: 2013-09-20 | Disposition: A | Discharge status: Home / Self Care | Payer: Medicare Other | Source: Ambulatory Visit | Attending: Physician Assistant | Admitting: Physician Assistant

## 2013-09-20 ENCOUNTER — Ambulatory Visit (INDEPENDENT_AMBULATORY_CARE_PROVIDER_SITE_OTHER): Payer: Medicare Other | Admitting: Physician Assistant

## 2013-09-20 VITALS — BP 114/91 | HR 84 | Temp 98.0°F | Resp 16 | Ht 59.0 in | Wt 117.0 lb

## 2013-09-20 DIAGNOSIS — J811 Chronic pulmonary edema: Secondary | ICD-10-CM | POA: Insufficient documentation

## 2013-09-20 DIAGNOSIS — I509 Heart failure, unspecified: Secondary | ICD-10-CM

## 2013-09-20 DIAGNOSIS — J40 Bronchitis, not specified as acute or chronic: Secondary | ICD-10-CM | POA: Insufficient documentation

## 2013-09-20 DIAGNOSIS — G4700 Insomnia, unspecified: Secondary | ICD-10-CM

## 2013-09-20 LAB — CBC WITH DIFFERENTIAL/PLATELET
Basophils Absolute: 0 10*3/uL (ref 0.0–0.1)
Basophils Relative: 0 % (ref 0–1)
Eosinophils Relative: 2 % (ref 0–5)
HCT: 39.9 % (ref 36.0–46.0)
Hemoglobin: 13.3 g/dL (ref 12.0–15.0)
Lymphocytes Relative: 13 % (ref 12–46)
MCHC: 33.3 g/dL (ref 30.0–36.0)
MCV: 91.5 fL (ref 78.0–100.0)
Monocytes Absolute: 1 10*3/uL (ref 0.1–1.0)
Monocytes Relative: 11 % (ref 3–12)
Neutrophils Relative %: 74 % (ref 43–77)
RDW: 15.4 % (ref 11.5–15.5)

## 2013-09-20 LAB — BASIC METABOLIC PANEL
BUN: 22 mg/dL (ref 6–23)
Chloride: 103 mEq/L (ref 96–112)
Creat: 0.86 mg/dL (ref 0.50–1.10)
Glucose, Bld: 207 mg/dL — ABNORMAL HIGH (ref 70–99)
Potassium: 4.4 mEq/L (ref 3.5–5.3)

## 2013-09-20 NOTE — Progress Notes (Signed)
Pre visit review using our clinic review tool, if applicable. No additional management support is needed unless otherwise documented below in the visit note/SLS  

## 2013-09-20 NOTE — Patient Instructions (Addendum)
Take Lasix daily over the next week. Return to lab in 1 week so we can recheck your kidney function. Please use Symbicort twice a day as prescribed. I will call you with your x-ray results.  We will start an additional antibiotic if indicated.

## 2013-09-22 ENCOUNTER — Other Ambulatory Visit (HOSPITAL_COMMUNITY): Payer: Medicare Other

## 2013-09-24 DIAGNOSIS — G4700 Insomnia, unspecified: Secondary | ICD-10-CM | POA: Insufficient documentation

## 2013-09-24 DIAGNOSIS — I509 Heart failure, unspecified: Secondary | ICD-10-CM | POA: Insufficient documentation

## 2013-09-24 NOTE — Progress Notes (Signed)
Patient ID: Briana Ray, female   DOB: Aug 02, 1924, 79 y.o.   MRN: [id number]  Patient presents to clinic today for hospital followup. Patient was admitted to the hospital on 09/11/13 and diagnosed with an upper respiratory infection, COPD exacerbation and exacerbation of her CHF.  Patient noted to have elevated troponin secondary to demand ischemia.  EKG within normal limits.  CXR revealed exacerbation of COPD and CHF.  BNP in ~ 4000. Echocardiogram performed revealing EF of 40-45% and Septal hypertrophy.  Also mitral and tricuspid valves with mild regurgitation.  Patient sent home with Rx lasix and Levofloxacin.  Since discharge, patient states that her symptoms are improving. Has finished course of Levaquin.  Did not take prednisone and refuses to do so. She still has an occasional cough that is productive of clear sputum. Patient does endorse orthopnea. Denies PND.  States she's having to sleep on several pillows. Denies shortness of breath at rest. Does endorse increase in shortness of breath with mild activity. Denies chest pain, palpitations, lightheadedness or dizziness. Patient is prescribed Lasix 20 mg to take if she gains 2 pounds in a 24-hour period. Patient has not taken Lasix. Patient also forgetting to take her Symbicort as prescribed. O2 sats 90% on RA.  Patient with home oxygen.  Daughter is present for examination. Daughter notes that her mother will be moving with her to [place] within the next 2 weeks. She also notes her mother seems to have some difficulty with insomnia. Discussed with both the patient and her daughter that the patient seems to have some mild dementia, that I think could be contributing to her difficulty with sleep.  Daughter also states that patient has been having arguments with her son and that this also is contributing to her sleep disturbance. Patient endorses difficulty falling asleep. Daughter has been giving patient melatonin with some improvement of  symptoms.  Past Medical History  Diagnosis Date  . Coronary heart disease   . CVA (cerebral infarction) 04/25/10    MRI L parietal lobe infarction   . Hyperlipidemia   . Diabetes mellitus, type 2   . GERD (gastroesophageal reflux disease)   . 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 Rheumatoid arthritis(714.0)     on Arava. Dx for 20 yrs  . Aneurysm     "on my heart; dr said I didn't have to worry about it, it's not big enough yet"  . Hypertension   . Myocardial infarction 1998; 2005  . Pneumonia 11/16/11    "first time ever"  . Anemia   . Blood transfusion   . Stroke   . Diverticulitis 2008  . Debility 07/23/2013    Current Outpatient Prescriptions on File Prior to Visit  Medication Sig Dispense Refill  . aspirin EC 81 MG tablet Take 1 tablet (81 mg total) by mouth daily.  90 tablet  3  . atorvastatin (LIPITOR) 20 MG tablet Take 1 tablet (20 mg total) by mouth daily.  30 tablet  2  . budesonide-formoterol (SYMBICORT) 160-4.5 MCG/ACT inhaler Inhale 2 puffs into the lungs 2 (two) times daily.      . clopidogrel (PLAVIX) 75 MG tablet Take 1 tablet (75 mg total) by mouth daily with breakfast.  30 tablet  5  . furosemide (LASIX) 20 MG tablet Take 1 tablet (20 mg total) by mouth daily as needed (Take daily as needed if you gain more than 2 pounds in one day.).  30 tablet  2  . glipiZIDE (GLUCOTROL XL) 2.5 MG 24 hr tablet Take 1 tablet (2.5 mg total) by mouth daily.  30 tablet  5  . hydroxychloroquine (PLAQUENIL) 200 MG tablet Take 300 mg by mouth daily.      Marland Kitchen leflunomide (ARAVA) 20 MG tablet Take 10 mg by mouth daily.       Marland Kitchen levothyroxine (SYNTHROID, LEVOTHROID) 50 MCG tablet Take 1 tablet (50 mcg total) by mouth daily before breakfast.  30 tablet  4  . loperamide (IMODIUM) 2 MG capsule Take 2 mg by mouth as needed for diarrhea or loose stools.      . metoprolol tartrate (LOPRESSOR) 25 MG tablet Take 0.5 tablets (12.5 mg total) by mouth 2 (two)  times daily.  180 tablet  3  . Multiple Vitamins-Minerals (CENTRUM SILVER PO) Take 1 tablet by mouth daily.       . nitroGLYCERIN (NITROSTAT) 0.4 MG SL tablet Place 1 tablet (0.4 mg total) under the tongue every 5 (five) minutes as needed. For chest pain  25 tablet  6  . Omega-3 Fatty Acids (FISH OIL) 1200 MG CAPS Take 1 capsule by mouth daily.       . OXYGEN-HELIUM IN Inhale 2.5 L/hr into the lungs daily.        No current facility-administered medications on file prior to visit.    Allergies  Allergen Reactions  . Amoxicillin-Pot Clavulanate     REACTION: diarrhea    Family History  Problem Relation Age of Onset  . Arthritis Mother   . Other Brother     lung issues- oxygen  . Pneumonia Brother   . Diabetes Sister   . Hyperlipidemia Sister   . Hypertension Sister   . Stroke Sister   . Kidney disease Father   . Fibromyalgia Daughter   . Cancer Son     colon  . Cancer Sister     colon?  . Dementia Sister   . Alcohol abuse Brother   . Aneurysm Brother   . Heart disease Brother     History   Social History  . Marital Status: Widowed    Spouse Name: N/A    Number of Children: N/A  . Years of Education: N/A   Occupational History  . Retired     [employer]   Social History Main Topics  . Smoking status: Never Smoker   . Smokeless tobacco: Never Used  . Alcohol Use: No     Comment: rare  . Drug Use: No  . Sexual Activity: Not Currently   Other Topics Concern  . None   Social History Narrative  . None   Review of Systems - See HPI.  All other ROS are negative.  Filed Vitals:   09/20/13 0950  BP: 114/91  Pulse: 84  Temp: 98 F (36.7 C)  Resp: 16   Physical Exam  Vitals reviewed. Constitutional: She is oriented to person, place, and time and well-developed, well-nourished, and in no distress.  HENT:  Head: Normocephalic and atraumatic.  Right Ear: External ear normal.  Left Ear: External ear normal.  Nose: Nose normal.  Mouth/Throat:  Oropharynx is clear and moist. No oropharyngeal exudate.  Tympanic membranes within normal limits bilaterally.  Eyes: Conjunctivae and EOM are normal. Pupils are equal, round, and reactive to light.  Neck: Neck supple. No JVD present.  Cardiovascular: Normal rate, regular rhythm, normal heart sounds and intact distal pulses.   Pulmonary/Chest: Effort normal. No respiratory distress. She has no wheezes. She  has rales. She exhibits no tenderness.  Lymphadenopathy:    She has no cervical adenopathy.  Neurological: She is alert and oriented to person, place, and time. No cranial nerve deficit. GCS score is 15.  Skin: Skin is warm and dry. No rash noted.  Psychiatric: Mood, memory, affect and judgment normal.   Recent Results (from the past 2160 hour(s))  CBC WITH DIFFERENTIAL     Status: None   Collection Time    07/07/13  3:53 PM      Result Value Range   WBC 9.6  4.0 - 10.5 K/uL   RBC 4.49  3.87 - 5.11 MIL/uL   Hemoglobin 13.4  12.0 - 15.0 g/dL   HCT 16.1  09.6 - 04.5 %   MCV 90.6  78.0 - 100.0 fL   MCH 29.8  26.0 - 34.0 pg   MCHC 32.9  30.0 - 36.0 g/dL   RDW 40.9  81.1 - 91.4 %   Platelets 270  150 - 400 K/uL   Neutrophils Relative % 70  43 - 77 %   Neutro Abs 6.6  1.7 - 7.7 K/uL   Lymphocytes Relative 19  12 - 46 %   Lymphs Abs 1.9  0.7 - 4.0 K/uL   Monocytes Relative 9  3 - 12 %   Monocytes Absolute 0.9  0.1 - 1.0 K/uL   Eosinophils Relative 2  0 - 5 %   Eosinophils Absolute 0.2  0.0 - 0.7 K/uL   Basophils Relative 0  0 - 1 %   Basophils Absolute 0.0  0.0 - 0.1 K/uL   Smear Review Criteria for review not met    BASIC METABOLIC PANEL     Status: None   Collection Time    07/07/13  3:53 PM      Result Value Range   Sodium 139  135 - 145 mEq/L   Potassium 4.6  3.5 - 5.3 mEq/L   Chloride 104  96 - 112 mEq/L   CO2 29  19 - 32 mEq/L   Glucose, Bld 96  70 - 99 mg/dL   BUN 19  6 - 23 mg/dL   Creat 7.82  9.56 - 2.13 mg/dL   Calcium 9.3  8.4 - 08.6 mg/dL  URINE CULTURE      Status: None   Collection Time    07/21/13  2:56 PM      Result Value Range   Colony Count 50,000 COLONIES/ML     Organism ID, Bacteria Multiple bacterial morphotypes present, none     Organism ID, Bacteria predominant. Suggest appropriate recollection if      Organism ID, Bacteria clinically indicated.    URINALYSIS     Status: Abnormal   Collection Time    07/21/13  2:58 PM      Result Value Range   Color, Urine YELLOW  YELLOW   APPearance TURBID (*) CLEAR   Specific Gravity, Urine 1.018  1.005 - 1.030   pH 5.5  5.0 - 8.0   Glucose, UA NEG  NEG mg/dL   Bilirubin Urine NEG  NEG   Ketones, ur NEG  NEG mg/dL   Hgb urine dipstick NEG  NEG   Protein, ur NEG  NEG mg/dL   Urobilinogen, UA 0.2  0.0 - 1.0 mg/dL   Nitrite NEG  NEG   Leukocytes, UA NEG  NEG  CULTURE, URINE COMPREHENSIVE     Status: None   Collection Time    07/25/13  2:12 PM  Result Value Range   Colony Count 50,000 COLONIES/ML     Organism ID, Bacteria Multiple bacterial morphotypes present, none     Organism ID, Bacteria predominant. Suggest appropriate recollection if      Organism ID, Bacteria clinically indicated.    URINALYSIS     Status: None   Collection Time    07/25/13  2:13 PM      Result Value Range   Color, Urine YELLOW  YELLOW   APPearance CLEAR  CLEAR   Specific Gravity, Urine 1.017  1.005 - 1.030   pH 5.5  5.0 - 8.0   Glucose, UA NEG  NEG mg/dL   Bilirubin Urine NEG  NEG   Ketones, ur NEG  NEG mg/dL   Hgb urine dipstick NEG  NEG   Protein, ur NEG  NEG mg/dL   Urobilinogen, UA 0.2  0.0 - 1.0 mg/dL   Nitrite NEG  NEG   Leukocytes, UA NEG  NEG  GLUCOSE, POCT (MANUAL RESULT ENTRY)     Status: Normal   Collection Time    08/28/13  4:28 PM      Result Value Range   POC Glucose 136 (*) 70 - 99 mg/dl   Comment: Non-fasting  CBC WITH DIFFERENTIAL     Status: Abnormal   Collection Time    08/28/13  5:14 PM      Result Value Range   WBC 8.0  4.0 - 10.5 K/uL   RBC 4.17  3.87 - 5.11 MIL/uL    Hemoglobin 12.8  12.0 - 15.0 g/dL   HCT 60.4  54.0 - 98.1 %   MCV 92.3  78.0 - 100.0 fL   MCH 30.7  26.0 - 34.0 pg   MCHC 33.2  30.0 - 36.0 g/dL   RDW 19.1  47.8 - 29.5 %   Platelets 213  150 - 400 K/uL   Neutrophils Relative % 64  43 - 77 %   Neutro Abs 5.2  1.7 - 7.7 K/uL   Lymphocytes Relative 20  12 - 46 %   Lymphs Abs 1.6  0.7 - 4.0 K/uL   Monocytes Relative 13 (*) 3 - 12 %   Monocytes Absolute 1.0  0.1 - 1.0 K/uL   Eosinophils Relative 3  0 - 5 %   Eosinophils Absolute 0.2  0.0 - 0.7 K/uL   Basophils Relative 0  0 - 1 %   Basophils Absolute 0.0  0.0 - 0.1 K/uL   Smear Review Criteria for review not met    BASIC METABOLIC PANEL     Status: None   Collection Time    08/28/13  5:14 PM      Result Value Range   Sodium 141  135 - 145 mEq/L   Potassium 4.7  3.5 - 5.3 mEq/L   Chloride 106  96 - 112 mEq/L   CO2 27  19 - 32 mEq/L   Glucose, Bld 95  70 - 99 mg/dL   BUN 18  6 - 23 mg/dL   Creat 6.21  3.08 - 6.57 mg/dL   Calcium 9.4  8.4 - 84.6 mg/dL  CBC WITH DIFFERENTIAL     Status: Abnormal   Collection Time    09/11/13  2:30 PM      Result Value Range   WBC 7.5  4.0 - 10.5 K/uL   RBC 4.00  3.87 - 5.11 MIL/uL   Hemoglobin 12.1  12.0 - 15.0 g/dL   HCT 96.2  95.2 - 84.1 %  MCV 93.3  78.0 - 100.0 fL   MCH 30.3  26.0 - 34.0 pg   MCHC 32.4  30.0 - 36.0 g/dL   RDW 16.1  09.6 - 04.5 %   Platelets 200  150 - 400 K/uL   Neutrophils Relative % 75  43 - 77 %   Neutro Abs 5.6  1.7 - 7.7 K/uL   Lymphocytes Relative 11 (*) 12 - 46 %   Lymphs Abs 0.8  0.7 - 4.0 K/uL   Monocytes Relative 14 (*) 3 - 12 %   Monocytes Absolute 1.0  0.1 - 1.0 K/uL   Eosinophils Relative 0  0 - 5 %   Eosinophils Absolute 0.0  0.0 - 0.7 K/uL   Basophils Relative 1  0 - 1 %   Basophils Absolute 0.0  0.0 - 0.1 K/uL  COMPREHENSIVE METABOLIC PANEL     Status: Abnormal   Collection Time    09/11/13  2:30 PM      Result Value Range   Sodium 137  135 - 145 mEq/L   Potassium 3.7  3.5 - 5.1 mEq/L   Chloride  101  96 - 112 mEq/L   CO2 21  19 - 32 mEq/L   Glucose, Bld 98  70 - 99 mg/dL   BUN 15  6 - 23 mg/dL   Creatinine, Ser 4.09  0.50 - 1.10 mg/dL   Calcium 9.2  8.4 - 81.1 mg/dL   Total Protein 7.1  6.0 - 8.3 g/dL   Albumin 3.3 (*) 3.5 - 5.2 g/dL   AST 38 (*) 0 - 37 U/L   ALT 18  0 - 35 U/L   Alkaline Phosphatase 86  39 - 117 U/L   Total Bilirubin 0.5  0.3 - 1.2 mg/dL   GFR calc non Af Amer 49 (*) >90 mL/min   GFR calc Af Amer 57 (*) >90 mL/min   Comment: (NOTE)     The eGFR has been calculated using the CKD EPI equation.     This calculation has not been validated in all clinical situations.     eGFR's persistently <90 mL/min signify possible Chronic Kidney     Disease.  TROPONIN I     Status: Abnormal   Collection Time    09/11/13  2:30 PM      Result Value Range   Troponin I 0.60 (*) <0.30 ng/mL   Comment:            Due to the release kinetics of cTnI,     a negative result within the first hours     of the onset of symptoms does not rule out     myocardial infarction with certainty.     If myocardial infarction is still suspected,     repeat the test at appropriate intervals.     CRITICAL RESULT CALLED TO, READ BACK BY AND VERIFIED WITH:     CALLED TO M.UPTON RN AT 1512 ON 09/11/13 BY S.ROY  PRO B NATRIURETIC PEPTIDE     Status: Abnormal   Collection Time    09/11/13  2:30 PM      Result Value Range   Pro B Natriuretic peptide (BNP) 4266.0 (*) 0 - 450 pg/mL  URINALYSIS, ROUTINE W REFLEX MICROSCOPIC     Status: None   Collection Time    09/11/13  4:17 PM      Result Value Range   Color, Urine YELLOW  YELLOW   APPearance CLEAR  CLEAR  Specific Gravity, Urine 1.012  1.005 - 1.030   pH 5.5  5.0 - 8.0   Glucose, UA NEGATIVE  NEGATIVE mg/dL   Hgb urine dipstick NEGATIVE  NEGATIVE   Bilirubin Urine NEGATIVE  NEGATIVE   Ketones, ur NEGATIVE  NEGATIVE mg/dL   Protein, ur NEGATIVE  NEGATIVE mg/dL   Urobilinogen, UA 0.2  0.0 - 1.0 mg/dL   Nitrite NEGATIVE  NEGATIVE    Leukocytes, UA NEGATIVE  NEGATIVE   Comment: MICROSCOPIC NOT DONE ON URINES WITH NEGATIVE PROTEIN, BLOOD, LEUKOCYTES, NITRITE, OR GLUCOSE <1000 mg/dL.  TSH     Status: None   Collection Time    09/11/13  7:40 PM      Result Value Range   TSH 0.506  0.350 - 4.500 uIU/mL   Comment: Performed at [facility]  TROPONIN I     Status: Abnormal   Collection Time    09/11/13  7:40 PM      Result Value Range   Troponin I 0.46 (*) <0.30 ng/mL   Comment:            Due to the release kinetics of cTnI,     a negative result within the first hours     of the onset of symptoms does not rule out     myocardial infarction with certainty.     If myocardial infarction is still suspected,     repeat the test at appropriate intervals.     REPEATED TO VERIFY     CRITICAL RESULT CALLED TO, READ BACK BY AND VERIFIED WITH:     R COLUMBRES,RN 09/11/13 2203 RHOLMES  LEGIONELLA ANTIGEN, URINE     Status: None   Collection Time    09/11/13 11:57 PM      Result Value Range   Specimen Description URINE, CLEAN CATCH     Special Requests NONE     Legionella Antigen, Urine       Value: Negative for Legionella pneumophilia serogroup 1     Performed at [facility]   Report Status 09/12/2013 FINAL    STREP PNEUMONIAE URINARY ANTIGEN     Status: None   Collection Time    09/11/13 11:58 PM      Result Value Range   Strep Pneumo Urinary Antigen NEGATIVE  NEGATIVE   Comment:            Infection due to S. pneumoniae     cannot be absolutely ruled out     since the antigen present     may be below the detection limit     of the test.  TROPONIN I     Status: Abnormal   Collection Time    09/12/13 12:38 AM      Result Value Range   Troponin I 0.34 (*) <0.30 ng/mL   Comment:            Due to the release kinetics of cTnI,     a negative result within the first hours     of the onset of symptoms does not rule out     myocardial infarction with certainty.     If myocardial infarction is  still suspected,     repeat the test at appropriate intervals.     CRITICAL VALUE NOTED.  VALUE IS CONSISTENT WITH PREVIOUSLY REPORTED AND CALLED VALUE.     Performed at [facility]  BASIC METABOLIC PANEL     Status: Abnormal   Collection Time  09/12/13 12:38 AM      Result Value Range   Sodium 136  135 - 145 mEq/L   Potassium 4.2  3.5 - 5.1 mEq/L   Chloride 98  96 - 112 mEq/L   CO2 23  19 - 32 mEq/L   Glucose, Bld 209 (*) 70 - 99 mg/dL   BUN 22  6 - 23 mg/dL   Creatinine, Ser 5.62  0.50 - 1.10 mg/dL   Calcium 9.4  8.4 - 13.0 mg/dL   GFR calc non Af Amer 49 (*) >90 mL/min   GFR calc Af Amer 57 (*) >90 mL/min   Comment: (NOTE)     The eGFR has been calculated using the CKD EPI equation.     This calculation has not been validated in all clinical situations.     eGFR's persistently <90 mL/min signify possible Chronic Kidney     Disease.     Performed at [facility]  GLUCOSE, CAPILLARY     Status: Abnormal   Collection Time    09/12/13  6:17 AM      Result Value Range   Glucose-Capillary 147 (*) 70 - 99 mg/dL  GLUCOSE, CAPILLARY     Status: Abnormal   Collection Time    09/12/13 11:08 AM      Result Value Range   Glucose-Capillary 182 (*) 70 - 99 mg/dL   Comment 1 Notify RN    GLUCOSE, CAPILLARY     Status: Abnormal   Collection Time    09/12/13  3:36 PM      Result Value Range   Glucose-Capillary 130 (*) 70 - 99 mg/dL   Comment 1 Notify RN    GLUCOSE, CAPILLARY     Status: Abnormal   Collection Time    09/12/13  9:18 PM      Result Value Range   Glucose-Capillary 178 (*) 70 - 99 mg/dL   Comment 1 Notify RN    GLUCOSE, CAPILLARY     Status: Abnormal   Collection Time    09/13/13  6:18 AM      Result Value Range   Glucose-Capillary 122 (*) 70 - 99 mg/dL  GLUCOSE, CAPILLARY     Status: Abnormal   Collection Time    09/13/13  3:48 PM      Result Value Range   Glucose-Capillary 168 (*) 70 - 99 mg/dL   Comment 1 Documented in  Chart     Comment 2 Notify RN    GLUCOSE, CAPILLARY     Status: Abnormal   Collection Time    09/13/13  9:37 PM      Result Value Range   Glucose-Capillary 124 (*) 70 - 99 mg/dL  GLUCOSE, CAPILLARY     Status: Abnormal   Collection Time    09/14/13  6:05 AM      Result Value Range   Glucose-Capillary 63 (*) 70 - 99 mg/dL  GLUCOSE, CAPILLARY     Status: None   Collection Time    09/14/13  6:51 AM      Result Value Range   Glucose-Capillary 95  70 - 99 mg/dL  GLUCOSE, CAPILLARY     Status: None   Collection Time    09/14/13 11:13 AM      Result Value Range   Glucose-Capillary 93  70 - 99 mg/dL   Comment 1 Notify RN    CBC WITH DIFFERENTIAL     Status: None   Collection Time  09/20/13 10:53 AM      Result Value Range   WBC 8.7  4.0 - 10.5 K/uL   RBC 4.36  3.87 - 5.11 MIL/uL   Hemoglobin 13.3  12.0 - 15.0 g/dL   HCT 40.9  81.1 - 91.4 %   MCV 91.5  78.0 - 100.0 fL   MCH 30.5  26.0 - 34.0 pg   MCHC 33.3  30.0 - 36.0 g/dL   RDW 78.2  95.6 - 21.3 %   Platelets 229  150 - 400 K/uL   Neutrophils Relative % 74  43 - 77 %   Neutro Abs 6.3  1.7 - 7.7 K/uL   Lymphocytes Relative 13  12 - 46 %   Lymphs Abs 1.2  0.7 - 4.0 K/uL   Monocytes Relative 11  3 - 12 %   Monocytes Absolute 1.0  0.1 - 1.0 K/uL   Eosinophils Relative 2  0 - 5 %   Eosinophils Absolute 0.2  0.0 - 0.7 K/uL   Basophils Relative 0  0 - 1 %   Basophils Absolute 0.0  0.0 - 0.1 K/uL   Smear Review Criteria for review not met    BASIC METABOLIC PANEL     Status: Abnormal   Collection Time    09/20/13 10:53 AM      Result Value Range   Sodium 135  135 - 145 mEq/L   Potassium 4.4  3.5 - 5.3 mEq/L   Chloride 103  96 - 112 mEq/L   CO2 21  19 - 32 mEq/L   Glucose, Bld 207 (*) 70 - 99 mg/dL   BUN 22  6 - 23 mg/dL   Creat 0.86  5.78 - 4.69 mg/dL   Calcium 8.7  8.4 - 62.9 mg/dL  PRO B NATRIURETIC PEPTIDE     Status: Abnormal   Collection Time    09/20/13 10:53 AM      Result Value Range   Pro B Natriuretic  peptide (BNP) 1423.00 (*) <451 pg/mL   CLINICAL DATA: Fever, shortness of breath  EXAM:  CHEST 2 VIEW  COMPARISON: 12/31/2011; 11/16/2011; 08/05/2011  FINDINGS:  Grossly unchanged borderline enlarged cardiac silhouette and  mediastinal contours post median sternotomy, CABG and valve  replacement. There is persistent tortuosity and possible slight  ectasia of the thoracic aorta with associated atherosclerotic  calcifications.  Lung volumes are slightly reduced. Pulmonary vasculature is less  distinct of the present examination with cephalization of flow.  Worsening bibasilar slightly coarsened interstitial opacities. No  definite pleural effusion or pneumothorax. Grossly unchanged bones.  IMPRESSION:  1. Findings most suggestive of pulmonary edema superimposed on  emphysematous change, though note, underlying atypical infection  and/or pulmonary fibrosis is not excluded on the basis of this  examination.  2. Worsening bibasilar atelectasis. No focal airspace opacities to  suggest pneumonia.  Assessment/Plan: CHF exacerbation Will recheck BNP.  Giving extensive crackles on lung exam will repeat CXR.  Repeat CBC and CMP.  Lasix QOD.  Symbicort as prescribed for COPD.  Continue other medications as prescribed.  Follow-up Next Monday for reassessment.  Insomnia Most likely multifactorial.  Continue Melatonin.  Discussed sleep hygiene practices with patient and patient's daughter.

## 2013-09-24 NOTE — Assessment & Plan Note (Signed)
Most likely multifactorial.  Continue Melatonin.  Discussed sleep hygiene practices with patient and patient's daughter.

## 2013-09-24 NOTE — Assessment & Plan Note (Signed)
Will recheck BNP.  Giving extensive crackles on lung exam will repeat CXR.  Repeat CBC and CMP.  Lasix QOD.  Symbicort as prescribed for COPD.  Continue other medications as prescribed.  Follow-up Next Monday for reassessment.

## 2013-09-25 ENCOUNTER — Encounter: Payer: Self-pay | Admitting: Physician Assistant

## 2013-09-25 ENCOUNTER — Telehealth: Payer: Self-pay | Admitting: Family Medicine

## 2013-09-25 ENCOUNTER — Ambulatory Visit (INDEPENDENT_AMBULATORY_CARE_PROVIDER_SITE_OTHER): Payer: Medicare Other | Admitting: Physician Assistant

## 2013-09-25 VITALS — BP 110/70 | HR 91 | Temp 97.8°F | Resp 16 | Ht 59.0 in | Wt 118.5 lb

## 2013-09-25 DIAGNOSIS — J209 Acute bronchitis, unspecified: Secondary | ICD-10-CM

## 2013-09-25 DIAGNOSIS — J441 Chronic obstructive pulmonary disease with (acute) exacerbation: Secondary | ICD-10-CM

## 2013-09-25 DIAGNOSIS — I509 Heart failure, unspecified: Secondary | ICD-10-CM

## 2013-09-25 LAB — CBC WITH DIFFERENTIAL/PLATELET
Basophils Absolute: 0.1 10*3/uL (ref 0.0–0.1)
Basophils Relative: 1 % (ref 0–1)
Eosinophils Absolute: 0.1 10*3/uL (ref 0.0–0.7)
Eosinophils Relative: 1 % (ref 0–5)
HCT: 38.7 % (ref 36.0–46.0)
Lymphocytes Relative: 12 % (ref 12–46)
Lymphs Abs: 1.3 10*3/uL (ref 0.7–4.0)
MCHC: 33.1 g/dL (ref 30.0–36.0)
MCV: 90.2 fL (ref 78.0–100.0)
Monocytes Absolute: 0.9 10*3/uL (ref 0.1–1.0)
Monocytes Relative: 8 % (ref 3–12)
Neutro Abs: 8.5 10*3/uL — ABNORMAL HIGH (ref 1.7–7.7)
Platelets: 261 10*3/uL (ref 150–400)
RBC: 4.29 MIL/uL (ref 3.87–5.11)
RDW: 15.4 % (ref 11.5–15.5)
WBC: 10.8 10*3/uL — ABNORMAL HIGH (ref 4.0–10.5)

## 2013-09-25 LAB — BASIC METABOLIC PANEL
BUN: 18 mg/dL (ref 6–23)
Calcium: 9.2 mg/dL (ref 8.4–10.5)
Chloride: 102 mEq/L (ref 96–112)
Creat: 0.87 mg/dL (ref 0.50–1.10)
Potassium: 4.7 mEq/L (ref 3.5–5.3)

## 2013-09-25 MED ORDER — IPRATROPIUM-ALBUTEROL 0.5-2.5 (3) MG/3ML IN SOLN
3.0000 mL | Freq: Once | RESPIRATORY_TRACT | Status: AC
  Administered 2013-09-25: 3 mL via RESPIRATORY_TRACT

## 2013-09-25 MED ORDER — CEFDINIR 300 MG PO CAPS: 300.0000 mg | ORAL_CAPSULE | Freq: Two times a day (BID) | ORAL | Status: AC

## 2013-09-25 NOTE — Telephone Encounter (Signed)
Faxed

## 2013-09-25 NOTE — Telephone Encounter (Signed)
The fax number is [phone] [facility]

## 2013-09-25 NOTE — Telephone Encounter (Signed)
OK to give order to portable O2 to home health, verbal is OK for now for PUlmonary fibrosis and hypoxia, at 2 L

## 2013-09-25 NOTE — Telephone Encounter (Signed)
She is working with [facility].  Daughter needs to get Oxygen for the patient as she leaves tomorrow to go live with her sister.  She will have to have the oxygen in place for her in [place].  They need an order.  Dr Delford Field says the order has to come from Dr B.

## 2013-09-25 NOTE — Patient Instructions (Signed)
Please continue medications as prescribed.  Delsym for cough.  Take antibiotic with food as prescribed.  Please follow-up with your new PCP in [place]

## 2013-09-25 NOTE — Progress Notes (Signed)
Pre visit review using our clinic review tool, if applicable. No additional management support is needed unless otherwise documented below in the visit note/SLS  

## 2013-09-26 NOTE — Telephone Encounter (Signed)
Will need to state directions for oxygen, how many litters? Continuous? Ph [phone]

## 2013-09-26 NOTE — Telephone Encounter (Signed)
Faxed to [facility]

## 2013-09-26 NOTE — Telephone Encounter (Signed)
Patient daughter called in on this stating that the company is [employer] and the fax # [phone]

## 2013-09-27 ENCOUNTER — Other Ambulatory Visit (HOSPITAL_COMMUNITY): Payer: Medicare Other

## 2013-09-27 ENCOUNTER — Telehealth: Payer: Self-pay | Admitting: *Deleted

## 2013-09-27 NOTE — Telephone Encounter (Signed)
Patient daughter Corrie Dandy called in stating that patient still does not have any oxygen. I talked to Malva Cogan, PA-C and he states that he does not know patients stats and #s and to tell patient daughter to take patient to nearest ED to get O2.

## 2013-09-27 NOTE — Telephone Encounter (Signed)
Patient's daughter wanted you to know that they took her Mother to EMR in [place] to get her put on oxygen. Daughter stated that er doctor wrote patient a script for oxygen.

## 2013-09-27 NOTE — Telephone Encounter (Signed)
[facility] still states the order is not correct, please contact them today [phone]

## 2013-09-29 ENCOUNTER — Ambulatory Visit: Payer: Medicare Other | Admitting: Neurology

## 2013-10-01 NOTE — Assessment & Plan Note (Signed)
Rx Cefdinir.  Repeat CBC and BMET.

## 2013-10-01 NOTE — Progress Notes (Signed)
Patient ID: Briana Ray, female   DOB: Jul 10, 1924, 80 y.o.   MRN: [id number]  Patient presents to clinic today for 4-day follow-up.  Patient was seen last week for Hospital follow-up of bronchitis, COPD exacerbation and CHF exacerbation.  Patient and daughter endorse taking medications as prescribed.  Are taking Lasix QOD.  Patient has noticed an improvement of shortness of breath.  Patient still with cough that daughter states has become productive of green sputum again.  Denies fever, chills, wheezing. Patient has been resting better at night.  Denies other concern at present.    Past Medical History  Diagnosis Date  . Coronary heart disease   . CVA (cerebral infarction) 04/25/10    MRI L parietal lobe infarction   . Hyperlipidemia   . Diabetes mellitus, type 2   . GERD (gastroesophageal reflux disease)   . 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 Rheumatoid arthritis(714.0)     on Arava. Dx for 20 yrs  . Aneurysm     "on my heart; dr said I didn't have to worry about it, it's not big enough yet"  . Hypertension   . Myocardial infarction 1998; 2005  . Pneumonia 11/16/11    "first time ever"  . Anemia   . Blood transfusion   . Stroke   . Diverticulitis 2008  . Debility 07/23/2013    Current Outpatient Prescriptions on File Prior to Visit  Medication Sig Dispense Refill  . aspirin EC 81 MG tablet Take 1 tablet (81 mg total) by mouth daily.  90 tablet  3  . atorvastatin (LIPITOR) 20 MG tablet Take 1 tablet (20 mg total) by mouth daily.  30 tablet  2  . budesonide-formoterol (SYMBICORT) 160-4.5 MCG/ACT inhaler Inhale 2 puffs into the lungs 2 (two) times daily.      . clopidogrel (PLAVIX) 75 MG tablet Take 1 tablet (75 mg total) by mouth daily with breakfast.  30 tablet  5  . furosemide (LASIX) 20 MG tablet Take 1 tablet (20 mg total) by mouth daily as needed (Take daily as needed if you gain more than 2 pounds in one day.).  30 tablet  2  .  glipiZIDE (GLUCOTROL XL) 2.5 MG 24 hr tablet Take 1 tablet (2.5 mg total) by mouth daily.  30 tablet  5  . hydroxychloroquine (PLAQUENIL) 200 MG tablet Take 300 mg by mouth daily.      Marland Kitchen leflunomide (ARAVA) 20 MG tablet Take 10 mg by mouth daily.       Marland Kitchen levothyroxine (SYNTHROID, LEVOTHROID) 50 MCG tablet Take 1 tablet (50 mcg total) by mouth daily before breakfast.  30 tablet  4  . loperamide (IMODIUM) 2 MG capsule Take 2 mg by mouth as needed for diarrhea or loose stools.      . metoprolol tartrate (LOPRESSOR) 25 MG tablet Take 0.5 tablets (12.5 mg total) by mouth 2 (two) times daily.  180 tablet  3  . Multiple Vitamins-Minerals (CENTRUM SILVER PO) Take 1 tablet by mouth daily.       . nitroGLYCERIN (NITROSTAT) 0.4 MG SL tablet Place 1 tablet (0.4 mg total) under the tongue every 5 (five) minutes as needed. For chest pain  25 tablet  6  . Omega-3 Fatty Acids (FISH OIL) 1200 MG CAPS Take 1 capsule by mouth daily.       . OXYGEN-HELIUM IN Inhale 2.5 L/hr into the lungs daily.  No current facility-administered medications on file prior to visit.    Allergies  Allergen Reactions  . Amoxicillin-Pot Clavulanate     REACTION: diarrhea    Family History  Problem Relation Age of Onset  . Arthritis Mother   . Other Brother     lung issues- oxygen  . Pneumonia Brother   . Diabetes Sister   . Hyperlipidemia Sister   . Hypertension Sister   . Stroke Sister   . Kidney disease Father   . Fibromyalgia Daughter   . Cancer Son     colon  . Cancer Sister     colon?  . Dementia Sister   . Alcohol abuse Brother   . Aneurysm Brother   . Heart disease Brother     History   Social History  . Marital Status: Widowed    Spouse Name: N/A    Number of Children: N/A  . Years of Education: N/A   Occupational History  . Retired     [employer]   Social History Main Topics  . Smoking status: Never Smoker   . Smokeless tobacco: Never Used  . Alcohol Use: No     Comment: rare  .  Drug Use: No  . Sexual Activity: Not Currently   Other Topics Concern  . None   Social History Narrative  . None   Review of Systems - See HPI.  All other ROS are negative.  Filed Vitals:   09/25/13 1022  BP: 110/70  Pulse: 91  Temp: 97.8 F (36.6 C)  Resp: 16   Physical Exam  Vitals reviewed. Constitutional: She is oriented to person, place, and time and well-developed, well-nourished, and in no distress.  HENT:  Head: Normocephalic and atraumatic.  Right Ear: External ear normal.  Left Ear: External ear normal.  Mouth/Throat: Oropharynx is clear and moist.  Eyes: Conjunctivae are normal. Pupils are equal, round, and reactive to light.  Neck: Neck supple.  Cardiovascular: Normal rate, regular rhythm, normal heart sounds and intact distal pulses.   No murmur heard. Pulmonary/Chest: Effort normal and breath sounds normal. No respiratory distress. She has no wheezes. She has no rales. She exhibits no tenderness.  Abdominal: Bowel sounds are normal.  Lymphadenopathy:    She has no cervical adenopathy.  Neurological: She is alert and oriented to person, place, and time.  Skin: Skin is warm and dry. No rash noted.  Psychiatric: Affect normal.    Recent Results (from the past 2160 hour(s))  CBC WITH DIFFERENTIAL     Status: None   Collection Time    07/07/13  3:53 PM      Result Value Range   WBC 9.6  4.0 - 10.5 K/uL   RBC 4.49  3.87 - 5.11 MIL/uL   Hemoglobin 13.4  12.0 - 15.0 g/dL   HCT 40.7  36.0 - 46.0 %   MCV 90.6  78.0 - 100.0 fL   MCH 29.8  26.0 - 34.0 pg   MCHC 32.9  30.0 - 36.0 g/dL   RDW 15.5  11.5 - 15.5 %   Platelets 270  150 - 400 K/uL   Neutrophils Relative % 70  43 - 77 %   Neutro Abs 6.6  1.7 - 7.7 K/uL   Lymphocytes Relative 19  12 - 46 %   Lymphs Abs 1.9  0.7 - 4.0 K/uL   Monocytes Relative 9  3 - 12 %   Monocytes Absolute 0.9  0.1 - 1.0 K/uL   Eosinophils  Relative 2  0 - 5 %   Eosinophils Absolute 0.2  0.0 - 0.7 K/uL   Basophils Relative 0  0  - 1 %   Basophils Absolute 0.0  0.0 - 0.1 K/uL   Smear Review Criteria for review not met    BASIC METABOLIC PANEL     Status: None   Collection Time    07/07/13  3:53 PM      Result Value Range   Sodium 139  135 - 145 mEq/L   Potassium 4.6  3.5 - 5.3 mEq/L   Chloride 104  96 - 112 mEq/L   CO2 29  19 - 32 mEq/L   Glucose, Bld 96  70 - 99 mg/dL   BUN 19  6 - 23 mg/dL   Creat 0.92  0.50 - 1.10 mg/dL   Calcium 9.3  8.4 - 10.5 mg/dL  URINE CULTURE     Status: None   Collection Time    07/21/13  2:56 PM      Result Value Range   Colony Count 50,000 COLONIES/ML     Organism ID, Bacteria Multiple bacterial morphotypes present, none     Organism ID, Bacteria predominant. Suggest appropriate recollection if      Organism ID, Bacteria clinically indicated.    URINALYSIS     Status: Abnormal   Collection Time    07/21/13  2:58 PM      Result Value Range   Color, Urine YELLOW  YELLOW   APPearance TURBID (*) CLEAR   Specific Gravity, Urine 1.018  1.005 - 1.030   pH 5.5  5.0 - 8.0   Glucose, UA NEG  NEG mg/dL   Bilirubin Urine NEG  NEG   Ketones, ur NEG  NEG mg/dL   Hgb urine dipstick NEG  NEG   Protein, ur NEG  NEG mg/dL   Urobilinogen, UA 0.2  0.0 - 1.0 mg/dL   Nitrite NEG  NEG   Leukocytes, UA NEG  NEG  CULTURE, URINE COMPREHENSIVE     Status: None   Collection Time    07/25/13  2:12 PM      Result Value Range   Colony Count 50,000 COLONIES/ML     Organism ID, Bacteria Multiple bacterial morphotypes present, none     Organism ID, Bacteria predominant. Suggest appropriate recollection if      Organism ID, Bacteria clinically indicated.    URINALYSIS     Status: None   Collection Time    07/25/13  2:13 PM      Result Value Range   Color, Urine YELLOW  YELLOW   APPearance CLEAR  CLEAR   Specific Gravity, Urine 1.017  1.005 - 1.030   pH 5.5  5.0 - 8.0   Glucose, UA NEG  NEG mg/dL   Bilirubin Urine NEG  NEG   Ketones, ur NEG  NEG mg/dL   Hgb urine dipstick NEG  NEG    Protein, ur NEG  NEG mg/dL   Urobilinogen, UA 0.2  0.0 - 1.0 mg/dL   Nitrite NEG  NEG   Leukocytes, UA NEG  NEG  GLUCOSE, POCT (MANUAL RESULT ENTRY)     Status: Normal   Collection Time    08/28/13  4:28 PM      Result Value Range   POC Glucose 136 (*) 70 - 99 mg/dl   Comment: Non-fasting  CBC WITH DIFFERENTIAL     Status: Abnormal   Collection Time    08/28/13  5:14 PM  Result Value Range   WBC 8.0  4.0 - 10.5 K/uL   RBC 4.17  3.87 - 5.11 MIL/uL   Hemoglobin 12.8  12.0 - 15.0 g/dL   HCT 38.5  36.0 - 46.0 %   MCV 92.3  78.0 - 100.0 fL   MCH 30.7  26.0 - 34.0 pg   MCHC 33.2  30.0 - 36.0 g/dL   RDW 14.9  11.5 - 15.5 %   Platelets 213  150 - 400 K/uL   Neutrophils Relative % 64  43 - 77 %   Neutro Abs 5.2  1.7 - 7.7 K/uL   Lymphocytes Relative 20  12 - 46 %   Lymphs Abs 1.6  0.7 - 4.0 K/uL   Monocytes Relative 13 (*) 3 - 12 %   Monocytes Absolute 1.0  0.1 - 1.0 K/uL   Eosinophils Relative 3  0 - 5 %   Eosinophils Absolute 0.2  0.0 - 0.7 K/uL   Basophils Relative 0  0 - 1 %   Basophils Absolute 0.0  0.0 - 0.1 K/uL   Smear Review Criteria for review not met    BASIC METABOLIC PANEL     Status: None   Collection Time    08/28/13  5:14 PM      Result Value Range   Sodium 141  135 - 145 mEq/L   Potassium 4.7  3.5 - 5.3 mEq/L   Chloride 106  96 - 112 mEq/L   CO2 27  19 - 32 mEq/L   Glucose, Bld 95  70 - 99 mg/dL   BUN 18  6 - 23 mg/dL   Creat 0.98  0.50 - 1.10 mg/dL   Calcium 9.4  8.4 - 10.5 mg/dL  CBC WITH DIFFERENTIAL     Status: Abnormal   Collection Time    09/11/13  2:30 PM      Result Value Range   WBC 7.5  4.0 - 10.5 K/uL   RBC 4.00  3.87 - 5.11 MIL/uL   Hemoglobin 12.1  12.0 - 15.0 g/dL   HCT 37.3  36.0 - 46.0 %   MCV 93.3  78.0 - 100.0 fL   MCH 30.3  26.0 - 34.0 pg   MCHC 32.4  30.0 - 36.0 g/dL   RDW 15.3  11.5 - 15.5 %   Platelets 200  150 - 400 K/uL   Neutrophils Relative % 75  43 - 77 %   Neutro Abs 5.6  1.7 - 7.7 K/uL   Lymphocytes Relative 11 (*)  12 - 46 %   Lymphs Abs 0.8  0.7 - 4.0 K/uL   Monocytes Relative 14 (*) 3 - 12 %   Monocytes Absolute 1.0  0.1 - 1.0 K/uL   Eosinophils Relative 0  0 - 5 %   Eosinophils Absolute 0.0  0.0 - 0.7 K/uL   Basophils Relative 1  0 - 1 %   Basophils Absolute 0.0  0.0 - 0.1 K/uL  COMPREHENSIVE METABOLIC PANEL     Status: Abnormal   Collection Time    09/11/13  2:30 PM      Result Value Range   Sodium 137  135 - 145 mEq/L   Potassium 3.7  3.5 - 5.1 mEq/L   Chloride 101  96 - 112 mEq/L   CO2 21  19 - 32 mEq/L   Glucose, Bld 98  70 - 99 mg/dL   BUN 15  6 - 23 mg/dL   Creatinine, Ser 1.00  0.50 - 1.10 mg/dL   Calcium 9.2  8.4 - 10.5 mg/dL   Total Protein 7.1  6.0 - 8.3 g/dL   Albumin 3.3 (*) 3.5 - 5.2 g/dL   AST 38 (*) 0 - 37 U/L   ALT 18  0 - 35 U/L   Alkaline Phosphatase 86  39 - 117 U/L   Total Bilirubin 0.5  0.3 - 1.2 mg/dL   GFR calc non Af Amer 49 (*) >90 mL/min   GFR calc Af Amer 57 (*) >90 mL/min   Comment: (NOTE)     The eGFR has been calculated using the CKD EPI equation.     This calculation has not been validated in all clinical situations.     eGFR's persistently <90 mL/min signify possible Chronic Kidney     Disease.  TROPONIN I     Status: Abnormal   Collection Time    09/11/13  2:30 PM      Result Value Range   Troponin I 0.60 (*) <0.30 ng/mL   Comment:            Due to the release kinetics of cTnI,     a negative result within the first hours     of the onset of symptoms does not rule out     myocardial infarction with certainty.     If myocardial infarction is still suspected,     repeat the test at appropriate intervals.     CRITICAL RESULT CALLED TO, READ BACK BY AND VERIFIED WITH:     CALLED TO M.UPTON RN AT 1512 ON 09/11/13 BY S.ROY  PRO B NATRIURETIC PEPTIDE     Status: Abnormal   Collection Time    09/11/13  2:30 PM      Result Value Range   Pro B Natriuretic peptide (BNP) 4266.0 (*) 0 - 450 pg/mL  URINALYSIS, ROUTINE W REFLEX MICROSCOPIC     Status:  None   Collection Time    09/11/13  4:17 PM      Result Value Range   Color, Urine YELLOW  YELLOW   APPearance CLEAR  CLEAR   Specific Gravity, Urine 1.012  1.005 - 1.030   pH 5.5  5.0 - 8.0   Glucose, UA NEGATIVE  NEGATIVE mg/dL   Hgb urine dipstick NEGATIVE  NEGATIVE   Bilirubin Urine NEGATIVE  NEGATIVE   Ketones, ur NEGATIVE  NEGATIVE mg/dL   Protein, ur NEGATIVE  NEGATIVE mg/dL   Urobilinogen, UA 0.2  0.0 - 1.0 mg/dL   Nitrite NEGATIVE  NEGATIVE   Leukocytes, UA NEGATIVE  NEGATIVE   Comment: MICROSCOPIC NOT DONE ON URINES WITH NEGATIVE PROTEIN, BLOOD, LEUKOCYTES, NITRITE, OR GLUCOSE <1000 mg/dL.  TSH     Status: None   Collection Time    09/11/13  7:40 PM      Result Value Range   TSH 0.506  0.350 - 4.500 uIU/mL   Comment: Performed at [facility]  TROPONIN I     Status: Abnormal   Collection Time    09/11/13  7:40 PM      Result Value Range   Troponin I 0.46 (*) <0.30 ng/mL   Comment:            Due to the release kinetics of cTnI,     a negative result within the first hours     of the onset of symptoms does not rule out     myocardial infarction with certainty.  If myocardial infarction is still suspected,     repeat the test at appropriate intervals.     REPEATED TO VERIFY     CRITICAL RESULT CALLED TO, READ BACK BY AND VERIFIED WITH:     R COLUMBRES,RN 09/11/13 2203 RHOLMES  LEGIONELLA ANTIGEN, URINE     Status: None   Collection Time    09/11/13 11:57 PM      Result Value Range   Specimen Description URINE, CLEAN CATCH     Special Requests NONE     Legionella Antigen, Urine       Value: Negative for Legionella pneumophilia serogroup 1     Performed at [facility]   Report Status 09/12/2013 FINAL    STREP PNEUMONIAE URINARY ANTIGEN     Status: None   Collection Time    09/11/13 11:58 PM      Result Value Range   Strep Pneumo Urinary Antigen NEGATIVE  NEGATIVE   Comment:            Infection due to S. pneumoniae     cannot be  absolutely ruled out     since the antigen present     may be below the detection limit     of the test.  TROPONIN I     Status: Abnormal   Collection Time    09/12/13 12:38 AM      Result Value Range   Troponin I 0.34 (*) <0.30 ng/mL   Comment:            Due to the release kinetics of cTnI,     a negative result within the first hours     of the onset of symptoms does not rule out     myocardial infarction with certainty.     If myocardial infarction is still suspected,     repeat the test at appropriate intervals.     CRITICAL VALUE NOTED.  VALUE IS CONSISTENT WITH PREVIOUSLY REPORTED AND CALLED VALUE.     Performed at [place] [facility] METABOLIC PANEL     Status: Abnormal   Collection Time    09/12/13 12:38 AM      Result Value Range   Sodium 136  135 - 145 mEq/L   Potassium 4.2  3.5 - 5.1 mEq/L   Chloride 98  96 - 112 mEq/L   CO2 23  19 - 32 mEq/L   Glucose, Bld 209 (*) 70 - 99 mg/dL   BUN 22  6 - 23 mg/dL   Creatinine, Ser 1.00  0.50 - 1.10 mg/dL   Calcium 9.4  8.4 - 10.5 mg/dL   GFR calc non Af Amer 49 (*) >90 mL/min   GFR calc Af Amer 57 (*) >90 mL/min   Comment: (NOTE)     The eGFR has been calculated using the CKD EPI equation.     This calculation has not been validated in all clinical situations.     eGFR's persistently <90 mL/min signify possible Chronic Kidney     Disease.     Performed at [facility]  GLUCOSE, CAPILLARY     Status: Abnormal   Collection Time    09/12/13  6:17 AM      Result Value Range   Glucose-Capillary 147 (*) 70 - 99 mg/dL  GLUCOSE, CAPILLARY     Status: Abnormal   Collection Time    09/12/13 11:08 AM      Result Value Range  Glucose-Capillary 182 (*) 70 - 99 mg/dL   Comment 1 Notify RN    GLUCOSE, CAPILLARY     Status: Abnormal   Collection Time    09/12/13  3:36 PM      Result Value Range   Glucose-Capillary 130 (*) 70 - 99 mg/dL   Comment 1 Notify RN    GLUCOSE, CAPILLARY     Status:  Abnormal   Collection Time    09/12/13  9:18 PM      Result Value Range   Glucose-Capillary 178 (*) 70 - 99 mg/dL   Comment 1 Notify RN    GLUCOSE, CAPILLARY     Status: Abnormal   Collection Time    09/13/13  6:18 AM      Result Value Range   Glucose-Capillary 122 (*) 70 - 99 mg/dL  GLUCOSE, CAPILLARY     Status: Abnormal   Collection Time    09/13/13  3:48 PM      Result Value Range   Glucose-Capillary 168 (*) 70 - 99 mg/dL   Comment 1 Documented in Chart     Comment 2 Notify RN    GLUCOSE, CAPILLARY     Status: Abnormal   Collection Time    09/13/13  9:37 PM      Result Value Range   Glucose-Capillary 124 (*) 70 - 99 mg/dL  GLUCOSE, CAPILLARY     Status: Abnormal   Collection Time    09/14/13  6:05 AM      Result Value Range   Glucose-Capillary 63 (*) 70 - 99 mg/dL  GLUCOSE, CAPILLARY     Status: None   Collection Time    09/14/13  6:51 AM      Result Value Range   Glucose-Capillary 95  70 - 99 mg/dL  GLUCOSE, CAPILLARY     Status: None   Collection Time    09/14/13 11:13 AM      Result Value Range   Glucose-Capillary 93  70 - 99 mg/dL   Comment 1 Notify RN    CBC WITH DIFFERENTIAL     Status: None   Collection Time    09/20/13 10:53 AM      Result Value Range   WBC 8.7  4.0 - 10.5 K/uL   RBC 4.36  3.87 - 5.11 MIL/uL   Hemoglobin 13.3  12.0 - 15.0 g/dL   HCT 39.9  36.0 - 46.0 %   MCV 91.5  78.0 - 100.0 fL   MCH 30.5  26.0 - 34.0 pg   MCHC 33.3  30.0 - 36.0 g/dL   RDW 15.4  11.5 - 15.5 %   Platelets 229  150 - 400 K/uL   Neutrophils Relative % 74  43 - 77 %   Neutro Abs 6.3  1.7 - 7.7 K/uL   Lymphocytes Relative 13  12 - 46 %   Lymphs Abs 1.2  0.7 - 4.0 K/uL   Monocytes Relative 11  3 - 12 %   Monocytes Absolute 1.0  0.1 - 1.0 K/uL   Eosinophils Relative 2  0 - 5 %   Eosinophils Absolute 0.2  0.0 - 0.7 K/uL   Basophils Relative 0  0 - 1 %   Basophils Absolute 0.0  0.0 - 0.1 K/uL   Smear Review Criteria for review not met    BASIC METABOLIC PANEL      Status: Abnormal   Collection Time    09/20/13 10:53 AM      Result  Value Range   Sodium 135  135 - 145 mEq/L   Potassium 4.4  3.5 - 5.3 mEq/L   Chloride 103  96 - 112 mEq/L   CO2 21  19 - 32 mEq/L   Glucose, Bld 207 (*) 70 - 99 mg/dL   BUN 22  6 - 23 mg/dL   Creat 0.86  0.50 - 1.10 mg/dL   Calcium 8.7  8.4 - 10.5 mg/dL  PRO B NATRIURETIC PEPTIDE     Status: Abnormal   Collection Time    09/20/13 10:53 AM      Result Value Range   Pro B Natriuretic peptide (BNP) 1423.00 (*) <451 pg/mL  CBC WITH DIFFERENTIAL     Status: Abnormal   Collection Time    09/25/13 11:59 AM      Result Value Range   WBC 10.8 (*) 4.0 - 10.5 K/uL   RBC 4.29  3.87 - 5.11 MIL/uL   Hemoglobin 12.8  12.0 - 15.0 g/dL   HCT 38.7  36.0 - 46.0 %   MCV 90.2  78.0 - 100.0 fL   MCH 29.8  26.0 - 34.0 pg   MCHC 33.1  30.0 - 36.0 g/dL   RDW 15.4  11.5 - 15.5 %   Platelets 261  150 - 400 K/uL   Neutrophils Relative % 78 (*) 43 - 77 %   Neutro Abs 8.5 (*) 1.7 - 7.7 K/uL   Lymphocytes Relative 12  12 - 46 %   Lymphs Abs 1.3  0.7 - 4.0 K/uL   Monocytes Relative 8  3 - 12 %   Monocytes Absolute 0.9  0.1 - 1.0 K/uL   Eosinophils Relative 1  0 - 5 %   Eosinophils Absolute 0.1  0.0 - 0.7 K/uL   Basophils Relative 1  0 - 1 %   Basophils Absolute 0.1  0.0 - 0.1 K/uL   Smear Review Criteria for review not met    BASIC METABOLIC PANEL     Status: None   Collection Time    09/25/13 11:59 AM      Result Value Range   Sodium 142  135 - 145 mEq/L   Potassium 4.7  3.5 - 5.3 mEq/L   Chloride 102  96 - 112 mEq/L   CO2 25  19 - 32 mEq/L   Glucose, Bld 83  70 - 99 mg/dL   BUN 18  6 - 23 mg/dL   Creat 0.87  0.50 - 1.10 mg/dL   Calcium 9.2  8.4 - 10.5 mg/dL    Assessment/Plan: Acute bronchitis Rx Cefdinir.  Repeat CBC and BMET.    CHF exacerbation Improved.  Continue current regimen.  Repeat CBC and BMET.  Follow-up with new PCP in [place].

## 2013-10-01 NOTE — Assessment & Plan Note (Signed)
Improved.  Continue current regimen.  Repeat CBC and BMET.  Follow[place]up [place] new [place]CP in [place].

## 2013-10-02 NOTE — Telephone Encounter (Signed)
[facility]

## 2013-10-03 ENCOUNTER — Ambulatory Visit: Payer: Medicare Other | Admitting: Family Medicine

## 2013-10-03 ENCOUNTER — Other Ambulatory Visit: Payer: Self-pay | Admitting: Family Medicine

## 2013-10-06 ENCOUNTER — Ambulatory Visit: Payer: Medicare Other | Admitting: Family Medicine

## 2013-10-25 ENCOUNTER — Telehealth: Payer: Self-pay | Admitting: Family Medicine

## 2013-10-25 NOTE — Telephone Encounter (Signed)
Relevant patient education mailed to patient.  

## 2013-10-30 ENCOUNTER — Other Ambulatory Visit: Payer: Self-pay | Admitting: Family Medicine

## 2014-01-15 ENCOUNTER — Telehealth: Payer: Self-pay | Admitting: Family Medicine

## 2014-01-15 NOTE — Telephone Encounter (Signed)
Just FYI pt passed away on 05-Dec-2013

## 2014-01-15 NOTE — Telephone Encounter (Signed)
Please change status in EMR

## 2014-01-15 NOTE — Telephone Encounter (Signed)
Who does this go to?

## 2014-01-17 NOTE — Telephone Encounter (Signed)
Just an FYI for you 

## 2014-01-19 ENCOUNTER — Telehealth: Payer: Self-pay

## 2014-01-19 NOTE — Telephone Encounter (Signed)
[place] per Ileene Hutchinsonbituary

## 2014-01-26 DEATH — deceased

## 2014-01-29 NOTE — Progress Notes (Signed)
SPT note incomplete and to be deleted Delaney MeigsMaija Tabor Kijuana Ruppel, PT [phone]
